# Patient Record
Sex: Female | Born: 1965 | Race: White | Hispanic: No | Smoking: Current every day smoker
Health system: Southern US, Community
[De-identification: ages and names within clinical notes are randomized; demographics above are authoritative.]

## PROBLEM LIST (undated history)

## (undated) DIAGNOSIS — O99810 Abnormal glucose complicating pregnancy: Secondary | ICD-10-CM

## (undated) DIAGNOSIS — I219 Acute myocardial infarction, unspecified: Secondary | ICD-10-CM

## (undated) DIAGNOSIS — M549 Dorsalgia, unspecified: Secondary | ICD-10-CM

## (undated) DIAGNOSIS — F411 Generalized anxiety disorder: Secondary | ICD-10-CM

## (undated) DIAGNOSIS — K219 Gastro-esophageal reflux disease without esophagitis: Secondary | ICD-10-CM

## (undated) DIAGNOSIS — F410 Panic disorder [episodic paroxysmal anxiety] without agoraphobia: Secondary | ICD-10-CM

## (undated) DIAGNOSIS — F17200 Nicotine dependence, unspecified, uncomplicated: Secondary | ICD-10-CM

## (undated) DIAGNOSIS — G8929 Other chronic pain: Secondary | ICD-10-CM

## (undated) DIAGNOSIS — G43909 Migraine, unspecified, not intractable, without status migrainosus: Secondary | ICD-10-CM

## (undated) DIAGNOSIS — E785 Hyperlipidemia, unspecified: Secondary | ICD-10-CM

## (undated) HISTORY — DX: Hyperlipidemia, unspecified: E78.5

## (undated) HISTORY — PX: CHOLECYSTECTOMY: SHX55

## (undated) HISTORY — PX: TONSILLECTOMY: SUR1361

## (undated) HISTORY — DX: Gastro-esophageal reflux disease without esophagitis: K21.9

## (undated) HISTORY — DX: Nicotine dependence, unspecified, uncomplicated: F17.200

## (undated) HISTORY — DX: Generalized anxiety disorder: F41.1

## (undated) HISTORY — DX: Abnormal glucose complicating pregnancy: O99.810

## (undated) HISTORY — PX: RHINOPLASTY: SUR1284

## (undated) HISTORY — DX: Panic disorder (episodic paroxysmal anxiety): F41.0

## (undated) HISTORY — PX: ADENOIDECTOMY: SUR15

---

## 1999-11-29 ENCOUNTER — Emergency Department (HOSPITAL_COMMUNITY): Admission: EM | Admit: 1999-11-29 | Discharge: 1999-11-29 | Payer: Self-pay | Admitting: Emergency Medicine

## 1999-11-29 ENCOUNTER — Encounter: Payer: Self-pay | Admitting: Emergency Medicine

## 2000-09-15 ENCOUNTER — Observation Stay (HOSPITAL_COMMUNITY): Admission: RE | Admit: 2000-09-15 | Discharge: 2000-09-16 | Payer: Self-pay | Admitting: General Surgery

## 2000-09-15 ENCOUNTER — Encounter (INDEPENDENT_AMBULATORY_CARE_PROVIDER_SITE_OTHER): Payer: Self-pay | Admitting: Specialist

## 2002-06-08 ENCOUNTER — Encounter (INDEPENDENT_AMBULATORY_CARE_PROVIDER_SITE_OTHER): Payer: Self-pay | Admitting: *Deleted

## 2002-06-08 ENCOUNTER — Ambulatory Visit (HOSPITAL_COMMUNITY): Admission: AD | Admit: 2002-06-08 | Discharge: 2002-06-08 | Payer: Self-pay | Admitting: *Deleted

## 2002-06-08 ENCOUNTER — Encounter: Payer: Self-pay | Admitting: Obstetrics and Gynecology

## 2002-12-27 DIAGNOSIS — I219 Acute myocardial infarction, unspecified: Secondary | ICD-10-CM

## 2002-12-27 HISTORY — DX: Acute myocardial infarction, unspecified: I21.9

## 2003-09-03 ENCOUNTER — Encounter: Payer: Self-pay | Admitting: Obstetrics and Gynecology

## 2003-09-03 ENCOUNTER — Inpatient Hospital Stay (HOSPITAL_COMMUNITY): Admission: AD | Admit: 2003-09-03 | Discharge: 2003-09-07 | Payer: Self-pay | Admitting: Obstetrics and Gynecology

## 2003-09-04 ENCOUNTER — Encounter (INDEPENDENT_AMBULATORY_CARE_PROVIDER_SITE_OTHER): Payer: Self-pay

## 2003-09-08 ENCOUNTER — Encounter: Admission: RE | Admit: 2003-09-08 | Discharge: 2003-10-08 | Payer: Self-pay | Admitting: Obstetrics and Gynecology

## 2003-09-09 ENCOUNTER — Encounter: Payer: Self-pay | Admitting: Cardiology

## 2003-09-09 ENCOUNTER — Encounter: Payer: Self-pay | Admitting: Obstetrics and Gynecology

## 2003-09-09 ENCOUNTER — Inpatient Hospital Stay (HOSPITAL_COMMUNITY): Admission: AD | Admit: 2003-09-09 | Discharge: 2003-09-10 | Payer: Self-pay | Admitting: Pediatrics

## 2003-11-08 ENCOUNTER — Encounter: Admission: RE | Admit: 2003-11-08 | Discharge: 2003-12-08 | Payer: Self-pay | Admitting: Obstetrics and Gynecology

## 2003-11-12 ENCOUNTER — Other Ambulatory Visit: Admission: RE | Admit: 2003-11-12 | Discharge: 2003-11-12 | Payer: Self-pay | Admitting: Obstetrics and Gynecology

## 2004-01-08 ENCOUNTER — Encounter: Admission: RE | Admit: 2004-01-08 | Discharge: 2004-02-07 | Payer: Self-pay | Admitting: Obstetrics and Gynecology

## 2004-02-08 ENCOUNTER — Encounter: Admission: RE | Admit: 2004-02-08 | Discharge: 2004-03-09 | Payer: Self-pay | Admitting: Obstetrics and Gynecology

## 2004-04-07 ENCOUNTER — Encounter: Admission: RE | Admit: 2004-04-07 | Discharge: 2004-05-07 | Payer: Self-pay | Admitting: Obstetrics and Gynecology

## 2004-06-07 ENCOUNTER — Encounter: Admission: RE | Admit: 2004-06-07 | Discharge: 2004-07-07 | Payer: Self-pay | Admitting: Obstetrics and Gynecology

## 2004-08-07 ENCOUNTER — Encounter: Admission: RE | Admit: 2004-08-07 | Discharge: 2004-09-06 | Payer: Self-pay | Admitting: Obstetrics and Gynecology

## 2004-09-07 ENCOUNTER — Encounter: Admission: RE | Admit: 2004-09-07 | Discharge: 2004-10-07 | Payer: Self-pay | Admitting: Obstetrics and Gynecology

## 2004-11-30 ENCOUNTER — Ambulatory Visit: Payer: Self-pay | Admitting: Family Medicine

## 2004-12-31 ENCOUNTER — Other Ambulatory Visit: Admission: RE | Admit: 2004-12-31 | Discharge: 2004-12-31 | Payer: Self-pay | Admitting: Obstetrics & Gynecology

## 2005-02-01 ENCOUNTER — Ambulatory Visit: Payer: Self-pay | Admitting: *Deleted

## 2005-02-24 ENCOUNTER — Ambulatory Visit: Payer: Self-pay

## 2005-03-11 ENCOUNTER — Ambulatory Visit: Payer: Self-pay | Admitting: Family Medicine

## 2005-04-21 ENCOUNTER — Ambulatory Visit: Payer: Self-pay | Admitting: *Deleted

## 2005-04-30 ENCOUNTER — Encounter (INDEPENDENT_AMBULATORY_CARE_PROVIDER_SITE_OTHER): Payer: Self-pay | Admitting: *Deleted

## 2005-04-30 ENCOUNTER — Inpatient Hospital Stay (HOSPITAL_COMMUNITY): Admission: RE | Admit: 2005-04-30 | Discharge: 2005-05-03 | Payer: Self-pay | Admitting: Obstetrics & Gynecology

## 2005-05-18 ENCOUNTER — Ambulatory Visit: Payer: Self-pay | Admitting: Family Medicine

## 2005-06-02 ENCOUNTER — Other Ambulatory Visit: Admission: RE | Admit: 2005-06-02 | Discharge: 2005-06-02 | Payer: Self-pay | Admitting: Obstetrics & Gynecology

## 2005-10-05 ENCOUNTER — Ambulatory Visit: Payer: Self-pay | Admitting: Family Medicine

## 2006-03-04 ENCOUNTER — Ambulatory Visit: Payer: Self-pay | Admitting: Family Medicine

## 2006-06-06 ENCOUNTER — Ambulatory Visit: Payer: Self-pay | Admitting: Family Medicine

## 2006-07-29 ENCOUNTER — Ambulatory Visit: Payer: Self-pay | Admitting: Family Medicine

## 2006-08-01 ENCOUNTER — Ambulatory Visit: Payer: Self-pay | Admitting: Family Medicine

## 2006-08-17 ENCOUNTER — Emergency Department (HOSPITAL_COMMUNITY): Admission: EM | Admit: 2006-08-17 | Discharge: 2006-08-17 | Payer: Self-pay | Admitting: Family Medicine

## 2006-10-03 ENCOUNTER — Encounter (INDEPENDENT_AMBULATORY_CARE_PROVIDER_SITE_OTHER): Payer: Self-pay | Admitting: Specialist

## 2006-10-03 ENCOUNTER — Ambulatory Visit (HOSPITAL_COMMUNITY): Admission: RE | Admit: 2006-10-03 | Discharge: 2006-10-06 | Payer: Self-pay | Admitting: Obstetrics and Gynecology

## 2006-10-19 ENCOUNTER — Ambulatory Visit: Payer: Self-pay | Admitting: Family Medicine

## 2006-11-09 ENCOUNTER — Ambulatory Visit: Payer: Self-pay | Admitting: Family Medicine

## 2006-12-30 ENCOUNTER — Ambulatory Visit: Payer: Self-pay | Admitting: Family Medicine

## 2007-01-31 ENCOUNTER — Ambulatory Visit: Payer: Self-pay | Admitting: Family Medicine

## 2007-02-19 ENCOUNTER — Emergency Department (HOSPITAL_COMMUNITY): Admission: EM | Admit: 2007-02-19 | Discharge: 2007-02-19 | Payer: Self-pay | Admitting: Emergency Medicine

## 2007-02-23 ENCOUNTER — Ambulatory Visit: Payer: Self-pay | Admitting: Family Medicine

## 2007-05-18 ENCOUNTER — Ambulatory Visit: Payer: Self-pay | Admitting: Pulmonary Disease

## 2007-05-18 LAB — CONVERTED CEMR LAB
ALT: 14 units/L (ref 0–40)
Albumin: 4.2 g/dL (ref 3.5–5.2)
Alkaline Phosphatase: 70 units/L (ref 39–117)
BUN: 9 mg/dL (ref 6–23)
Basophils Absolute: 0.1 10*3/uL (ref 0.0–0.1)
Direct LDL: 122.1 mg/dL
GFR calc non Af Amer: 118 mL/min
HCT: 37.7 % (ref 36.0–46.0)
HDL: 57 mg/dL (ref >39.0)
Hemoglobin: 13 g/dL (ref 12.0–15.0)
Ketones, ur: NEGATIVE mg/dL
Lymphocytes Relative: 43.8 % (ref 12.0–46.0)
Neutro Abs: 3 10*3/uL (ref 1.4–7.7)
Nitrite: NEGATIVE
Potassium: 4.3 meq/L (ref 3.5–5.1)
RBC: 4.12 M/uL (ref 3.87–5.11)
Sodium: 139 meq/L (ref 135–145)
Specific Gravity, Urine: 1.01 (ref 1.000–1.03)
Total Protein, Urine: NEGATIVE mg/dL
Triglycerides: 152 mg/dL — ABNORMAL HIGH (ref 0–149)
VLDL: 30 mg/dL (ref 0–40)
WBC: 6.7 10*3/uL (ref 4.5–10.5)
pH: 6.5 (ref 5.0–8.0)

## 2007-09-16 ENCOUNTER — Emergency Department (HOSPITAL_COMMUNITY): Admission: EM | Admit: 2007-09-16 | Discharge: 2007-09-16 | Payer: Self-pay | Admitting: Family Medicine

## 2007-09-25 ENCOUNTER — Ambulatory Visit: Payer: Self-pay | Admitting: Pulmonary Disease

## 2007-10-10 ENCOUNTER — Emergency Department (HOSPITAL_COMMUNITY): Admission: EM | Admit: 2007-10-10 | Discharge: 2007-10-10 | Payer: Self-pay | Admitting: Emergency Medicine

## 2007-10-17 ENCOUNTER — Ambulatory Visit: Payer: Self-pay | Admitting: Pulmonary Disease

## 2007-10-17 DIAGNOSIS — G43109 Migraine with aura, not intractable, without status migrainosus: Secondary | ICD-10-CM

## 2007-10-17 DIAGNOSIS — F411 Generalized anxiety disorder: Secondary | ICD-10-CM

## 2007-10-17 DIAGNOSIS — F410 Panic disorder [episodic paroxysmal anxiety] without agoraphobia: Secondary | ICD-10-CM | POA: Insufficient documentation

## 2007-11-20 ENCOUNTER — Telehealth (INDEPENDENT_AMBULATORY_CARE_PROVIDER_SITE_OTHER): Payer: Self-pay | Admitting: *Deleted

## 2007-12-05 ENCOUNTER — Telehealth: Payer: Self-pay | Admitting: Pulmonary Disease

## 2007-12-11 ENCOUNTER — Ambulatory Visit: Payer: Self-pay | Admitting: Pulmonary Disease

## 2007-12-11 DIAGNOSIS — R030 Elevated blood-pressure reading, without diagnosis of hypertension: Secondary | ICD-10-CM

## 2007-12-11 DIAGNOSIS — Z8632 Personal history of gestational diabetes: Secondary | ICD-10-CM

## 2007-12-28 HISTORY — PX: ABDOMINAL HYSTERECTOMY: SHX81

## 2008-01-18 ENCOUNTER — Emergency Department (HOSPITAL_COMMUNITY): Admission: EM | Admit: 2008-01-18 | Discharge: 2008-01-18 | Payer: Self-pay | Admitting: Emergency Medicine

## 2008-03-06 ENCOUNTER — Encounter: Payer: Self-pay | Admitting: Pulmonary Disease

## 2008-03-27 ENCOUNTER — Other Ambulatory Visit: Admission: RE | Admit: 2008-03-27 | Discharge: 2008-03-27 | Payer: Self-pay | Admitting: Obstetrics and Gynecology

## 2008-04-01 ENCOUNTER — Telehealth: Payer: Self-pay | Admitting: Pulmonary Disease

## 2008-05-03 ENCOUNTER — Telehealth (INDEPENDENT_AMBULATORY_CARE_PROVIDER_SITE_OTHER): Payer: Self-pay | Admitting: *Deleted

## 2008-05-29 ENCOUNTER — Telehealth (INDEPENDENT_AMBULATORY_CARE_PROVIDER_SITE_OTHER): Payer: Self-pay | Admitting: *Deleted

## 2008-06-17 ENCOUNTER — Ambulatory Visit: Payer: Self-pay | Admitting: Pulmonary Disease

## 2008-06-23 LAB — CONVERTED CEMR LAB
Albumin: 3.9 g/dL (ref 3.5–5.2)
Alkaline Phosphatase: 62 units/L (ref 39–117)
BUN: 8 mg/dL (ref 6–23)
Basophils Absolute: 0.1 10*3/uL (ref 0.0–0.1)
Bilirubin, Direct: 0.1 mg/dL (ref 0.0–0.3)
CO2: 28 meq/L (ref 19–32)
Calcium: 8.8 mg/dL (ref 8.4–10.5)
Eosinophils Absolute: 0.2 10*3/uL (ref 0.0–0.7)
Eosinophils Relative: 3.4 % (ref 0.0–5.0)
Glucose, Bld: 93 mg/dL (ref 70–99)
HCT: 35.6 % — ABNORMAL LOW (ref 36.0–46.0)
Hemoglobin: 12 g/dL (ref 12.0–15.0)
Lymphocytes Relative: 37.3 % (ref 12.0–46.0)
MCV: 93.7 fL (ref 78.0–100.0)
Monocytes Relative: 10.7 % (ref 3.0–12.0)
Neutrophils Relative %: 47.7 % (ref 43.0–77.0)
Sodium: 139 meq/L (ref 135–145)
Total Bilirubin: 0.6 mg/dL (ref 0.3–1.2)
Valproic Acid Lvl: 34.1 ug/mL — ABNORMAL LOW (ref 50.0–100.0)

## 2008-06-24 ENCOUNTER — Telehealth (INDEPENDENT_AMBULATORY_CARE_PROVIDER_SITE_OTHER): Payer: Self-pay | Admitting: *Deleted

## 2008-08-26 ENCOUNTER — Telehealth (INDEPENDENT_AMBULATORY_CARE_PROVIDER_SITE_OTHER): Payer: Self-pay | Admitting: *Deleted

## 2008-09-11 ENCOUNTER — Telehealth: Payer: Self-pay | Admitting: Pulmonary Disease

## 2008-09-12 ENCOUNTER — Telehealth: Payer: Self-pay | Admitting: Pulmonary Disease

## 2008-10-04 ENCOUNTER — Telehealth (INDEPENDENT_AMBULATORY_CARE_PROVIDER_SITE_OTHER): Payer: Self-pay | Admitting: *Deleted

## 2008-11-27 ENCOUNTER — Telehealth (INDEPENDENT_AMBULATORY_CARE_PROVIDER_SITE_OTHER): Payer: Self-pay | Admitting: *Deleted

## 2008-12-02 ENCOUNTER — Telehealth (INDEPENDENT_AMBULATORY_CARE_PROVIDER_SITE_OTHER): Payer: Self-pay | Admitting: *Deleted

## 2009-01-07 ENCOUNTER — Telehealth (INDEPENDENT_AMBULATORY_CARE_PROVIDER_SITE_OTHER): Payer: Self-pay | Admitting: *Deleted

## 2009-02-05 ENCOUNTER — Telehealth (INDEPENDENT_AMBULATORY_CARE_PROVIDER_SITE_OTHER): Payer: Self-pay | Admitting: *Deleted

## 2009-02-21 ENCOUNTER — Ambulatory Visit: Payer: Self-pay | Admitting: Pulmonary Disease

## 2009-02-21 DIAGNOSIS — F17200 Nicotine dependence, unspecified, uncomplicated: Secondary | ICD-10-CM

## 2009-03-03 ENCOUNTER — Telehealth (INDEPENDENT_AMBULATORY_CARE_PROVIDER_SITE_OTHER): Payer: Self-pay | Admitting: *Deleted

## 2009-04-04 ENCOUNTER — Telehealth: Payer: Self-pay | Admitting: Pulmonary Disease

## 2009-04-04 ENCOUNTER — Encounter: Payer: Self-pay | Admitting: Pulmonary Disease

## 2009-04-30 ENCOUNTER — Telehealth (INDEPENDENT_AMBULATORY_CARE_PROVIDER_SITE_OTHER): Payer: Self-pay | Admitting: *Deleted

## 2009-05-04 ENCOUNTER — Emergency Department (HOSPITAL_COMMUNITY): Admission: EM | Admit: 2009-05-04 | Discharge: 2009-05-04 | Payer: Self-pay | Admitting: Emergency Medicine

## 2009-06-13 ENCOUNTER — Telehealth (INDEPENDENT_AMBULATORY_CARE_PROVIDER_SITE_OTHER): Payer: Self-pay | Admitting: *Deleted

## 2009-06-18 ENCOUNTER — Telehealth (INDEPENDENT_AMBULATORY_CARE_PROVIDER_SITE_OTHER): Payer: Self-pay | Admitting: *Deleted

## 2009-07-15 ENCOUNTER — Telehealth (INDEPENDENT_AMBULATORY_CARE_PROVIDER_SITE_OTHER): Payer: Self-pay | Admitting: *Deleted

## 2009-09-02 ENCOUNTER — Encounter: Payer: Self-pay | Admitting: Pulmonary Disease

## 2009-09-03 ENCOUNTER — Telehealth: Payer: Self-pay | Admitting: Pulmonary Disease

## 2009-11-11 ENCOUNTER — Ambulatory Visit: Payer: Self-pay | Admitting: Pulmonary Disease

## 2009-11-16 LAB — CONVERTED CEMR LAB
ALT: 16 units/L (ref 0–35)
AST: 18 units/L (ref 0–37)
Albumin: 3.7 g/dL (ref 3.5–5.2)
BUN: 7 mg/dL (ref 6–23)
Basophils Relative: 1 % (ref 0.0–3.0)
Chloride: 98 meq/L (ref 96–112)
Eosinophils Absolute: 0.2 10*3/uL (ref 0.0–0.7)
Eosinophils Relative: 2.4 % (ref 0.0–5.0)
HCT: 32.3 % — ABNORMAL LOW (ref 36.0–46.0)
Hemoglobin: 11.4 g/dL — ABNORMAL LOW (ref 12.0–15.0)
Lymphocytes Relative: 39 % (ref 12.0–46.0)
Monocytes Absolute: 0.8 10*3/uL (ref 0.1–1.0)
Monocytes Relative: 8.7 % (ref 3.0–12.0)
Platelets: 398 10*3/uL (ref 150.0–400.0)
RBC: 3.24 M/uL — ABNORMAL LOW (ref 3.87–5.11)
RDW: 13.5 % (ref 11.5–14.6)
Sed Rate: 8 mm/hr (ref 0–22)
TSH: 1.77 microintl units/mL (ref 0.35–5.50)
Total Bilirubin: 0.5 mg/dL (ref 0.3–1.2)
Total Protein: 6.5 g/dL (ref 6.0–8.3)
WBC: 9.3 10*3/uL (ref 4.5–10.5)

## 2010-02-28 ENCOUNTER — Emergency Department (HOSPITAL_COMMUNITY): Admission: EM | Admit: 2010-02-28 | Discharge: 2010-03-01 | Payer: Self-pay | Admitting: Emergency Medicine

## 2010-04-03 ENCOUNTER — Telehealth (INDEPENDENT_AMBULATORY_CARE_PROVIDER_SITE_OTHER): Payer: Self-pay | Admitting: *Deleted

## 2010-04-27 ENCOUNTER — Ambulatory Visit: Payer: Self-pay | Admitting: Pulmonary Disease

## 2010-10-16 ENCOUNTER — Telehealth (INDEPENDENT_AMBULATORY_CARE_PROVIDER_SITE_OTHER): Payer: Self-pay | Admitting: *Deleted

## 2010-10-26 ENCOUNTER — Ambulatory Visit: Payer: Self-pay | Admitting: Pulmonary Disease

## 2010-11-26 HISTORY — PX: BILATERAL SALPINGOOPHORECTOMY: SHX1223

## 2010-12-25 ENCOUNTER — Encounter: Payer: Self-pay | Admitting: Obstetrics and Gynecology

## 2010-12-25 ENCOUNTER — Ambulatory Visit (HOSPITAL_COMMUNITY)
Admission: RE | Admit: 2010-12-25 | Discharge: 2010-12-26 | Payer: Self-pay | Source: Home / Self Care | Attending: Obstetrics and Gynecology | Admitting: Obstetrics and Gynecology

## 2011-01-26 NOTE — Assessment & Plan Note (Signed)
Summary: 6 month follow up/la   CC:  6 month follow up--not fasting today--needs refill of soma.  History of Present Illness: 45 y/o WF here for a follow up visit...    ~  seen 12/08 for f/u of her borderline HBP, headaches, & anxiety disorder w/ panic attacks... we refilled Fioricet, Phenergan, and Alprazolam at that time... since then she has f/u w/ DrAdelman et al Mar09 who tried her on Keppra, Baclofen, and Chlorpromazine... she tells me this didn't work and he couldn't help her and suggested that she go to a psychiatrist... she saw DrCunningham of [facility] in April and he started Depakote for migraine prevention and since then she is much improved...   ~  seen Jun09 for a medical follow up and she requests refills for Alprazolam, Soma, and Darvocet... she states that DrCunningham wanted me to write for her nerve pill and he agreed w/ the Rebeca Allegra (although he didn't want to write for it or change it)... she states that she needs the [facility] for muscle spasms in her neck and lower back, and she needs the [facility] for tension headaches and pain... we discussed all of this and I agreed to write for #60 of each- not to exceed 2 per day, and she has agreed not to fill similiar meds from other physicians or at different pharms...   ~  February 21, 2009:  since her last OV her headaches have returned but still not as bad as before thanks to the Depakote from DrCunningham... the DCN100 didn't work and she was changed to [employer] at her request... now she is requesting a change from this to something else & we have settled on VICODIN twice daily Prn (#60/mo not to exceed 2/d)...   ~  November 11, 2009:  late for 4mo ROV required for Rx refills... states she quit smoking 2-3 mo ago, HA's much improved w/ 3,500mg  Depakote Qhs per DrCunningham, but she wants all meds refilled as usual (limited to #60 per month)...  New Problem= hidradenitis right axilla (Rx w/ Doxy + topical Rx)...   ~  Apr 27, 2010:  6  mo ROV to refill meds- states she is doing well w/ the Soma/ Vicodin/ Phenergam for her migraines & still on Depakote from [employer], Psychiatry...  using the Alprazolam 1mg  Bid to prevent panic attacks...  she has lost 12# on diet & exercise program;  BP is improved w/ wt loss;  still not smoking, she says;  & under mod stress w/ divorce...   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  ~  CXR 5/08 showed sl incr markings, NAD.Marland Kitchen.  ~  CXR 11/10 showed similar, NAD...  ELEVATED BP READING WITHOUT DX HYPERTENSION (ICD-796.2) - Hx pulm edema after CSection in 2004 w/ neg spiral CT;  2DEcho 3/06 showed prob redundant MV chordae, trace MR, nl LVF... BP's have been fine here in the office and measures 116/64 today (after her 12# wt loss to 171#)... denies visual changes, CP, palipit, dizziness, syncope, dyspnea, edema, etc...  MIGRAINES, HX OF (ICD-V13.8) - long hx of migraines, mixed headaches, & chr daily HA's... prev managed by [facility] for yrs- she stopped seeing him & is now managed by [facility]- prev DrCunningham, now DrKaur> on Depakote... + SOMA Bid, VICODIN up to Tid, PHENERGAN Prn.  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 PANIC ATTACK (ICD-300.01)  Hx of GESTATIONAL DIABETES (ICD-648.80) GYN =  DrMcPhail now... hx endometriosis w/ shots per DrGottsegen previously... she blames 20# wt gain on the shots that weren't helping her pelvic pain problem anyway so she switched to new GYN in [place]...   Allergies (verified): No Known Drug Allergies  Comments:  Nurse/Medical Assistant: The patient's medications and allergies were reviewed with the patient and were updated in the Medication and Allergy Lists.  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 Father alive, age 12 w/ CABG, hx migraines... Mother alive, age 24 w/ hx asthma, CHF... 1 Sibling- hx migraines as well...  Social History: Reviewed history from 11/11/2009 and no changes required. Married Children Smoker- states she quit 9/10... Social alcohol Unemployed  Review of Systems      See HPI  The patient denies anorexia, fever, weight loss, weight gain, vision loss, decreased hearing, hoarseness, chest pain, syncope, dyspnea on exertion, peripheral edema, prolonged cough, headaches, hemoptysis, abdominal pain, melena, hematochezia, severe indigestion/heartburn, hematuria, incontinence, muscle weakness, suspicious skin lesions, transient blindness, difficulty walking, depression, unusual weight change, abnormal bleeding, enlarged lymph nodes, and angioedema.    Vital Signs:  Patient profile:   45 year old female Height:      65 inches Weight:      170.50 pounds BMI:     28.48 O2 Sat:      100 % on Room air Temp:     98.2 degrees F oral Pulse rate:   92 / minute BP sitting:   116 / 64  (left arm) Cuff size:   regular  Vitals Entered By: Randell Loop CMA (Apr 27, 2010 10:37 AM)  O2 Sat at Rest %:  100 O2 Flow:  Room air CC: 6 month follow up--not fasting today--needs refill of soma Is Patient Diabetic? No Pain Assessment Patient in pain? no      Comments meds updated today   Physical Exam  Additional Exam:  WD WN 43 WF in NAD... GENERAL:  Alert & oriented; pleasant & cooperative... HEENT:  [place]/AT, EOM-wnl, PERRLA, EACs-clear, TMs-wnl, NOSE-clear, THROAT-clear & wnl. NECK:  Supple w/ fairROM; no JVD; normal carotid impulses w/o bruits; no thyromegaly or nodules palpated; no lymphadenopathy. CHEST:   Clear to P & A; without wheezes/ rales/ or rhonchi. HEART:  Regular Rhythm; without murmurs/ rubs/ or gallops. ABDOMEN:  Soft & nontender; normal bowel sounds; no organomegaly or masses detected. EXT: without deformities or arthritic changes; no varicose veins/ venous insuffic/ or edema. NEURO:  CN's intact;  no focal neuro deficits... DERM:  No lesions noted; no rash etc...    Impression & Recommendations:  Problem # 1:  CIGARETTE SMOKER (ICD-305.1) She states that she has remained off cigs sine 9/10...  Problem # 2:  ELEVATED BP READING WITHOUT DX HYPERTENSION (ICD-796.2) BP improved-  no meds, w/ weight reduction...  Problem # 3:  MIGRAINES, HX OF (ICD-V13.8) Controlled on regimen... she wishes to continue the same for now... advised to keep HA diary as before... Orders: Prescription Created Electronically [phone])  Problem # 4:  PANIC ATTACK (ICD-300.01) Controlled on the Alprazolam... Her updated medication list for this problem includes:    Alprazolam 1 Mg Tabs (Alprazolam) .Marland Kitchen... 1 tab twice daily as needed for nerves (not to exceed 2 per day).  Complete Medication List: 1)  Depakote 500 Mg Tbec (Divalproex sodium) .... Take as directed by drcunningham 2)  Soma 350 Mg Tabs (Carisoprodol) .... Take 1 tab by mouth two times a day as needed for muscle spasm- not to exceed 2 per day... 3)  Alprazolam 1 Mg Tabs (Alprazolam) .Marland Kitchen.. 1 tab twice daily as needed for nerves (not to exceed 2 per day). 4)  Hydrocodone-acetaminophen 5-500 Mg Tabs (Hydrocodone-acetaminophen) .... Take 1 tablet by mouth three times per day as needed for pain -  not to exceed 3  per day. 5)  Promethazine Hcl 25 Mg Tabs (Promethazine hcl) .... Take 1 tab by mouth every 8-12 hours as needed for nausea... not to exceed 2 per day.  Patient Instructions: 1)  Today we updated your med list- see below.... 2)  We refilled your meds for the next 6 months... 3)  Great job on [employer] reduction!!! Keep up the good  work... 4)  Congrats on the smoking cessation as well!!! 5)  Call for any problems.Marland KitchenMarland Kitchen 6)  Please schedule a follow-up appointment in 6 months & we will plan follow up CXR & blood work (FASTING) at that time... Prescriptions: PROMETHAZINE HCL 25 MG TABS (PROMETHAZINE HCL) take 1 tab by mouth every 8-12 hours as needed for nausea... not to exceed 2 per day.  #60 x 6   Entered and Authorized by:   Michele Mcalpine MD   Signed by:   Michele Mcalpine MD on 04/27/2010   Method used:   Print then Give to Patient   RxID:   [id number] HYDROCODONE-ACETAMINOPHEN 5-500 MG TABS (HYDROCODONE-ACETAMINOPHEN) take 1 tablet by mouth three times per day as needed for pain -  NOT TO EXCEED 3  PER DAY.  #90 x 6   Entered and Authorized by:   Michele Mcalpine MD   Signed by:   Michele Mcalpine MD on 04/27/2010   Method used:   Print then Give to Patient   RxID:   [id number] ALPRAZOLAM 1 MG  TABS (ALPRAZOLAM) 1 tab twice daily as needed for nerves (not to exceed 2 per day).  #60 x 6   Entered and Authorized by:   Michele Mcalpine MD   Signed by:   Michele Mcalpine MD on 04/27/2010   Method used:   Print then Give to Patient   RxID:   [id number] SOMA 350 MG  TABS (CARISOPRODOL) take 1 tab by mouth two times a day as needed for muscle spasm- not to exceed 2 per day...  #60 x 6   Entered and Authorized by:   Michele Mcalpine MD   Signed by:   Michele Mcalpine MD on 04/27/2010   Method used:   Print then Give to Patient   RxID:   [id number]    Immunization History:  Influenza Immunization History:    Influenza:  historical (12/09/2009)

## 2011-01-26 NOTE — Progress Notes (Signed)
Summary: refill  Phone Note Call from Patient Call back at [phone]   Caller: Patient Call For: nadel Reason for Call: Refill Medication Summary of Call: Need refill on vicodin 5/500.//[facility] [address] Initial call taken by: Darletta Moll,  October 16, 2010 1:37 PM  Follow-up for Phone Call        per sn ok x 1 refill--last refill was 09/19/10--lmom for pt that this med was called in Follow-up by: Annlee Glandon CMA,  October 16, 2010 2:52 PM

## 2011-01-26 NOTE — Progress Notes (Signed)
Summary: MEDICATION CHANGE  Phone Note Call from Patient Call back at [phone]   Caller: Patient Call For: NADEL Summary of Call: PT WOULD  LIKE HYDROCODONE CHANGE BACK TO 90MG   BECAUSE SHE IS GOING THROUGH DIVORCE. Initial call taken by: Rickard Patience,  April 03, 2010 11:13 AM  Follow-up for Phone Call        Marliss Czar, please advise if this is okay from [facility] as pt is monitored for her narcotics. Reynaldo Minium CMA  April 03, 2010 12:03 PM    called and spoke with pt and she is aware per SN---that the vicodin refills from before have been cancelled and the new rx has been sent in for #90 1 three times a day as needed with no refills---and pt made appt for may 2 at 10:30 Randell Loop CMA  April 03, 2010 4:39 PM        New/Updated Medications: HYDROCODONE-ACETAMINOPHEN 5-500 MG TABS (HYDROCODONE-ACETAMINOPHEN) take 1 tablet by mouth three times per day as needed for pain -  NOT TO EXCEED 3  PER DAY. Prescriptions: HYDROCODONE-ACETAMINOPHEN 5-500 MG TABS (HYDROCODONE-ACETAMINOPHEN) take 1 tablet by mouth three times per day as needed for pain -  NOT TO EXCEED 3  PER DAY.  #90 x 0   Entered by:   Randell Loop CMA   Authorized by:   Michele Mcalpine MD   Signed by:   Randell Loop CMA on 04/03/2010   Method used:   Historical   RxID:   [id number]

## 2011-01-26 NOTE — Assessment & Plan Note (Signed)
Summary: 6 months f/u w/ cxr/ fasting labs//kp   CC:  6 month ROV & review> not fasting today and needs meds refilled....  History of Present Illness: 45 y/o WF here for a follow up visit...    ~  seen 12/08 for f/u of her borderline HBP, headaches, & anxiety disorder w/ panic attacks... we refilled Fioricet, Phenergan, and Alprazolam at that time... since then she has f/u w/ DrAdelman et al Mar09 who tried her on Keppra, Baclofen, and Chlorpromazine... she tells me this didn't work and he couldn't help her and suggested that she go to a psychiatrist... she saw DrCunningham of [facility] in April and he started Depakote for migraine prevention and since then she is much improved...   ~  seen Jun09 for a medical follow up and she requests refills for Alprazolam, Soma, and Darvocet... she states that DrCunningham wanted me to write for her nerve pill and he agreed w/ the Rebeca Allegra (although he didn't want to write for it or change it)... she states that she needs the [facility] for muscle spasms in her neck and lower back, and she needs the [facility] for tension headaches and pain... we discussed all of this and I agreed to write for #60 of each- not to exceed 2 per day, and she has agreed not to fill similiar meds from other physicians or at different pharms...   ~  February 21, 2009:  since her last OV her headaches have returned but still not as bad as before thanks to the Depakote from DrCunningham... the DCN100 didn't work and she was changed to [employer] at her request... now she is requesting a change from this to something else & we have settled on VICODIN twice daily Prn (#60/mo not to exceed 2/d)...   ~  November 11, 2009:  late for 59mo ROV required for Rx refills... states she quit smoking 2-3 mo ago, HA's much improved w/ 3,500mg  Depakote Qhs per DrCunningham, but she wants all meds refilled as usual (limited to #60 per month)...  New Problem= hidradenitis right axilla (Rx w/ Doxy + topical  Rx)...   ~  Apr 27, 2010:  6 mo ROV to refill meds- states she is doing well w/ the Soma/ Vicodin/ Phenergan for her migraines & still on Depakote from [employer], Psychiatry...  using the Alprazolam 1mg  Bid to prevent panic attacks...  she has lost 12# on diet & exercise program;  BP is improved w/ wt loss;  still not smoking, she says;  & under mod stress w/ divorce...   ~  October 26, 2010:  59mo ROV & med refills- migraines decr from 2-3/mo to just one in the last 71mo on the Depakote from [facility], & Vicodin, Soma, Phenergan from me (still takes these regularly for her more minor HA pain she says)... she quit smoking 7/11, lost 4# more on diet + walking... BP controlled;  wants to wait til spring for Fasting labs;  refuses flu shot...   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  ~  CXR 5/08 showed sl incr markings, NAD.Marland Kitchen.  ~  CXR 11/10 showed similar, NAD...  ELEVATED BP READING WITHOUT DX HYPERTENSION (ICD-796.2) - Hx pulm edema after CSection in 2004 w/ neg spiral CT;  2DEcho 3/06 showed prob redundant MV chordae, trace MR, nl LVF... BP's have been fine here in the office and measures 122/84 today (after her wt loss to 167#)... denies visual changes, CP, palipit,  dizziness, syncope, dyspnea, edema, etc...  MIGRAINES, HX OF (ICD-V13.8) - long hx of migraines, mixed headaches, & chr daily HA's... prev managed by [facility] for yrs- she stopped seeing him & is now managed by [facility]- prev DrCunningham, now DrKaur> on Depakote... + SOMA Bid, VICODIN up to Tid, PHENERGAN Prn.  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 PANIC ATTACK (ICD-300.01)  Hx of GESTATIONAL DIABETES (ICD-648.80) GYN = DrMcPhail now... hx endometriosis w/ shots per DrGottsegen previously... she blames 20# wt gain on the shots that weren't helping her pelvic pain problem anyway so she switched to  new GYN in [place]...   Preventive Screening-Counseling & Management  Alcohol-Tobacco     Smoking Status: quit     Year Quit: 06/2010  Allergies (verified): No Known Drug Allergies  Comments:  Nurse/Medical Assistant: The patient's medications and allergies were reviewed with the patient and were updated in the Medication and Allergy Lists.  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 Father alive, age 52 w/ CABG, hx migraines... Mother alive, age 59 w/ hx asthma, CHF... 1 Sibling- hx migraines as well...  Social History: Reviewed history from 11/11/2009 and no changes required. Married Children Smoker- states she quit 9/10... Social alcohol Unemployed Smoking Status:  quit  Review of Systems      See HPI  The patient denies anorexia, fever, weight loss, weight gain, vision loss, decreased hearing, hoarseness, chest pain, syncope, dyspnea on exertion, peripheral edema, prolonged cough, headaches, hemoptysis, abdominal pain, melena, hematochezia, severe indigestion/heartburn, hematuria, incontinence, muscle weakness, suspicious skin lesions, transient blindness, difficulty walking, depression, unusual weight change, abnormal bleeding, enlarged lymph nodes, and angioedema.    Vital Signs:  Patient profile:   45 year old female Height:      65 inches Weight:      167.13 pounds BMI:     27.91 O2 Sat:      95 % on Room air Temp:     97.8 degrees F oral Pulse rate:   96 / minute BP sitting:   122 / 84  (right arm) Cuff size:   regular  Vitals Entered By: Randell Loop CMA (October 26, 2010 9:15 AM)  O2 Sat at  Rest %:  95 O2 Flow:  Room air CC: 6 month ROV & review> not fasting today, needs meds refilled... Is Patient Diabetic? No Pain Assessment Patient in pain? no      Comments no changes in meds today   Physical Exam  Additional Exam:  WD WN 44 WF in NAD... GENERAL:  Alert & oriented; pleasant & cooperative... HEENT:  [place]/AT, EOM-wnl, PERRLA, EACs-clear, TMs-wnl, NOSE-clear, THROAT-clear & wnl. NECK:  Supple w/ fairROM; no JVD; normal carotid impulses w/o bruits; no thyromegaly or nodules palpated; no lymphadenopathy. CHEST:  Clear to P & A; without wheezes/ rales/ or rhonchi. HEART:  Regular Rhythm; without murmurs/ rubs/ or gallops. ABDOMEN:  Soft & nontender; normal bowel sounds; no organomegaly or masses detected. EXT: without deformities or arthritic changes; no varicose veins/ venous insuffic/ or edema. NEURO:  CN's intact;  no focal neuro deficits... DERM:  No lesions noted; no rash etc...    Impression & Recommendations:  Problem # 1:  MIGRAINES,  HX OF (ICD-V13.8) She has severe HA problem>  migraines, chre daily HA, etc... migraines decr w/ the depakote per [facility]... chr daily HAs managed w/ Alpraz Bid, Soma Bid, Vicodin Bid, & Prn Phenergan... she wishes to continue as is...  Problem # 2:  CIGARETTE SMOKER (ICD-305.1) She states her last cig was 7/11...  Problem # 3:  ELEVATED BP READING WITHOUT DX HYPERTENSION (ICD-796.2) BP controlled on current Rx for HAs & nerves... she knows to elim sodium etc...  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 Her updated medication list for this problem includes:    Alprazolam 1 Mg Tabs (Alprazolam) .Marland Kitchen... 1 tab twice daily as needed for nerves (not to exceed 2 per day).  Problem # 5:  OTHER MEDICAL PROBLEMS AS NOTED>>>  Complete Medication List: 1)  Depakote 500 Mg Tbec (Divalproex sodium) .... Take as directed by drcunningham 2)  Soma 350 Mg Tabs (Carisoprodol) .... Take 1 tab  by mouth two times a day as needed for muscle spasm- not to exceed 2 per day... 3)  Alprazolam 1 Mg Tabs (Alprazolam) .Marland Kitchen.. 1 tab twice daily as needed for nerves (not to exceed 2 per day). 4)  Hydrocodone-acetaminophen 5-500 Mg Tabs (Hydrocodone-acetaminophen) .... Take 1 tablet by mouth three times per day as needed for pain -  not to exceed 3  per day. 5)  Promethazine Hcl 25 Mg Tabs (Promethazine hcl) .... Take 1 tab by mouth every 8-12 hours as needed for nausea... not to exceed 2 per day.  Patient Instructions: 1)  Today we updated your med list- see below.... 2)  We refilled your perscriptions for the next 88mo... 3)  Let's plan a follow up visit in 88mo w/ CXR & FASTING blood work at that time... Prescriptions: PROMETHAZINE HCL 25 MG TABS (PROMETHAZINE HCL) take 1 tab by mouth every 8-12 hours as needed for nausea... not to exceed 2 per day.  #60 x 6   Entered and Authorized by:   Michele Mcalpine MD   Signed by:   Michele Mcalpine MD on 10/26/2010   Method used:   Print then Give to Patient   RxID:   [id number] HYDROCODONE-ACETAMINOPHEN 5-500 MG TABS (HYDROCODONE-ACETAMINOPHEN) take 1 tablet by mouth three times per day as needed for pain -  NOT TO EXCEED 3  PER DAY.  #90 x 6   Entered and Authorized by:   Michele Mcalpine MD   Signed by:   Michele Mcalpine MD on 10/26/2010   Method used:   Print then Give to Patient   RxID:   [id number] ALPRAZOLAM 1 MG  TABS (ALPRAZOLAM) 1 tab twice daily as needed for nerves (not to exceed 2 per day).  #60 x 6   Entered and Authorized by:   Michele Mcalpine MD   Signed by:   Michele Mcalpine MD on 10/26/2010   Method used:   Print then Give to Patient   RxID:   [id number] SOMA 350 MG  TABS (CARISOPRODOL) take 1 tab by mouth two times a day as needed for muscle spasm- not to exceed 2 per day...  #60 x 6   Entered and Authorized by:   Michele Mcalpine MD   Signed by:   Michele Mcalpine MD on 10/26/2010   Method used:   Print then Give to Patient   RxID:    [id number]

## 2011-03-08 LAB — COMPREHENSIVE METABOLIC PANEL
AST: 29 U/L (ref 0–37)
CO2: 27 mEq/L (ref 19–32)
Calcium: 9.2 mg/dL (ref 8.4–10.5)
Chloride: 106 mEq/L (ref 96–112)
Creatinine, Ser: 0.73 mg/dL (ref 0.4–1.2)
GFR calc non Af Amer: >60 mL/min (ref >60)
Glucose, Bld: 86 mg/dL (ref 70–99)

## 2011-03-08 LAB — CBC
HCT: 32 % — ABNORMAL LOW (ref 36.0–46.0)
HCT: 38.4 % (ref 36.0–46.0)
Hemoglobin: 10.7 g/dL — ABNORMAL LOW (ref 12.0–15.0)
MCH: 34.1 pg — ABNORMAL HIGH (ref 26.0–34.0)
MCH: 34.2 pg — ABNORMAL HIGH (ref 26.0–34.0)
MCV: 102.2 fL — ABNORMAL HIGH (ref 78.0–100.0)
RBC: 3.13 MIL/uL — ABNORMAL LOW (ref 3.87–5.11)
RBC: 3.78 MIL/uL — ABNORMAL LOW (ref 3.87–5.11)
RDW: 13.9 % (ref 11.5–15.5)

## 2011-03-08 LAB — SURGICAL PCR SCREEN: Staphylococcus aureus: NEGATIVE

## 2011-03-19 LAB — COMPREHENSIVE METABOLIC PANEL
AST: 21 U/L (ref 0–37)
Alkaline Phosphatase: 49 U/L (ref 39–117)
CO2: 24 mEq/L (ref 19–32)
Calcium: 8.5 mg/dL (ref 8.4–10.5)
Chloride: 107 mEq/L (ref 96–112)
Creatinine, Ser: 0.73 mg/dL (ref 0.4–1.2)
Glucose, Bld: 69 mg/dL — ABNORMAL LOW (ref 70–99)
Potassium: 3.5 mEq/L (ref 3.5–5.1)
Sodium: 138 mEq/L (ref 135–145)
Total Protein: 6.1 g/dL (ref 6.0–8.3)

## 2011-03-19 LAB — CBC
Hemoglobin: 11.7 g/dL — ABNORMAL LOW (ref 12.0–15.0)
RBC: 3.33 MIL/uL — ABNORMAL LOW (ref 3.87–5.11)

## 2011-03-19 LAB — ETHANOL: Alcohol, Ethyl (B): <5 mg/dL (ref 0–10)

## 2011-03-19 LAB — RAPID URINE DRUG SCREEN, HOSP PERFORMED
Benzodiazepines: POSITIVE — AB
Cocaine: NOT DETECTED

## 2011-03-19 LAB — ACETAMINOPHEN LEVEL: Acetaminophen (Tylenol), Serum: <10 ug/mL — ABNORMAL LOW (ref 10–30)

## 2011-03-19 LAB — DIFFERENTIAL
Basophils Relative: 1 % (ref 0–1)
Eosinophils Absolute: 0.1 10*3/uL (ref 0.0–0.7)
Eosinophils Relative: 1 % (ref 0–5)

## 2011-03-19 LAB — VALPROIC ACID LEVEL: Valproic Acid Lvl: 80.7 ug/mL (ref 50.0–100.0)

## 2011-04-06 LAB — POCT I-STAT, CHEM 8
BUN: 6 mg/dL (ref 6–23)
Creatinine, Ser: 1 mg/dL (ref 0.4–1.2)
Potassium: 4 mEq/L (ref 3.5–5.1)
Sodium: 140 mEq/L (ref 135–145)
TCO2: 25 mmol/L (ref 0–100)

## 2011-04-06 LAB — URINALYSIS, ROUTINE W REFLEX MICROSCOPIC
Glucose, UA: NEGATIVE mg/dL
Hgb urine dipstick: NEGATIVE
Urobilinogen, UA: 1 mg/dL (ref 0.0–1.0)

## 2011-04-06 LAB — VALPROIC ACID LEVEL: Valproic Acid Lvl: 69 ug/mL (ref 50.0–100.0)

## 2011-04-06 LAB — URINE CULTURE: Culture: NO GROWTH

## 2011-04-16 ENCOUNTER — Encounter: Payer: Self-pay | Admitting: Pulmonary Disease

## 2011-04-20 ENCOUNTER — Other Ambulatory Visit (INDEPENDENT_AMBULATORY_CARE_PROVIDER_SITE_OTHER): Payer: 59

## 2011-04-20 ENCOUNTER — Ambulatory Visit (INDEPENDENT_AMBULATORY_CARE_PROVIDER_SITE_OTHER)
Admission: RE | Admit: 2011-04-20 | Discharge: 2011-04-20 | Disposition: A | Discharge status: Home / Self Care | Payer: 59 | Source: Ambulatory Visit | Attending: Pulmonary Disease | Admitting: Pulmonary Disease

## 2011-04-20 ENCOUNTER — Encounter: Payer: Self-pay | Admitting: Pulmonary Disease

## 2011-04-20 ENCOUNTER — Ambulatory Visit (INDEPENDENT_AMBULATORY_CARE_PROVIDER_SITE_OTHER): Payer: 59 | Admitting: Pulmonary Disease

## 2011-04-20 ENCOUNTER — Other Ambulatory Visit (INDEPENDENT_AMBULATORY_CARE_PROVIDER_SITE_OTHER): Payer: 59 | Admitting: Pulmonary Disease

## 2011-04-20 VITALS — BP 110/82 | HR 74 | Temp 97.8°F | Ht 65.0 in | Wt 166.8 lb

## 2011-04-20 DIAGNOSIS — Z87898 Personal history of other specified conditions: Secondary | ICD-10-CM

## 2011-04-20 DIAGNOSIS — Z0000 Encounter for general adult medical examination without abnormal findings: Secondary | ICD-10-CM

## 2011-04-20 DIAGNOSIS — J209 Acute bronchitis, unspecified: Secondary | ICD-10-CM

## 2011-04-20 DIAGNOSIS — E785 Hyperlipidemia, unspecified: Secondary | ICD-10-CM | POA: Insufficient documentation

## 2011-04-20 DIAGNOSIS — F411 Generalized anxiety disorder: Secondary | ICD-10-CM

## 2011-04-20 LAB — LIPID PANEL
Cholesterol: 241 mg/dL — ABNORMAL HIGH (ref 0–200)
HDL: 58.3 mg/dL (ref >39.00)
Total CHOL/HDL Ratio: 4
Triglycerides: 248 mg/dL — ABNORMAL HIGH (ref 0.0–149.0)

## 2011-04-20 LAB — HEPATIC FUNCTION PANEL
Albumin: 3.8 g/dL (ref 3.5–5.2)
Bilirubin, Direct: 0 mg/dL (ref 0.0–0.3)
Total Protein: 6.8 g/dL (ref 6.0–8.3)

## 2011-04-20 LAB — BASIC METABOLIC PANEL
CO2: 30 mEq/L (ref 19–32)
Calcium: 9.3 mg/dL (ref 8.4–10.5)
Creatinine, Ser: 0.6 mg/dL (ref 0.4–1.2)
GFR: 119.76 mL/min (ref >60.00)
Glucose, Bld: 78 mg/dL (ref 70–99)
Sodium: 137 mEq/L (ref 135–145)

## 2011-04-20 LAB — CBC WITH DIFFERENTIAL/PLATELET
Basophils Relative: 0.5 % (ref 0.0–3.0)
HCT: 36.5 % (ref 36.0–46.0)
Hemoglobin: 12.7 g/dL (ref 12.0–15.0)
Lymphocytes Relative: 49.2 % — ABNORMAL HIGH (ref 12.0–46.0)
Lymphs Abs: 4.3 10*3/uL — ABNORMAL HIGH (ref 0.7–4.0)
Monocytes Relative: 9.5 % (ref 3.0–12.0)
Neutro Abs: 3.4 10*3/uL (ref 1.4–7.7)
RBC: 3.62 Mil/uL — ABNORMAL LOW (ref 3.87–5.11)

## 2011-04-20 MED ORDER — AMOXICILLIN-POT CLAVULANATE 875-125 MG PO TABS: 1.0000 | ORAL_TABLET | Freq: Two times a day (BID) | ORAL | Status: AC

## 2011-04-20 MED ORDER — CARISOPRODOL 350 MG PO TABS: 350.0000 mg | ORAL_TABLET | Freq: Two times a day (BID) | ORAL | Status: DC | PRN

## 2011-04-20 MED ORDER — ALPRAZOLAM 1 MG PO TABS: 1.0000 mg | ORAL_TABLET | Freq: Two times a day (BID) | ORAL | Status: DC | PRN

## 2011-04-20 MED ORDER — METHYLPREDNISOLONE ACETATE 80 MG/ML IJ SUSP
80.0000 mg | Freq: Once | INTRAMUSCULAR | Status: AC
  Administered 2011-04-20: 80 mg via INTRA_ARTICULAR

## 2011-04-20 MED ORDER — HYDROCODONE-ACETAMINOPHEN 5-500 MG PO TABS: 1.0000 | ORAL_TABLET | Freq: Three times a day (TID) | ORAL | Status: DC | PRN

## 2011-04-20 NOTE — Progress Notes (Signed)
Subjective:    Patient ID: [name], female    DOB: 1966/01/04, 45 y.o.   MRN: [id number]  HPI 45 y/o WF here for a follow up visit... She has mult medical problems as noted below>  ~  October 26, 2010:  45mo ROV & med refills- migraines decr from 2-3/mo to just one in the last 2766mo on the Depakote from [facility], & Vicodin, Soma, Phenergan from me (still takes these regularly for her more minor HA pain she says)... she quit smoking 7/11, lost 4# more on diet + walking... BP controlled;  wants to wait til spring for Fasting labs;  refuses flu shot...  ~  April 20, 2011:  45mo ROV for med refills & her yearly CPX> c/o sinus infection w/ headache, yellow drainage, congestion etc; states it's gone to her chest w/ bronchitis- cough, thick beige sputum, low grade temp, etc; we discussed Rx w/ Depo/ Augmentin/ Mucinex/ etc...   States she quit smoking about 1 yr ago w/ a promise to her 54 y/o child, weight stable...  She tells me she had a BSO & removal of scar tissue from GYN DrBernardo 12/11...  Due for CXR & fasting blood work today> see below...         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 ~  CXR 5/08 showed sl incr markings, NAD.Marland Kitchen. ~  CXR 11/10 showed similar, NAD.Marland Kitchen. ~  CXR 4/12 showed sl incr markings & NAD...  ELEVATED BP READING WITHOUT DX HYPERTENSION (ICD-796.2) - BP's have been fine here in the office... 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 ~  2DEcho 3/06 showed prob redundant MV chordae, trace MR, nl LVF...  ~   4/12:  BP= 110/82 & denies visual changes, CP, palipit, dizziness, syncope, dyspnea, edema, etc...  HYPERLIPIDEMIA>  We discussed low chol/ low fat diet... ~  FLP 5/08 showed TChol 220, TG 152, HDL 57, LDL 122 ~  FLP 4/12 showed TChol 241, TG 248, HDL 58, LDL 154... prob needs meds but try diet effort first w/ f/u labs in 45mo...  MIGRAINES, HX OF (ICD-V13.8) - long hx of migraines, mixed headaches,  & chr daily HA's> ~  3/09:  f/u w/ DrAdelman et al> they tried her on Keppra, Baclofen, and Chlorpromazine; she tells me these didn't work and he couldn't help her and suggested that she go to a psychiatrist... ~  now managed by [facility] by DrCunningham (later DrKaur)> on Depakote in large doses (HAs much improved on this med)... ~  6/09:  she requests refills for Alprazolam, Soma, and Darvocet... she states that DrCunningham wanted me to write for her nerve pill and he agreed w/ the Alpraz... she states that she needs the [facility] for muscle spasms in her neck and lower back, and she needs the [facility] for tension headaches and pain... we discussed all of this and I agreed to write for #60 of each- not to exceed 2 per day, and she has agreed not to fill similiar meds from other physicians or at different pharms... ~  2/10:  her headaches have returned but still not as bad as before thanks to the Depakote from Psychiatry... the DCN100 didn't work and she was changed to FIORICET at her request... now she is requesting a change from this to something else & we have settled on VICODIN twice daily Prn (#60/mo not to exceed 2/d)... 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 PANIC ATTACK (ICD-300.01)  Hx of GESTATIONAL DIABETES (ICD-648.80) GYN = DrMcPhail now... hx endometriosis w/ shots per DrGottsegen previously... she blames 20# wt gain on the shots that weren't helping her pelvic pain problem anyway so she switched to new GYN in [place]...  HEALTH MAINTENANCE> ~  GI:  She denies GI symptoms... We briefly discussed GI screening w/ colonoscopy around age 74... ~  GYN:  Now sees GYN in [place], advised yearly exam/ PAP/ Mammogram per gyn... ~  Immunizations:  ? Last Tetanus shot... Pt advised to get the yearly season Flu vaccine each  fall...   Past Surgical History  Procedure Date  . Rhinoplasty   . Bilateral salpingoophorectomy 12/11    by DrBernardo    Outpatient Encounter Prescriptions as of 04/20/2011  Medication Sig Dispense Refill  . ALPRAZolam (XANAX) 1 MG tablet Take 1 tablet (1 mg total) by mouth 2 (two) times daily as needed.  60 tablet  5  . carisoprodol (SOMA) 350 MG tablet Take 1 tablet (350 mg total) by mouth 2 (two) times daily as needed. Not to exceed 2 per day  60 tablet  5  . divalproex (DEPAKOTE) 500 MG 24 hr tablet Take 6 tabs by mouth Qhs per DrKaur      . HYDROcodone-acetaminophen (VICODIN) 5-500 MG per tablet Take 1 tablet by mouth every 8 (eight) hours as needed. Not to exceed 3  Per day  60 tablet  5  . amoxicillin-clavulanate (AUGMENTIN) 875-125 MG per tablet Take 1 tablet by mouth 2 (two) times daily.  14 tablet  0   Facility-Administered Encounter Medications as of 04/20/2011  Medication Dose Route Frequency Provider Last Rate Last Dose  . methylPREDNISolone acetate (DEPO-MEDROL) injection 80 mg  80 mg IM Once Michele Mcalpine, MD   80 mg at 04/20/11 1019    No Known Allergies   Review of Systems        See HPI - all other systems neg except as noted... The patient denies anorexia, fever, weight loss, weight gain, vision loss, decreased hearing, hoarseness, chest pain, syncope, dyspnea on exertion, peripheral edema, prolonged cough, headaches, hemoptysis, abdominal pain, melena, hematochezia, severe indigestion/heartburn, hematuria, incontinence, muscle weakness, suspicious skin lesions, transient blindness, difficulty walking, depression, unusual weight change, abnormal bleeding, enlarged lymph nodes, and angioedema.   Objective:   Physical Exam      WD WN 44 WF in NAD... GENERAL:  Alert & oriented; pleasant & cooperative... HEENT:  [place]/AT, EOM-wnl, PERRLA, EACs-clear, TMs-wnl, NOSE-clear, THROAT-clear & wnl. NECK:  Supple w/ fairROM; no JVD; normal carotid impulses w/o bruits; no  thyromegaly or nodules palpated; no lymphadenopathy. CHEST:  Clear to P & A; without wheezes/ rales; scat rhonchi heard... HEART:  Regular Rhythm; without murmurs/ rubs/ or gallops. ABDOMEN:  Soft & nontender; normal bowel sounds; no organomegaly or masses detected. EXT: without deformities or arthritic changes; no varicose veins/ venous insuffic/ or edema. NEURO:  CN's intact;  no focal neuro deficits... DERM:  No lesions noted; no rash etc...   Assessment & Plan:   Ex-smoker/ Acute Bronchitis>  She states she quit smoking 1 yr ago, congrats & stay quit!  CXR shows sl incr markings, no infiltrate etc;  REC> treat bronchitis & sinusitis w/ Depo/ Augmentin/ Mucinex + Fluids/ etc...  Hyperlipidemia>  FLP today looks bad- incr Chol, TG, LDL- but  not seriopus about diet & exercise;  I propose to give her 3mo on diet/ ex/ etc to bring these parameters in line or start meds at that time...  Migraines/ Chr daily HAs/ ?Chr pain syndrome>  She declines to adjust chr pain meds down despite the fact that she only get 1 migraine every 3mo now!!! She requests the Soma350, Vicodin5/500, & Alprazolam1mg  for Bid use every day;  She is warned about prescription drug abuse==> she is not to get similar meds from other physicians, not to use mult pharms, & warned about drug seeking behaviors...  Anxiety/ Panic>  She wants to continue w/ the Alpraz 1mg  as above.Marland KitchenMarland Kitchen

## 2011-04-20 NOTE — Patient Instructions (Signed)
Today we updated your med list in our EPIC system...    We refilled your meds for the next 6 months...  For the Bronchitis & Sinusitis:      We gave you a Depo shot...    We wrote for Augmentin to take twice daily til gone...    Get the OTC MUCINEX 600mg  tabs- 2 tabs twice daily w/ lots of water..    Plus the SALINE nasal mist to spray every 1-2 hours while awake...  Today we did your follow up CXR & fasting blood work...    Please call the [facility] in a few days for your results...    Dial [id number] & when prompted enter your patient number followed by the # symbol...    Your patient number is:  [id number]#  Let's plan another check up in  6 months.Marland KitchenMarland Kitchen

## 2011-04-30 ENCOUNTER — Telehealth: Payer: Self-pay | Admitting: Pulmonary Disease

## 2011-04-30 MED ORDER — MOXIFLOXACIN HCL 400 MG PO TABS: ORAL_TABLET | ORAL | Status: DC

## 2011-04-30 NOTE — Telephone Encounter (Signed)
Spoke w/ pt and advised her of SN recs. Pt verbalized understanding and states she will try the delsym and the mucinex w/ plenty of fluids. Nothing further was needed and abx was sent to pharmacy

## 2011-04-30 NOTE — Telephone Encounter (Signed)
Called and spoke with pt.  She was last seen on 4/24- txed with round of augmentin and depo 80.  Still c/o aches, sweats, and her cough is not improving at all- prod with yellow/green sputum.  Pls advise thanks! No Known Allergies [facility] [address]

## 2011-04-30 NOTE — Telephone Encounter (Signed)
Per SN---needs stronger abx--call in avelox 400mg   #7  1 daily until gone and cont the mucinex 2 po bid, increase fluids, delsym otc 2 tsp bid .  thanks

## 2011-05-07 ENCOUNTER — Telehealth: Payer: Self-pay | Admitting: Pulmonary Disease

## 2011-05-07 MED ORDER — HYDROCODONE-ACETAMINOPHEN 5-500 MG PO TABS: ORAL_TABLET | ORAL | Status: DC

## 2011-05-07 NOTE — Telephone Encounter (Signed)
Spoke with pt.  She states that ever since SN decreased the quantity of her vicodin, she has been taking this 1 bid maintenance to try and keep tension HA away but it is not helping. She states that she has had HA pretty much every day this week.  Wants recs from SN.  Pls advise thanks! No Known Allergies

## 2011-05-07 NOTE — Telephone Encounter (Signed)
Spoke w/ pt and is aware she can increase vicodin to 1 po tid as needed for pain. [facility]oke [facility]/ [facility] [address] (per pt) and advised them of new rx for pt and quantity. Pt is aware if pain is not getting better then need to refer to pain management. Pt verbalized understanding

## 2011-05-07 NOTE — Telephone Encounter (Signed)
Per SN--ok to increase the vicodin to 1 po tid prrn pain #90 monthly- if pain not managed with this we will refer to pain management. thanks

## 2011-05-11 NOTE — Assessment & Plan Note (Signed)
[facility]                             PULMONARY OFFICE NOTE   NAME:[name], [name]                      MRN:          [id number]  DATE:10/17/2007                            DOB:          03-Nov-1966    HISTORY OF PRESENT ILLNESS:  The patient is a 45 year old white female  patient of Dr. Kriste Basque who has a known history of migraines and  generalized anxiety disorder, and presents today for an acute office  visit.  The patient complains that one month ago she fell landing on her  right side.  She complained that she had subsequent right rib and hip  pain.  The patient was seen at the emergency room, which she reports x-  rays and CAT scan were unremarkable.  Those records are unavailable at  today's visit.  The patient reports that she has been using Soma which  has helped some, however, the patient has run out of her refills.  The  patient denies any chest pain, shortness of breath, abdominal pain,  nausea and vomiting, neck pain, cough, fever, or purulent sputum.   PAST MEDICAL HISTORY:  Reviewed.   CURRENT MEDICATIONS:  Reviewed.   PHYSICAL EXAMINATION:  GENERAL:  The patient is a pleasant female in no  acute distress.  VITAL SIGNS:  She is afebrile with stable vital signs.  O2 saturations  is 100% on room air.  HEENT:  Unremarkable.  NECK:  Supple without cervical adenopathy.  No JVD.  LUNGS:  Sounds are clear to auscultation bilaterally without any  wheezing or crackles.  HEART:  S1 and S2 without murmurs, rubs, or gallops.  ABDOMEN:  Soft and nontender.  No palpable hepatosplenomegaly.  No  guarding or rebound noted.  Along the chest wall without any noted  ecchymosis, deformity, or redness.  The patient does have tenderness  along the right mid axillary ribs.  EXTREMITIES:  Warm without any edema.   IMPRESSION:  Right lateral rib pain secondary to fall approximately one  month ago.  The patient is recommended to use anti-inflammatory  with  Motrin 600 mg b.i.d. x7-10 days.  The patient is instructed to take with  food.  The patient may use Skelaxin 800 mg up to three times a day as  needed for muscle spasm.  The patient is to apply warm heat.  Was  offered Tramadol,  however, the patient refused.  The patient is recommended to follow back  up with Dr. Kriste Basque as scheduled.  The patient is to call for sooner  follow-up if symptoms do not improve or worsen.      Rubye Oaks, NP  Electronically Signed      Lonzo Cloud. Kriste Basque, MD  Electronically Signed   TP/MedQ  DD: 10/17/2007  DT: 10/17/2007  Job #: [id number]

## 2011-05-14 NOTE — Op Note (Signed)
[facility]  Patient:    [name], [name] Visit Number: [id number] MRN: [id number]          Service Type: DSU Location: [facility] Attending Physician:  Lendon Colonel Dictated by:   Kathie Rhodes. Kyra Manges, M.D. Proc. Date: 06/08/02 Admit Date:  06/08/2002                             Operative Report  PREOPERATIVE DIAGNOSES:       Incomplete abortion.  POSTOPERATIVE DIAGNOSES:      Incomplete abortion.  OPERATION:                    Suction and curettage.  PROCEDURE:                    The patient was placed in the lithotomy position, prepped and draped in the usual fashion.  Cervix was patent to a #8 suction curette.  I checked using the [facility] dilators and confirmed this.  I placed a suction curette in the uterus and evacuated blood clots and products of conception.  No unusual blood loss occurred.  [name] tolerated this procedure well and was sent to the recovery room in good condition. Dictated by:   S. Kyra Manges, M.D. Attending Physician:  Lendon Colonel DD:  06/08/02 TD:  06/10/02 Job: [id number]

## 2011-05-14 NOTE — Assessment & Plan Note (Signed)
[facility]                                   ON-CALL NOTE   NAME:[name], Regina TESAR                       MRN:          [id number]  DATE:08/17/2006                            DOB:          1966-11-05    ON CALL NOTE:  Patient of Dr. Clent Ridges.   The patient's mother called from a different phone in a different house  stating the patient has not been able to hold anything down since Friday.  They have not called Dr. Clent Ridges.  The patient felt it was her nerves but the  cousin went over to her house last night and stayed with her and felt that  she was so weak this morning she cannot even lift-up her one year old child.  I recommended they take her to the emergency room to be evaluated.                                   Lelon Perla, DO   YRL/MedQ  DD:  08/17/2006  DT:  08/17/2006  Job #:  [id number]   cc:   Tera Mater. Clent Ridges, MD

## 2011-05-14 NOTE — Discharge Summary (Signed)
[name], [name]                ACCOUNT NO.:  [id number]   MEDICAL RECORD NO.:  [id number]           PATIENT TYPE:   LOCATION:                                 FACILITY:   PHYSICIAN:  Dineen Kid. Rana Snare, M.D.         DATE OF BIRTH:   DATE OF ADMISSION:  09/03/2003  DATE OF DISCHARGE:  09/07/2003                                 DISCHARGE SUMMARY   DATE OF ADMISSION:  September 03, 2003   DATE OF DISCHARGE:  September 07, 2003   ADMITTING DIAGNOSES:  1.  Intrauterine pregnancy at 61 and five-sevenths weeks estimated      gestational age.  2.  Induction of labor secondary to nonreassuring fetal heart tones.   DISCHARGE DIAGNOSES:  1.  Status post low transverse cesarean section secondary to nonreassuring      fetal heart tones.  2.  Viable female infant.   PROCEDURE:  Primary low transverse cesarean section.   REASON FOR ADMISSION:  Please see written H&P.   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 The patient was initially sent to [facility] for  observation due to complaints of right upper quadrant pain to rule out  pregnancy-induced hypertension. Laboratory findings were within normal  limits. Ultrasound had been performed which revealed normal amniotic fluid  volume and estimated fetal weight in the 75-90 percentile. During the  monitoring a prolonged episode of bradycardia down to in the 70s was noted  for approximately 4 minutes. This did subsequently resolve. Decision was  made to admit the patient for possible induction of labor and continued  monitoring for observation of fetal heart tones. Pregnancy had been  complicated by gestational diabetes which was diet controlled. The patient  was therefore admitted and electively induced using Cervidil. During the  night there  was an additional episode of fetal deceleration. However, the  following morning fetal heart tones were within normal limits with good beat-  to-beat variability. After initiation of IV Pitocin fetal heart tones again  were found to have a profound deceleration which lasted for approximately 8-  10 minutes. Decision was quickly made to proceed with a cesarean delivery.  The patient was then transferred to the operating room where spinal  anesthesia was administered without difficulty. Low transverse incision was  made with the delivery of a viable female infant weighing 6 pounds 2 ounces  with Apgars of 8 at one minute and 9 at five minutes. Arterial cord pH was  7.13. The infant was noted to have terminal meconium. The patient tolerated  the procedure well and was taken to the recovery room in stable condition.  On postoperative day #1 the patient was without complaint. Vital signs were  stable. Abdomen was soft with good return of bowel function. Abdominal  dressing was noted to be clean, dry and intact. Fundus was firm and  nontender. Laboratory findings revealed hemoglobin of 8.8;  platelet count of  265,000; wbc count of 11.3. On postoperative day #2 the patient was without  complaint. Vital signs were stable. Fundus was firm and nontender. Abdominal  dressing had been removed revealing an incision that is clean, dry and  intact. On postoperative day #3 the patient was without complaint. Vital  signs remained stable. She was afebrile. Fundus was firm and nontender.  Incision was clean, dry and intact. Staples were removed and the patient was  discharged home.   CONDITION ON DISCHARGE:  Good.   DIET:  Regular as tolerated.   ACTIVITY:  No heavy lifting, no driving x2 weeks, no vaginal entry.   FOLLOW-UP:  The patient is to follow up in the office in 1-2 weeks for  incision check. She is to call for temperature greater than 100 degrees,  persistent nausea and vomiting, heavy vaginal  bleeding, and/or redness or  drainage from the incisional site.   DISCHARGE MEDICATIONS:  1.  Tylox #30 one p.o. q.4-6h. p.r.n.  2.  Motrin 600 mg q.6h.  3.  Prenatal vitamins one p.o. daily.  4.  Colace one p.o. daily p.r.n.       CC/MEDQ  D:  06/04/2005  T:  06/04/2005  Job:  [id number]

## 2011-05-14 NOTE — H&P (Signed)
[name], [name]                ACCOUNT NO.:  [id number]   MEDICAL RECORD NO.:  [id number]          PATIENT TYPE:  INP   LOCATION:  NA                            FACILITY:  [facility]   PHYSICIAN:  Freddy Finner, M.D.   DATE OF BIRTH:  [birth year]   DATE OF ADMISSION:  04/30/2005  DATE OF DISCHARGE:                                HISTORY & PHYSICAL   ADMISSION DIAGNOSES:  1.  Intrauterine pregnancy at term.  2.  Surgically scarred uterus.  3.  Previous history of pulmonary edema postpartum with her first delivery.   HISTORY OF PRESENT ILLNESS:  The patient is a 45 year old white married  female, gravida 3, para 1, who delivered by cesarean at term for fetal  distress with her first term pregnancy. Postpartum, she had an episode of  pulmonary edema and extensive evaluation at that time did not reveal  pulmonary embolus or cardiogenic etiology of her edema. It is presumably  secondary to fluid overload related to her delivery. During this pregnancy,  she has been evaluated by Dr. Emilie Rutter. Pulsipher with the [facility]  group. Her most recent evaluation with him was on April 21, 2005; at which  time, she had a normal EKG with a sinus rhythm and a rate of 82. It was his  opinion at that time that she had normal left ventricular function by  echocardiogram and normal EKG. He felt from a cardiac standpoint that she  was well compensated and there was no significant risk regarding her  upcoming cesarean delivery.   REVIEW OF SYMPTOMS:  Negative.   PAST MEDICAL HISTORY:  Further past medical history is recorded in the  prenatal summary and will not be repeated at this time.   PHYSICAL EXAMINATION:  VITAL SIGNS:  Blood pressure in the office was  102/68.  HEENT:  Grossly within normal limits.  HEART:  Normal sinus rhythm without murmurs, gallops, or rubs.  LUNGS:  Clear to auscultation.  ABDOMEN:  Gravid. Fundal height to 38 cm. Fetal heart tone heard in the left  lower quadrant.  PELVIC:  Cervix is 80% effaced, closed, vertex is at a -2 station.  EXTREMITIES:  Without clubbing, cyanosis, or edema.   ASSESSMENT:  1.  Intrauterine pregnancy at term.  2.  Surgical scarred uterus.  3.  Postoperative pulmonary edema following her first cesarean delivery,      possibly secondary to fluid overload.      Possibly secondary to pregnancy induced hypertension, unclear but with      no cardiogenic issues at the present time by cardiology evaluation by      Dr. Emilie Rutter. Pulsipher.   PLAN:  Repeat cesarean delivery. Bilateral tubal ligation at the patient's  request.      WRN/MEDQ  D:  04/29/2005  T:  04/29/2005  Job:  [id number]

## 2011-05-14 NOTE — Op Note (Signed)
[name], [name]                          ACCOUNT NO.:  [id number]   MEDICAL RECORD NO.:  [id number]                   PATIENT TYPE:  INP   LOCATION:  9117                                 FACILITY:  [facility]   PHYSICIAN:  Freddy Finner, M.D.                DATE OF BIRTH:  12/23/1966   DATE OF PROCEDURE:  09/04/2003  DATE OF DISCHARGE:                                 OPERATIVE REPORT   PREOPERATIVE DIAGNOSIS:  Intrauterine pregnancy at term.  Intrapartum fetal  distress.   POSTOPERATIVE DIAGNOSIS:  Intrauterine pregnancy at term. Intrapartum fetal  distress.  No cord abnormality noted, but with a large knuckle of cord  present around the head and shoulder of the baby.   PROCEDURE:  Primary low transverse cesarean section.   SURGEON:  Freddy Finner, M.D.   ASSISTANT:  Guy Sandifer. Henderson Cloud, M.D.   ANESTHESIA:  General endotracheal.   ESTIMATED BLOOD LOSS:  Less than or equal to 800 mL.   COMPLICATIONS:  None.   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 For that reason, she was admitted to the hospital.  That abnormality consisted of approximately six-minute deceleration with  recovery and normal baseline for a period of time thereafter.  She was  admitted and electively induced using Cervidil.  There was a two-minute  deceleration during the night, but on my examination on the morning of  surgery, the fetal heart rate tracing was considered to be normal.  Shortly  thereafter, she had another profound deceleration shortly after initiating  IV Pitocin for induction of labor.  This deceleration lasted for  approximately 8 to 10 minutes.  It had resolved on arrival in the operating  room, but given the recurrent nature of the severe decelerations, it was  elected to proceed with cesarean delivery.  The abdomen was prepped and  draped in the usual sterile  fashion.  Foley catheter was indwelling, having  been placed by the labor and delivery nurse.  General anesthesia was  induced.  A transverse lower abdominal incision was then made and carried  sharply down to fascia. The fascia was entered sharply and extended to the  extent of the skin incision.  The rectus sheath was developed superiorly and  inferiorly with blunt and sharp dissection.  The rectus muscles were divided  in the midline. The peritoneum was entered sharply and extended bluntly to  the extent of the skin incision. A transverse incision was made in the lower  segment and amnion entered. Fluid was clear.  A viable female infant was then  delivered.  Initial attempt was made to use a vacuum extractor and also  muscle tone eventually the right rectus muscle required incision which was  done partially through the muscle for a distance of approximately  4 cm.  The  infant was then delivered without significant difficulty. Apgars were 8 and  9 assigned by the NICU team in attendance. The arterial cord pH was 7.13.  The infant did have terminal meconium.  Placenta and other products of  conception were removed from the uterus. The patient was given 1 gram of  Ancef on clamping of the cord.  The uterus was delivered through the  abdominal incision.  Tubes and ovaries were normal as was the uterus.  The  uterine cavity was confirmed completely evacuated with manual exploration.  The uterine incision was then closed in a single layer with running locking  0 Monocryl.  Two figure-of-eights were required on the lower edge of the  incision for complete hemostasis.  The uterus was delivered back into the  abdominal cavity. Hemostasis was adequate. Irrigation was carried out.  Abdominal incision was then closed in layers. Running 0 Monocryl was used to  close the peritoneum and to reapproximate the rectus muscles at their lower  portion.  The upper portion of the incised right rectus muscle was  anchored  to the fascia with interrupted 0 Monocryl sutures. This allowed  reapproximation of the muscle edge when the fascia was closed.  The fascial  closure was done with running 0 PDS running from angle to angle on either  side.  Subcutaneous tissue was approximated with 2-0 plain running suture.  The skin was closed with wide skin staples and 1/4 inch Steri-Strips.  The  patient tolerated the operative procedure well and was taken to the recovery  room in good condition.                                               Freddy Finner, M.D.    WRN/MEDQ  D:  09/04/2003  T:  09/04/2003  Job:  [id number]

## 2011-05-14 NOTE — Op Note (Signed)
[name], [name]                ACCOUNT NO.:  [id number]   MEDICAL RECORD NO.:  [id number]          PATIENT TYPE:  INP   LOCATION:  9102                          FACILITY:  [facility]   PHYSICIAN:  Freddy Finner, M.D.   DATE OF BIRTH:  August 14, 1966   DATE OF PROCEDURE:  04/30/2005  DATE OF DISCHARGE:                                 OPERATIVE REPORT   PREOPERATIVE DIAGNOSES:  1.  Intrauterine pregnancy at term.  2.  Surgically scarred uterus.  3.  Multiparity, request for sterilization.   POSTOPERATIVE DIAGNOSES:  1.  Intrauterine pregnancy at term.  2.  Surgically scarred uterus.  3.  Multiparity, request for sterilization.   OPERATIVE PROCEDURE:  Repeat low transverse cervical cesarean section with  delivery of viable female infant and bilateral tubal ligation.   SURGEON:  Freddy Finner, M.D.   ANESTHESIA:  Spinal.   ESTIMATED INTRAOPERATIVE BLOOD LOSS:  600 mL.   INTRAOPERATIVE COMPLICATIONS:  None.   Details of the present illness are recorded in the admission note. The  patient was admitted on the morning of surgery. She was brought to the  operating room, there placed under adequate spinal anesthesia, placed in the  dorsal recumbent position with elevation of the right hip by 15 degrees.  Abdomen was prepped in the usual fashion. Foley catheter placed using  sterile technique. Sterile drapes were applied. A low abdominal transverse  incision was made through an old scar and carried sharply down to fascia,  the fascia was entered sharply and extended to the extent of skin incision.  Rectus sheath was developed superiorly and inferiorly with blunt and sharp  dissection. Subcutaneous and subfascial vessels were controlled with the  Bovie. Rectus muscles divided in the midline, peritoneum entered sharply and  extended bluntly and sharply to the extent of skin incision. Transverse  incision was made in the thin lower uterine segment above the bladder  reflection. This was  extended bluntly in a transverse direction. Amniotic  fluid was clear. The initial attempts to deliver the infant were compromised  by scarring and for that reason a small incision was made in the left rectus  muscle and the Kiwi vacuum extractor device used to deliver a viable female  infant without difficulty thereafter. Apgars were 9 and 9. Arterial cord pH  quantity insufficient for testing. Birth weight 6 pounds 7 ounces. The  placenta and other products of conception were removed from the uterus, the  uterus was delivered through the abdominal incision. The uterus, tubes, and  ovaries were normal. The cavity was evacuated and confirmed completely  evacuated by manual expression of the uterine cavity. The uterine incision  was closed in a double layer with running locking 0 Monocryl for the first  layer and an imbricating suture of 0 Monocryl for the second layer. The mid  portion of each tube was elevated, doubly ligated with 0 plain ties, and a  segment of tube excised and the mucosa of the tube distal to the tie  fulgurated with the Bovie. There was some bleeding on the right side  requiring  an additional free tie of 0 Monocryl. A small knuckle of tube was  opened with the Bovie to present hydrosalpinx in this location. This was  distal to the initial ligation. Hemostasis at this point was complete and  uterus was placed back into the abdominal cavity. Irrigation was carried  out. A couple of omental adhesions required lysing to prevent blind loops.  The omentum was adherent to the peritoneum of the lower abdomen. The  abdominal incision was then closed in layers. Running 0 Monocryl was used to  close the peritoneum and reapproximate the rectus muscles, the fascia was  closed with a running 0 PDS, the skin was closed with wide skin staples and  quarter-inch Steri-Strips. The patient tolerated the procedure well and was  taken to the recovery room in good condition.      WRN/MEDQ   D:  04/30/2005  T:  04/30/2005  Job:  [id number]

## 2011-05-14 NOTE — Discharge Summary (Signed)
[name], [name]                ACCOUNT NO.:  [id number]   MEDICAL RECORD NO.:  [id number]          PATIENT TYPE:  INP   LOCATION:  9102                          FACILITY:  [facility]   PHYSICIAN:  Dineen Kid. Rana Snare, M.D.    DATE OF BIRTH:  [birth year]   DATE OF ADMISSION:  04/30/2005  DATE OF DISCHARGE:  05/03/2005                                 DISCHARGE SUMMARY   ADMITTING DIAGNOSES:  1.  Intra-uterine pregnancy at term.  2.  Previous cesarean section, desired repeat.  3.  Multiparity, requests permanent sterilization.   DISCHARGE DIAGNOSES:  1.  Status post low transverse cesarean section.  2.  Viable female infant.  3.  Permanent sterilization.   PROCEDURE:  1.  Repeat low transverse cesarean section.  2.  Bilateral tubal ligation.   REASON FOR ADMISSION:  Please see dictated H&P.   HOSPITAL COURSE:  The patient was a 45 year old white married female,  gravida 3, para 1 who had been previously delivered by cesarean at term for  fetal distress with her first pregnancy. Due to previous cesarean delivery,  the patient desired repeat. Due to multiparity, the patient also had  requested permanent sterilization. On the morning of admission, the patient  was taken to the operating room where spinal anesthesia was administered  without difficulty. A low transverse incision was made with the delivery of  a viable female infant weighing 6 pounds 7 ounces with Apgars of 9 at one  minute and 9 at five minutes.  Bilateral tubal ligation was performed  without difficulty. The patient tolerated the procedure well and was taken  to the recovery room in stable condition. On postoperative day #1, the  patient was without complaint. Vital signs were stable. She was afebrile.  The abdomen soft with good return of bowel function. The fundus was firm and  nontender. Abdominal dressing was noted to be clean, dry and intact.  Laboratory findings revealed a hemoglobin of 10.4.  On postoperative day #2,  the patient was without complaint. Vital signs remained stable. She was  afebrile. Fundus was firm and nontender. Abdominal dressing had been removed  revealing an incision that is clean, dry and intact. She was ambulating  well, tolerating a regular diet without complaints of nausea or vomiting. On  postoperative day #3, the patient was without complaint. Vital signs  remained stable. She was afebrile. Abdomen was soft. Fundus was firm and  nontender. The incision was clean, dry and intact. Staples were removed. The  patient was discharged home.   CONDITION ON DISCHARGE:  Good.   DIET:  Regular as tolerated.   ACTIVITY:  No heavy lifting, no driving x2 weeks, no vaginal entry.   FOLLOW-UP:  The patient to follow up in the office in one week for an  incision check. She is to call for temperature greater than 100 degrees,  persistent nausea and vomiting, heavy vaginal bleeding and/or redness or  drainage from the incisional site.   DISCHARGE MEDICATIONS:  1.  Percocet 5/325, #30, one p.o. every four to six hours p.r.n.  2.  Prenatal vitamins  one p.o. daily.  3.  Colace p.o. daily p.r.n.       CC/MEDQ  D:  06/01/2005  T:  06/01/2005  Job:  [id number]

## 2011-05-14 NOTE — Op Note (Signed)
[name], [name]                ACCOUNT NO.:  [id number]   MEDICAL RECORD NO.:  [id number]          PATIENT TYPE:  INP   LOCATION:  9302                          FACILITY:  [facility]   PHYSICIAN:  Daniel L. Gottsegen, M.D.DATE OF BIRTH:  03-22-1966   DATE OF PROCEDURE:  10/03/2006  DATE OF DISCHARGE:                                 OPERATIVE REPORT   PREOPERATIVE DIAGNOSIS:  Dysmenorrhea, menorrhagia, adenomyosis.   POSTOPERATIVE DIAGNOSES:  1. Dysmenorrhea, menorrhagia, adenomyosis.  2. Endometriosis.   OPERATIONS:  Laparoscopic-assisted vaginal hysterectomy, and cauterization  of endometriosis of right ovary.   SURGEON:  Daniel L. Eda Paschal, M.D.   FIRST ASSISTANT:  Timothy P. Fontaine, M.D.   FINDINGS AT THE TIME OF THE SURGERY:  The patient had omental adhesions to  the anterior peritoneal wall at the site of her previous surgery.  Once  these had been taken down, she had one area of pigmented endometriosis of  her right ovary of about 2-3 mm.  It was superficial and not deep.  There  was no other adhesive disease in the pelvis.  There was no other  endometriosis in the pelvis.  The vesicouterine fold of peritoneum looked  smooth, and it did come down smoothly when it was dissected down. This was  after two previous cesarean sections, and I note after her second cesarean  section that she had dense adhesions at that area which did not appear to be  the case today.   PROCEDURE:  After adequate general anesthesia, the patient was placed in the  dorsal lithotomy position, prepped and draped in the usual sterile manner.  An attempt was made to enter the peritoneal cavity subumbilically by direct  vision with the OptiVu.  A small vertical incision was made.  The fascia,  however, was very firm and scarred as a result of her previous laparoscopic  procedures, including a laparoscopic cholecystectomy, so his was abandoned,  and there was concern that to exert enough pressure to get  through we would  injure the retroperitoneal vessels. Instead, the skin was elevated.  A  Veress needle was introduced.  A saline drop was put on the end of the  needle, and it showed that the peritoneal cavity had been entered without  difficulty, and then a pneumoperitoneum was created without any problems.  The needle was then removed.  The incision was extended a little bit, and  then a trocar could be inserted. A blunt tip trocar was utilized.  The  diagnostic scope was placed.  Two 5 mm ports were placed in the pelvis. The  omental adhesions were taken down with the Gyrus without difficulty and  without any bleeding, and then the procedure was started. The round  ligaments were bipolared and cut, and the vesicouterine fold of peritoneum  could be sharply dissected without any difficulty and without encountering  any true problem. The utero-ovarian ligaments and fallopian tubes were then  bipolared and cut. The posterior peritoneum was taken down, and the uterus  was now free. The procedure was then finished vaginally.  The surgeon went  below,  and 1:200,000 solution of epinephrine and 0.5% Xylocaine was injected  around the cervix.  A 360-degree incision was made around the cervix.  The  bladder was mobilized superiorly, as was the posterior peritoneum.  The  posterior peritoneum and vesicouterine fold of peritoneum were entered with  sharp dissection.  The uterosacral ligaments were clamped. In clamping them,  they were shortened.  There were then sutured to the vault laterally for  good vault support.  The cardinal ligaments and the uterine arteries were  all clamped, cut, and suture ligated. All major vascular bundles were doubly  ligated.  The uterus was delivered and sent to pathology for tissue  diagnosis.  Angle sutures were placed with #1 chromic catgut, and then the  cuff and peritoneum was closed with single layer with interrupteds of #1  chromic catgut.  Prior to doing  this, the peritoneum had been sutured with 0  Vicryl to the cuff in a 360-degree circumferential way to control bleeding,  and a 3-0 Vicryl modified McCall's suture was also placed.  Attention was  next turned back laparoscopically. The surgeons regloved.  Copious  irrigation was done. There was one area of ooze that was controlled with  bipolar. The area of endometriosis on the right ovary was bipolared and  completely excised. The pneumoperitoneum was evacuated.  Two sponge, needle,  and instrument counts were correct.  The fascial incision subumbilically was  closed with 0 Vicryl, and all skin incisions were closed with 3-0 Vicryl.  The patient tolerated the procedure well and left the operating room in  satisfactory condition.      Daniel L. Eda Paschal, M.D.  Electronically Signed     DLG/MEDQ  D:  10/03/2006  T:  10/04/2006  Job:  [id number]

## 2011-05-14 NOTE — H&P (Signed)
NAMEJUDEEN, [name]                ACCOUNT NO.:  [id number]   MEDICAL RECORD NO.:  [id number]          PATIENT TYPE:  AMB   LOCATION:  SDC                           FACILITY:  [facility]   PHYSICIAN:  Daniel L. Gottsegen, M.D.DATE OF BIRTH:  07-05-1966   DATE OF ADMISSION:  10/03/2006  DATE OF DISCHARGE:                                HISTORY & PHYSICAL   CHIEF COMPLAINT:  Menometrorrhagia with dysmenorrhea.   HISTORY OF PRESENT ILLNESS:  The patient is a 45 year old gravida 3, para 2,  AB1 who came to see me with a history of periods that are coming every 2  weeks associated with dysmenorrhea, associated with very heavy bleeding.  She has tried medical solutions for the above without success.  She has had  a sterilization procedure done. She took birth control pills for a while but  when she was on birth control pills her periods got worse in spite of them  and it also aggravated her migraines.  A saline infusion histogram was done  in the office without history and on saline infusion histogram she had no  intrauterine pathology and you could see no evidence of fibroids or other  disease.  Her operative note from when she had her cesarean section talks  about severe scarring at the vascular uterine fold perineum.  Adenomyosis is  strongly suspected. She now enters the hospital for a laparoscopic assisted  vaginal hysterectomy for assessment of the pelvis and because of the concern  about the bladder flap and also to look for endometriosis.  Our plan is to  save both her ovaries unless unusual disease is found. Again on ultrasound  there was no evidence of any.  She has given me permission to remove one or  both ovaries for disease.   PAST MEDICAL HISTORY:  The patient had two cesarean sections.  She is  treated for migraines.   MEDICATIONS:  1. Soma.  2. Xanax.  3. Fioricet.   ALLERGIES:  She is allergic to no drugs.   FAMILY HISTORY:  Reveals that her mother has both coronary  artery disease  and hypertension.  She has a paternal aunt who has had uterine cancer.   SOCIAL HISTORY:  She occasionally smokes but does not use alcohol.   PHYSICAL EXAMINATION:  GENERAL:  She is a well-developed, well-nourished  female in no acute distress.  VITALS:  Her blood pressure is 112/70, pulse is 80 and irregular.  Respirations 16 and unlabored.  She is afebrile.  HEENT:  All within normal limits.  NECK:  Supple. Trachea is midline.  Thyroid is not enlarged.  LUNGS:  Clear to P and A.  HEART:  No thrills, heaves or murmur.  BREASTS:  No masses.  ABDOMEN:  Soft without guarding, rebound or masses.  PELVIC:  External is normal.  BUS is normal.  Vagina is normal.  Cervix is  clean. Uterus is anteverted, normal size and shape.  Adnexa failed to reveal  masses.  Rectovaginal is negative.   ADMISSION IMPRESSION:  Menorrhea, dysmenorrhea, adenomyosis strongly  suspected.   PLAN:  LAVH.  We also discussed that conceivably we would leave the cervix  pending how difficult it is to free the bladder from the uterus.      Daniel L. Eda Paschal, M.D.  Electronically Signed     DLG/MEDQ  D:  10/03/2006  T:  10/03/2006  Job:  [id number]

## 2011-05-14 NOTE — Assessment & Plan Note (Signed)
[facility]                                 ON-CALL NOTE   NAME:[name], Peed                        MRN:          [id number]  DATE:10/05/2008                            DOB:          Mar 26, 1966    [name] called earlier in the evening in no apparent distress.  She  had been prescribed Darvocet and had gone to the pharmacy to try to have  Fioricet filled.  They would not do it because of an outstanding  quantity of Darvocet that was still available on her present  prescription.  I told her that I would pass this note on to Dr. Kriste Basque,  also instructed to come to the emergency room if she had no difficulty.  I had to call her back later to confirm her spelling of her name and she  seemed to be in no distress at that time.  Again, I confirmed come to  the emergency room if she had difficulty.     [name], MD  Electronically Signed    Clemetine Marker  DD: 10/05/2008  DT: 10/06/2008  Job #: [id number]

## 2011-05-14 NOTE — Consult Note (Signed)
[name], [name]                          ACCOUNT NO.:  [id number]   MEDICAL RECORD NO.:  [id number]                   PATIENT TYPE:  INP   LOCATION:  9371                                 FACILITY:  [facility]   PHYSICIAN:  Carole Binning, M.D. [facility]         DATE OF BIRTH:  Jun 28, 1966   DATE OF CONSULTATION:  09/09/2003  DATE OF DISCHARGE:                                   CONSULTATION   REASON FOR CONSULTATION:  Acute pulmonary edema in a 45 year old postpartum  patient.   HISTORY OF PRESENT ILLNESS:  [name] is a very pleasant 45 year old woman  who is G2, P1, A1.  She recently underwent a cesarean section on September 04, 2003, for fetal distress.  Her pregnancy was complicated by mild  gestational diabetes which were controlled with diet and gestational  hypertension near the end of the pregnancy.  Two months ago she noted mild  lower extremity edema but was otherwise doing well.  She did not have any  symptoms of dyspnea or chest pain prior to delivery.  On September 04, 2003,  she underwent an uncomplicated cesarean section.  She was discharged home  two days ago.  However, the patient admits that at the time of discharge she  was mildly short of breath.  Saturday evening, which were the night after  discharge, she noticed some wheezing.  Yesterday she became progressively  short of breath throughout the day.  She called and spoke to a nurse and was  actually advised by her physician to present to the hospital for evaluation;  however, did not present until this morning.  During this interval she  became progressively short of breath.  She was severely orthopneic,  requiring multiple pillows to elevate the head of her bed, but at the point  of admission she was dyspneic with just talking.  On presentation she was  found to be in acute pulmonary edema.  She was given intravenous Lasix with  an excellent diuresis of 2800 mL.  Since then her shortness of breath has  resolved,  and she feels back to baseline.  She did have a chest x-ray which  showed interstitial edema with findings which were felt to be consistent  with noncardiogenic pulmonary edema.  She also had a chest CT scan which  showed bilateral pleural effusions and evidence of interstitial edema, no  evidence of pulmonary embolism.  Because of the acute onset of pulmonary  edema postdelivery, cardiology was consulted.   The patient also described some left shoulder pain and left scapular pain on  presentation which has hence resolved.  She denies any chest pain.  She  denies any prior known cardiac history or prior history of exertional  dyspnea, orthopnea, or PND.  She did have edema as described above.  Her  cardiac risk factors are positive for mild hyperlipidemia, previous tobacco  use, and family history of premature coronary artery  disease.  She also had  gestational hypertension and gestational diabetes as above.   PAST MEDICAL HISTORY MEDICATIONS SOCIAL HISTORY FAMILY HISTORY, AND REVIEW  OF SYSTEMS:  Are as documented in the physician assistant's consultation  note.   PHYSICAL EXAMINATION:  GENERAL:  This is a well-appearing woman in no acute  distress.  VITAL SIGNS:  Temperature 97.8, pulse 105 and regular, blood pressure  146/86, oxygen saturation on room air is 97%, respirations 20.  SKIN:  Warm and dry.  Without generalized rash.  HEENT:  Normocephalic, atraumatic.  Sclerae anicteric.  Oral mucosa  unremarkable.  NECK:  No adenopathy or thyromegaly.  No JVD.  Carotid upstroke is normal,  without bruit.  CHEST:  Clear to percussion and auscultation at this point.  CARDIAC:  PMI is nonpalpable.  There is a regular rate and rhythm.  Positive  S4.  Normal S1 and S2, with a very soft systolic ejection murmur along the  left sternal border.  ABDOMEN:  Protuberant, soft, nontender.  Normal bowel sounds.  EXTREMITIES:  With 1+ ankle edema.  No clubbing or cyanosis.  Peripheral  pulses are  2+ throughout.   EKG from today shows sinus tachycardia, rate of 101, otherwise normal.   Other laboratory data is noted in the chart.  There are no cardiac enzymes  or B-type natriuretic peptide available at this time.   Chest x-ray finding and CT findings are as described.   The patient had an echocardiogram done this afternoon.  This was interpreted  by Dr. Myrtis Ser, and per his report this shows normal left ventricular systolic  function and no significant valvular disease.  In essence, echocardiogram  was a normal study.   ASSESSMENT:  The patient is a 45 year old woman who presents with acute  pulmonary edema following a cesarean section.  Chest x-ray findings were  felt to be compatible with noncardiogenic pulmonary edema.  Echocardiogram  shows normal left ventricular function and no significant valvular disease,  also suggesting that her pulmonary edema is noncardiogenic in etiology.  Nevertheless, she does have cardiovascular risk factors, and we should rule  out potential ischemic etiology.   PLAN:  1. We will check serial cardiac markers to rule out myocardial ischemia.  We     will also obtain serial EKGs.  2. We will check a B-type natriuretic peptide to rule out a cardiac etiology     to her pulmonary edema.  3. Regarding further treatment at this point, it appears that clinically her     pulmonary edema is resolved as she is comfortable and has an oxygen     saturation of 97% on room air.  Will thus withhold further diuretic     therapy at this time.  4. As the patient has had significant diuresis, would recommend following     her potassium level and replacement of that as indicated.   Thank you for consulting on this pleasant patient.  Further recommendations  will be based on the results of the above studies.                                               Carole Binning, M.D. [facility]    MWP/MEDQ  D:  09/09/2003  T:  09/09/2003  Job:  [id number]

## 2011-05-14 NOTE — Discharge Summary (Signed)
NAMEGENEAL, HUEBERT                          ACCOUNT NO.:  [id number]   MEDICAL RECORD NO.:  [id number]                   PATIENT TYPE:  INP   LOCATION:  9371                                 FACILITY:   PHYSICIAN:  Dineen Kid. Rana Snare, M.D.                 DATE OF BIRTH:  1966-02-28   DATE OF ADMISSION:  09/09/2003  DATE OF DISCHARGE:  09/10/2003                                 DISCHARGE SUMMARY   DISCHARGE DIAGNOSES:  1. Progressive dyspnea/volume overload/left shoulder pain in postpartum     female.  Ruled out for myocardial infarction.  Ruled out for pulmonary     embolism.  2. Successful diuresis with IV Lasix.  3. Status post cesarean section September 04, 2003.  She is a G2,P1     discharged originally September 11.  4. History of gestational  diabetes, now resolved.  5. Mild hypertension with systolic blood pressure in the 140s at discharge.     No prior history of elevated blood pressure.   SECONDARY DIAGNOSES:  1. History of migraines.  2. History of panic attacks.  3. Family history of coronary artery disease.  4. Status post rhinoplasty.   PROCEDURE:  1. Computed tomogram of the chest with contrast showing no evidence of     pulmonary embolus done September 13.  Also, 2-D echocardiogram September     13 showing normal left ventricular function.  No valvular disease with     reading of normal echo.  2. Cardiac enzymes x2 which were negative.   DISCHARGE DISPOSITION:  Ms.  Shereka Lafortune is ready for discharge September  14.  She was admitted September 13 with progressive dyspnea, left shoulder  pain.  Chest x-ray suggestive for pulmonary edema.  She was given IV Lasix  and diuresed 2600 ml.  Negative in the last 24 hours.  She has lost five  pounds in the last 24 hours.  Her breathing is much better.  She is on room  air achieving 97%  oxygen saturation.  Her lungs are clear.  She did have 3-  4+ pitting edema in the lower extremities.  This is now 1-2+ edema on  September 14.  Relatively ready to go home with the following plan.   PLAN:  She will take blood pressures using her mother's blood pressure  machine to ascertain if her blood pressure which is now 140 systolic returns  to its prepregnancy levels in the 110s to 120s systolic.  She was also  counseled to take her daily weight and to call the office if she gains more  than four pounds in the next or if she becomes short of breath.  Telephone  number [phone] will be given to her.  The patient is on no medications  prior to this admission except those for migraine headaches.   BRIEF HISTORY:  Ms. Cassity Christian is a 45 year old female G2,P1 delivering at  term by cesarean section September 04, 2003 for fetal distress, uncomplicated  and discharged September 11.  She called back September 12 in the evening  saying that she was short of breath and arrived at [facility] at 6  o'clock on the morning of September 13 complaining of increased dyspnea with  left shoulder pain.  Oxygen saturation was 96% on room air.  Chest x-ray  showed bilateral pleural effusions.  Spiral CT was ordered showing no  evidence of pulmonary embolism, but with interstitial fluid and bilateral  pleural effusions.  The patient says she was having mild shortness of breath  on discharge, but this became progressively and she has had generalized  edema especially within the lower extremities.  She also had onset of left  shoulder pain with her shortness of breath.  The patient was admitted and  given IV Lasix with immediate expression of 2800 ml of urine.  The patient  denies any prior history of congestive heart failure and has not had any  significant dyspnea during her pregnancy.  She denies any history of prior  chest pain.  She, once again, reports worsening shortness of breath  since  her delivery on September 8 and has developed orthopnea, paroxysmal  nocturnal dyspnea, increasing lower extremity edema and dyspnea  on exertion.  After IV Lasix, the patient reports significant decrease in shortness of  breath  and some relief in her edema.   HOSPITAL COURSE:  After admission with CT of the chest to rule out pulmonary  embolism and serial enzymes to rule out myocardial infarction both negative,  the patient has done well with IV Lasix only 20 mg IV.  Her weight  change  has been five pounds in the last 24 hours with marked improvement in her  respiratory status.  BNP was 164.  She is suitable candidate for discharge  September 14.  She is once again counseled to watch her blood pressure for  return to normalcy.  This subject will be taken up at her office visit with  Dr. Gerri Spore.  Also, to watch her weight and to call the office if she  gains more than three to four pounds or becomes more short of breath.  She  has an office visit with Dr. Gerri Spore Monday October 07, 2003 at 11:30 in  the morning.   DISCHARGE LABORATORIES:  BMET on September 14, the day of discharge.  Sodium  139, potassium 3.9, chloride 108, carbonate 25, BUN 9, creatinine 0.7,  glucose 78, alk phos 92, SGOT 21, SGPT less than 19.  BNP was 164.  Serial  enzymes were taken September 13 at 8 o'clock in the evening.  CK was 71, CK-  MB 3, troponin I 0.01.  On September 14 at 0440 CK was 53, CK-MB 2.2,  troponin I less than 0.01.  Urinalysis was negative except for large amount  of urine hemoglobin.  Blood count on admission.  White cells 10, hemoglobin  9, hematocrit 26.1, platelets 387.  TSH was pending on the morning of  September 14.      Maple Mirza, P.A.                    Dineen Kid Rana Snare, M.D.    GM/MEDQ  D:  09/10/2003  T:  09/10/2003  Job:  [id number]   cc:   Carole Binning, M.D. [facility]

## 2011-05-14 NOTE — Discharge Summary (Signed)
[name], [name]                ACCOUNT NO.:  [id number]   MEDICAL RECORD NO.:  [id number]          PATIENT TYPE:  INP   LOCATION:  9302                          FACILITY:  [facility]   PHYSICIAN:  Daniel L. Gottsegen, M.D.DATE OF BIRTH:  08/16/1966   DATE OF ADMISSION:  10/03/2006  DATE OF DISCHARGE:  10/06/2006                                 DISCHARGE SUMMARY   The patient is a 45 year old female who was admitted to the hospital with  progressive incapacitating menorrhagia and dysmenorrhea for definitive  surgery.  On the day of admission, she was taken to the operating room.  A  laparoscopic-assisted vaginal hysterectomy and excision of endometriosis of  the right ovary was performed.  Postoperatively, she developed an ileus.  It  was associated with nausea, vomiting, no flatus and diminished bowel sounds.  She was treated with IV fluids, Dulcolax suppository, and it did resolve,  though it resolved somewhat slowly.  By third postoperative day, she was now  passing gas.  She was no longer nauseated or vomiting.  She was discharged  on Tylox for pain medication.  She will be seen in our office in 4 weeks.  Condition on discharge is improved.   DISCHARGE DIAGNOSES:  1. Dysmenorrhea.  2. Menorrhagia.  3. Endometriosis.   OPERATION:  Laparoscopic-assisted vaginal hysterectomy, excision of  endometriosis of right ovary.      Daniel L. Eda Paschal, M.D.  Electronically Signed     DLG/MEDQ  D:  10/06/2006  T:  10/07/2006  Job:  [id number]

## 2011-05-14 NOTE — Op Note (Signed)
[facility]  Patient:    [name], [name]                     MRN: [id number] Proc. Date: 09/15/00 Adm. Date:  [id number] Attending:  Brandy Hale CC:         Louanna Raw, M.D., [facility]   Operative Report  PREOPERATIVE DIAGNOSES:  Chronic cholecystitis with cholelithiasis.  POSTOPERATIVE DIAGNOSES:  Chronic cholecystitis with cholelithiasis.  OPERATION PERFORMED:  Laparoscopic cholecystectomy.  SURGEON:  Dr. Claud Kelp.  FIRST ASSISTANT:  Dr. Lorelee New.  INDICATIONS FOR PROCEDURE:  This is a 45 year old white female with a 2 year history of intermittent episodes of epigastric pain, nausea and vomiting. More recently, these episodes of pain are occurring after meals and especially recently after fatty meals. She will be asymptomatic between episodes. She was recently evaluated after a severe episode. Gallbladder ultrasound shows numerous gallstones and/or polyps. There was no wall thickening. She was referred to [facility] gastroenterology and they referred to me stating that she needed a cholecystectomy. Liver function tests are normal. CBC is normal. It is felt that she most likely has gallstones and fairly typical biliary colic. Cholecystectomy was advised and she is brought to the operating room electively.  OPERATIVE FINDINGS:  The gallbladder was somewhat discolored but thin walled. The anatomy of the cystic duct, cystic artery and common bile duct were conventional. The liver appeared healthy. The stomach and duodenum appeared healthy. The large intestine and small intestine and peritoneal surfaces were grossly normal to inspection.  TECHNIQUE:  Following the induction of general endotracheal anesthesia, the patients abdomen was prepped and draped in the usual sterile fashion. A 0.5% Marcaine with epinephrine was used as a local infiltration anesthetic. A vertical incision was made inside the lower rim of  the umbilicus. The fascia was incised in the midline and the abdominal cavity entered under direct vision. A 10 mm Hasson trocar was inserted and secured with a pursestring suture of #0 Vicryl. Pneumoperitoneum was created. A video camera was inserted with visualization and findings as described above. The 10 mm trocar was placed in the subxiphoid region and two 5 mm trocars placed in the right mid abdomen. The gallbladder was placed on traction. A few adhesions were taken down near the infundibulum. We dissected out the cystic duct, cystic artery. Both of these structures were small in caliber and quite long. We controlled the cystic artery as it went on to the gallbladder wall by controlling it with metal clips and dividing it. We dissected out a good length of the cystic duct and we clearly visualized the junction of the cystic duct with the infundibulum of the gallbladder and clearly identified the common bile duct. The cystic ducts were then secured with metal clips and divided. The gallbladder was then dissected from its bed with its electrocautery and removed through the umbilical port. The gallbladder dissection was uneventful. Hemostasis was excellent and achieved with electrocautery. At the completion of the case, there was no bleeding and no bile leak whatsoever. The operative field was irrigated. The irrigation of fluid was completely clear. The trocars were removed under direct vision. There was no bleeding from the trocar sites. Once the pneumoperitoneum was evacuated, the fascia and the umbilicus was closed with #0 Vicryl suture. The skin incisions were closed with subcuticular sutures of 4-0 Vicryl and Steri-Strips. Clean bandages were placed and the patient taken to the recovery room in stable condition. Estimated blood loss was  about 10 cc, complications none. Sponge, needle and instrument counts were correct were correct. DD:  09/15/00 TD:  09/16/00 Job:  [id number]

## 2011-09-07 ENCOUNTER — Telehealth: Payer: Self-pay | Admitting: Pulmonary Disease

## 2011-09-07 DIAGNOSIS — G44009 Cluster headache syndrome, unspecified, not intractable: Secondary | ICD-10-CM

## 2011-09-07 NOTE — Telephone Encounter (Signed)
I spoke with pt and she states her headaches are getting worse x 2 weeks. Pt states she did experience a migraine 2 weeks ago as well and now feels like her headaches are clustering. Pt is wanting to be referred to [facility]. Please advise if okay Dr. Kriste Basque. Thanks  Carver Fila, CMA

## 2011-09-07 NOTE — Telephone Encounter (Signed)
Order has been sent and pt aware

## 2011-09-16 ENCOUNTER — Telehealth: Payer: Self-pay | Admitting: Pulmonary Disease

## 2011-09-16 NOTE — Telephone Encounter (Signed)
The pt says she was seen by Dr. Vear Clock at [facility] yesterday, 9/19 and will have a f/u visit on 10/10. She says that Dr. Vear Clock office told her to call SN to see if he would refill her vicodin until she is seen back on 10/10. Pls advise.

## 2011-09-17 MED ORDER — HYDROCODONE-ACETAMINOPHEN 5-500 MG PO TABS: ORAL_TABLET | ORAL | Status: DC

## 2011-09-17 NOTE — Telephone Encounter (Signed)
Per SN okay to call in vicodin 5-500 #50 tablet 1 tid no more than 3 per day with no refills. Pt aware. Rx called to [facility] on [address].

## 2011-10-07 LAB — POCT I-STAT CREATININE
Creatinine, Ser: 0.9
Operator id: 196461

## 2011-10-07 LAB — I-STAT 8, (EC8 V) (CONVERTED LAB)
Acid-base deficit: 2
HCT: 39
Hemoglobin: 13.3
Operator id: 196461
Potassium: 3.9
Sodium: 139
TCO2: 24

## 2011-10-19 ENCOUNTER — Ambulatory Visit: Payer: 59 | Admitting: Pulmonary Disease

## 2011-11-05 ENCOUNTER — Other Ambulatory Visit: Payer: Self-pay | Admitting: Pulmonary Disease

## 2011-12-08 ENCOUNTER — Ambulatory Visit (INDEPENDENT_AMBULATORY_CARE_PROVIDER_SITE_OTHER): Payer: 59

## 2011-12-08 DIAGNOSIS — G43009 Migraine without aura, not intractable, without status migrainosus: Secondary | ICD-10-CM

## 2012-04-23 ENCOUNTER — Encounter (HOSPITAL_COMMUNITY): Payer: Self-pay

## 2012-04-23 ENCOUNTER — Emergency Department (HOSPITAL_COMMUNITY)
Admission: EM | Admit: 2012-04-23 | Discharge: 2012-04-23 | Disposition: A | Discharge status: Home / Self Care | Payer: 59 | Attending: Emergency Medicine | Admitting: Emergency Medicine

## 2012-04-23 DIAGNOSIS — M542 Cervicalgia: Secondary | ICD-10-CM | POA: Insufficient documentation

## 2012-04-23 DIAGNOSIS — M549 Dorsalgia, unspecified: Secondary | ICD-10-CM

## 2012-04-23 DIAGNOSIS — F411 Generalized anxiety disorder: Secondary | ICD-10-CM | POA: Insufficient documentation

## 2012-04-23 DIAGNOSIS — R209 Unspecified disturbances of skin sensation: Secondary | ICD-10-CM | POA: Insufficient documentation

## 2012-04-23 DIAGNOSIS — M545 Low back pain, unspecified: Secondary | ICD-10-CM | POA: Insufficient documentation

## 2012-04-23 MED ORDER — HYDROCODONE-ACETAMINOPHEN 5-325 MG PO TABS: 1.0000 | ORAL_TABLET | Freq: Four times a day (QID) | ORAL | Status: AC | PRN

## 2012-04-23 MED ORDER — CARISOPRODOL 350 MG PO TABS: 350.0000 mg | ORAL_TABLET | Freq: Three times a day (TID) | ORAL | Status: AC

## 2012-04-23 NOTE — Discharge Instructions (Signed)
Chronic Back Pain When back pain lasts longer than 3 months, it is called chronic back pain.This pain can be frustrating, but the cause of the pain is rarely dangerous.People with chronic back pain often go through certain periods that are more intense (flare-ups). CAUSES Chronic back pain can be caused by wear and tear (degeneration) on different structures in your back. These structures may include bones, ligaments, or discs. This degeneration may result in more pressure being placed on the nerves that travel to your legs and feet. This can lead to pain traveling from the low back down the back of the legs. When pain lasts longer than 3 months, it is not unusual for people to experience anxiety or depression. Anxiety and depression can also contribute to low back pain. TREATMENT  Establish a regular exercise plan. This is critical to improving your functional level.   Have a self-management plan for when you flare-up. Flare-ups rarely require a medical visit. Regular exercise will help reduce the intensity and frequency of your flare-ups.   Manage how you feel about your back pain and the rest of your life. Anxiety, depression, and feeling that you cannot alter your back pain have been shown to make back pain more intense and debilitating.   Medicines should never be your only treatment. They should be used along with other treatments to help you return to a more active lifestyle.   Procedures such as injections or surgery may be helpful but are rarely necessary. You may be able to get the same results with physical therapy or chiropractic care.  HOME CARE INSTRUCTIONS  Avoid bending, heavy lifting, prolonged sitting, and activities which make the problem worse.   Continue normal activity as much as possible.   Take brief periods of rest throughout the day to reduce your pain during flare-ups.   Follow your back exercise rehabilitation program. This can help reduce symptoms and prevent  more pain.   Only take over-the-counter or prescription medicines as directed by your caregiver. Muscle relaxants are sometimes prescribed. Narcotic pain medicine is discouraged for long-term pain, since addiction is a possible outcome.   If you smoke, quit.   Eat healthy foods and maintain a recommended body weight.  SEEK IMMEDIATE MEDICAL CARE IF:   You have weakness or numbness in one of your legs or feet.   You have trouble controlling your bladder or bowels.   You develop nausea, vomiting, abdominal pain, shortness of breath, or fainting.  Document Released: 01/20/2005 Document Revised: 12/02/2011 Document Reviewed: 11/27/2011 [employer] Patient Information 2012 [employer]. 

## 2012-04-23 NOTE — ED Notes (Signed)
Pt in from home with c/o lower back pain pt states chronic lifting pt states pain is in bilateral arms pain radiates from upper to lower back

## 2012-04-23 NOTE — ED Provider Notes (Signed)
History     CSN: [id number]  Arrival date & time 04/23/12  1155   First MD Initiated Contact with Patient 04/23/12 1201     12:34 PM HPI Patient reports chronic right-sided neck pain and bilateral lower back pain. Reports symptoms have worsened in the last 2 weeks since she has been lifting up her father at home. She wakes up in the morning with numbness in the right arm. Reports numbness will resolve after a few minutes. Denies change in the back pain. Denies saddle anesthesias, perineal numbness, incontinence, abdominal pain, nausea, vomiting, urinary symptoms, trauma, injury. Reports usually takes Vicodin and soma for pain. Reports she is out of her medication to she is able to followup with the new pain management clinic next month.  Patient is a 46 y.o. female presenting with back pain. The history is provided by the patient.  Back Pain  This is a chronic problem. The problem occurs constantly. The problem has been gradually worsening. The pain is associated with lifting heavy objects. The pain is present in the lumbar spine. The pain is severe. The symptoms are aggravated by bending, twisting and certain positions. Stiffness is present in the morning. Associated symptoms include numbness. Pertinent negatives include no chest pain, no fever, no headaches, no abdominal pain, no bowel incontinence, no perianal numbness, no bladder incontinence, no dysuria, no pelvic pain, no paresis, no tingling and no weakness. Treatments tried: Vicodin and soma. The treatment provided moderate relief.    Past Medical History  Diagnosis Date  . Tobacco use disorder   . Generalized anxiety disorder   . Panic disorder without agoraphobia   . Abnormal maternal glucose tolerance, complicating pregnancy, childbirth, or the puerperium, unspecified as to episode of care     Past Surgical History  Procedure Date  . Rhinoplasty   . Bilateral salpingoophorectomy 12/11    by DrBernardo    Family History    Problem Relation Age of Onset  . Coronary artery disease Father   . Asthma Mother     History  Substance Use Topics  . Smoking status: Former Smoker    Quit date: 08/27/2009  . Smokeless tobacco: Never Used  . Alcohol Use: No    OB History    Grav Para Term Preterm Abortions TAB SAB Ect Mult Living                  Review of Systems  Constitutional: Negative for fever and fatigue.  HENT: Negative for ear pain, facial swelling and neck pain.   Respiratory: Positive for chest tightness. Negative for shortness of breath.   Cardiovascular: Negative for chest pain.  Gastrointestinal: Negative for nausea, vomiting, abdominal pain and bowel incontinence.  Genitourinary: Negative for bladder incontinence, dysuria, hematuria and pelvic pain.  Musculoskeletal: Positive for back pain.  Skin: Negative for wound.  Neurological: Positive for numbness. Negative for dizziness, tingling, weakness, light-headedness and headaches.  All other systems reviewed and are negative.    Allergies  Review of patient's allergies indicates no known allergies.  Home Medications   Current Outpatient Rx  Name Route Sig Dispense Refill  . ALPRAZOLAM 1 MG PO TABS Oral Take 1 mg by mouth 2 (two) times daily as needed. For anxiety    . CARISOPRODOL 350 MG PO TABS Oral Take 1 tablet (350 mg total) by mouth 2 (two) times daily as needed. Not to exceed 2 per day 60 tablet 5  . DIVALPROEX SODIUM ER 500 MG PO TB24 Oral Take 500  mg by mouth every evening.     Marland Kitchen HYDROCODONE-ACETAMINOPHEN 5-500 MG PO TABS Oral Take 1 tablet by mouth every 4 (four) hours as needed. 1 tablet 3 times a day as needed for pain. Not to exceed 3  Per day    . HYPROMELLOSE 2.5 % OP SOLN Both Eyes Place 1 drop into both eyes 3 (three) times daily as needed. For dry eyes      BP 135/86  Pulse 105  Temp(Src) 99 F (37.2 C) (Oral)  Resp 16  SpO2 100%  Physical Exam  Vitals reviewed. Constitutional: She is oriented to person, place,  and time. Vital signs are normal. She appears well-developed and well-nourished.  HENT:  Head: Normocephalic and atraumatic.  Eyes: Conjunctivae are normal. Pupils are equal, round, and reactive to light.  Neck: Normal range of motion. Neck supple.  Cardiovascular: Normal rate, regular rhythm and normal heart sounds.  Exam reveals no friction rub.   No murmur heard. Pulmonary/Chest: Effort normal and breath sounds normal. She has no wheezes. She has no rhonchi. She has no rales. She exhibits no tenderness.  Musculoskeletal: Normal range of motion.       Cervical back: She exhibits tenderness, pain and spasm. She exhibits normal range of motion, no bony tenderness, no swelling, no edema, no laceration and normal pulse.       Back:       Negative straight-leg raise, normal bilateral distal pulses  Neurological: She is alert and oriented to person, place, and time. Coordination normal.  Skin: Skin is warm and dry. No rash noted. No erythema. No pallor.    ED Course  Procedures   MDM          Thomasene Lot, PA-C 04/23/12 1239

## 2012-04-25 NOTE — ED Provider Notes (Signed)
Medical screening examination/treatment/procedure(s) were performed by non-physician practitioner and as supervising physician I was immediately available for consultation/collaboration.  Ailyn Gladd, MD 04/25/12 1255 

## 2012-05-17 ENCOUNTER — Emergency Department (HOSPITAL_COMMUNITY): Payer: Self-pay

## 2012-05-17 ENCOUNTER — Emergency Department (HOSPITAL_COMMUNITY)
Admission: EM | Admit: 2012-05-17 | Discharge: 2012-05-17 | Disposition: A | Discharge status: Home / Self Care | Payer: Self-pay | Attending: Emergency Medicine | Admitting: Emergency Medicine

## 2012-05-17 DIAGNOSIS — R112 Nausea with vomiting, unspecified: Secondary | ICD-10-CM | POA: Insufficient documentation

## 2012-05-17 DIAGNOSIS — R5381 Other malaise: Secondary | ICD-10-CM | POA: Insufficient documentation

## 2012-05-17 DIAGNOSIS — R059 Cough, unspecified: Secondary | ICD-10-CM | POA: Insufficient documentation

## 2012-05-17 DIAGNOSIS — H6090 Unspecified otitis externa, unspecified ear: Secondary | ICD-10-CM

## 2012-05-17 DIAGNOSIS — H60399 Other infective otitis externa, unspecified ear: Secondary | ICD-10-CM | POA: Insufficient documentation

## 2012-05-17 DIAGNOSIS — J40 Bronchitis, not specified as acute or chronic: Secondary | ICD-10-CM | POA: Insufficient documentation

## 2012-05-17 DIAGNOSIS — R05 Cough: Secondary | ICD-10-CM | POA: Insufficient documentation

## 2012-05-17 MED ORDER — NEOMYCIN-POLYMYXIN-HC 1 % OT SOLN: 3.0000 [drp] | Freq: Four times a day (QID) | OTIC | Status: DC

## 2012-05-17 MED ORDER — ALBUTEROL SULFATE HFA 108 (90 BASE) MCG/ACT IN AERS: 2.0000 | INHALATION_SPRAY | RESPIRATORY_TRACT | Status: DC | PRN

## 2012-05-17 MED ORDER — PREDNISONE (PAK) 10 MG PO TABS: 10.0000 mg | ORAL_TABLET | Freq: Every day | ORAL | Status: AC

## 2012-05-17 MED ORDER — ANTIPYRINE-BENZOCAINE 5.4-1.4 % OT SOLN: 3.0000 [drp] | OTIC | Status: AC | PRN

## 2012-05-17 MED ORDER — ALBUTEROL SULFATE (5 MG/ML) 0.5% IN NEBU
5.0000 mg | INHALATION_SOLUTION | Freq: Once | RESPIRATORY_TRACT | Status: AC
  Administered 2012-05-17: 5 mg via RESPIRATORY_TRACT
  Filled 2012-05-17: qty 1
  Filled 2012-05-17: qty 0.5

## 2012-05-17 MED ORDER — ALPRAZOLAM 0.5 MG PO TABS
0.5000 mg | ORAL_TABLET | Freq: Once | ORAL | Status: AC
  Administered 2012-05-17: 0.5 mg via ORAL
  Filled 2012-05-17: qty 1

## 2012-05-17 MED ORDER — PREDNISONE (PAK) 10 MG PO TABS: 10.0000 mg | ORAL_TABLET | Freq: Every day | ORAL | Status: DC

## 2012-05-17 MED ORDER — ANTIPYRINE-BENZOCAINE 5.4-1.4 % OT SOLN: 3.0000 [drp] | OTIC | Status: DC | PRN

## 2012-05-17 MED ORDER — KETOROLAC TROMETHAMINE 30 MG/ML IJ SOLN
60.0000 mg | Freq: Once | INTRAMUSCULAR | Status: AC
  Administered 2012-05-17: 60 mg via INTRAMUSCULAR
  Filled 2012-05-17: qty 1

## 2012-05-17 NOTE — ED Notes (Signed)
States that she has had left ear pain for the past 7 days. States that she has had some balance problems that she feels is related to the ear pain. States that she took a family members Tussinex which made the cough more productive which she states that she has had for the past month. States that she was taking Augmentin for a sinus infection which she completed the regimen.

## 2012-05-17 NOTE — Discharge Instructions (Signed)
Read the information below.  Follow up with your primary care provider.  You may return to the ER at any time for worsening condition or any new symptoms that concern you.   Bronchitis Bronchitis is the body's way of reacting to injury and/or infection (inflammation) of the bronchi. Bronchi are the air tubes that extend from the windpipe into the lungs. If the inflammation becomes severe, it may cause shortness of breath. CAUSES  Inflammation may be caused by:  A virus.   Germs (bacteria).   Dust.   Allergens.   Pollutants and many other irritants.  The cells lining the bronchial tree are covered with tiny hairs (cilia). These constantly beat upward, away from the lungs, toward the mouth. This keeps the lungs free of pollutants. When these cells become too irritated and are unable to do their job, mucus begins to develop. This causes the characteristic cough of bronchitis. The cough clears the lungs when the cilia are unable to do their job. Without either of these protective mechanisms, the mucus would settle in the lungs. Then you would develop pneumonia. Smoking is a common cause of bronchitis and can contribute to pneumonia. Stopping this habit is the single most important thing you can do to help yourself. TREATMENT   Your caregiver may prescribe an antibiotic if the cough is caused by bacteria. Also, medicines that open up your airways make it easier to breathe. Your caregiver may also recommend or prescribe an expectorant. It will loosen the mucus to be coughed up. Only take over-the-counter or prescription medicines for pain, discomfort, or fever as directed by your caregiver.   Removing whatever causes the problem (smoking, for example) is critical to preventing the problem from getting worse.   Cough suppressants may be prescribed for relief of cough symptoms.   Inhaled medicines may be prescribed to help with symptoms now and to help prevent problems from returning.   For  those with recurrent (chronic) bronchitis, there may be a need for steroid medicines.  SEEK IMMEDIATE MEDICAL CARE IF:   During treatment, you develop more pus-like mucus (purulent sputum).   You have a fever.   Your baby is older than 3 months with a rectal temperature of 102 F (38.9 C) or higher.   Your baby is 8 months old or younger with a rectal temperature of 100.4 F (38 C) or higher.   You become progressively more ill.   You have increased difficulty breathing, wheezing, or shortness of breath.  It is necessary to seek immediate medical care if you are elderly or sick from any other disease. MAKE SURE YOU:   Understand these instructions.   Will watch your condition.   Will get help right away if you are not doing well or get worse.  Document Released: 12/13/2005 Document Revised: 12/02/2011 Document Reviewed: 10/22/2008 [facility] Patient Information 2012 [place], [place].  Otitis Externa Swimmer's ear (otitis externa) is an infection in the outer ear canal. It can be caused by a germ or a fungus. It may be caused by:  Swimming in dirty water.   Water that stays in the ear after swimming.  HOME CARE  Put drops in the ear canal as told by your doctor.   Only take medicine as told by your doctor.  GET HELP RIGHT AWAY IF:   You have a temperature by mouth above 102 F (38.9 C), not controlled by medicine.   There is ear pain after 3 days.  MAKE SURE YOU:  Understand these instructions.   Will watch this condition.   Will get help right away if you are not doing well or get worse.  Document Released: 05/31/2008 Document Revised: 12/02/2011 Document Reviewed: 05/31/2008 [facility] Patient Information 2012 [place], [place].

## 2012-05-17 NOTE — ED Notes (Signed)
Patient transported to X-ray 

## 2012-05-17 NOTE — ED Notes (Signed)
Voiced understanding of instructions given 

## 2012-05-17 NOTE — ED Provider Notes (Signed)
History     CSN: [id number]  Arrival date & time 05/17/12  8295   First MD Initiated Contact with Patient 05/17/12 0945      Chief Complaint  Patient presents with  . URI  . Migraine    (Consider location/radiation/quality/duration/timing/severity/associated sxs/prior treatment) HPI Comments: Patient reports she has been sick for four weeks.  States that she has had left ear pain and cough for one month.  States she was put on a course of Augmentin, which he took for ten days with no improvement.  States she has had four migraines in four weeks.  That are her typical migraines, and she has one right now-pain is on the right side of her head with associated nausea and vomiting.  States she is tired of being sick.  Cough has become productive of sputum over the past few days as she started taking Tussin at home.  Also, complains of left ear pain.  States it feels that it is stopped up, feels as if there is water in there, and is ringing.  States she has had two episodes of problems with her balance in the past month, which consists only of her falling off to the side while walking.  This only lasts momentarily  Patient is a 46 y.o. female presenting with URI and migraine. The history is provided by the patient.  URI The primary symptoms include fatigue, ear pain, cough, nausea and vomiting. Primary symptoms do not include fever, headaches, sore throat, swollen glands, wheezing or abdominal pain.  The illness is not associated with sinus pressure or congestion.  Migraine Associated symptoms include coughing, fatigue, nausea and vomiting. Pertinent negatives include no abdominal pain, congestion, fever, headaches, numbness, sore throat, swollen glands or weakness.    Past Medical History  Diagnosis Date  . Current smoker   . Generalized anxiety disorder   . Panic disorder without agoraphobia   . Abnormal maternal glucose tolerance, complicating pregnancy, childbirth, or the puerperium,  unspecified as to episode of care     Past Surgical History  Procedure Date  . Rhinoplasty   . Bilateral salpingoophorectomy 12/11    by DrBernardo    Family History  Problem Relation Age of Onset  . Coronary artery disease Father   . Asthma Mother     History  Substance Use Topics  . Smoking status: Former Smoker    Quit date: 08/27/2009  . Smokeless tobacco: Never Used  . Alcohol Use: No    OB History    Grav Para Term Preterm Abortions TAB SAB Ect Mult Living                  Review of Systems  Constitutional: Positive for fatigue. Negative for fever.  HENT: Positive for ear pain. Negative for congestion, sore throat and sinus pressure.   Respiratory: Positive for cough and shortness of breath. Negative for wheezing.   Gastrointestinal: Positive for nausea and vomiting. Negative for abdominal pain.  Neurological: Negative for syncope, weakness, numbness and headaches.    Allergies  Imitrex and Relpax  Home Medications   Current Outpatient Rx  Name Route Sig Dispense Refill  . ALPRAZOLAM 1 MG PO TABS Oral Take 1 mg by mouth 2 (two) times daily as needed. For anxiety    . CARISOPRODOL 350 MG PO TABS Oral Take 1 tablet (350 mg total) by mouth 2 (two) times daily as needed. Not to exceed 2 per day 60 tablet 5  . DIVALPROEX SODIUM ER 500 MG  PO TB24 Oral Take 1,500 mg by mouth every evening. Pt takes 3 tabs of 500mg  to equal total dose of 1,500mg     . HYDROCODONE-ACETAMINOPHEN 5-500 MG PO TABS Oral Take 1 tablet by mouth every 4 (four) hours as needed. 1 tablet 3 times a day as needed for pain. Not to exceed 3  Per day    . HYPROMELLOSE 2.5 % OP SOLN Both Eyes Place 1 drop into both eyes 3 (three) times daily as needed. For dry eyes      BP 131/82  Pulse 85  Temp(Src) 98.7 F (37.1 C) (Oral)  Resp 15  SpO2 100%  Physical Exam  Nursing note and vitals reviewed. Constitutional: She is oriented to person, place, and time. She appears well-developed and  well-nourished.  HENT:  Head: Normocephalic and atraumatic.  Right Ear: Tympanic membrane normal.  Left Ear: Tympanic membrane normal.  Mouth/Throat: Uvula is midline and oropharynx is clear and moist. No uvula swelling. No oropharyngeal exudate, posterior oropharyngeal edema or tonsillar abscesses.       Left ear painful with pressure on tragus and with traction, painful during exam.  Canal is unremarkable.    Neck: Trachea normal, normal range of motion and phonation normal. Neck supple. No tracheal tenderness present. No rigidity. No tracheal deviation and normal range of motion present.  Cardiovascular: Normal rate and regular rhythm.   Pulmonary/Chest: Effort normal and breath sounds normal. No stridor. No respiratory distress. She has no wheezes. She has no rales.  Abdominal: She exhibits no distension. There is no tenderness. There is no rebound and no guarding.  Neurological: She is alert and oriented to person, place, and time. She has normal strength. No cranial nerve deficit or sensory deficit. She exhibits normal muscle tone. She displays a negative Romberg sign. Gait normal. GCS eye subscore is 4. GCS verbal subscore is 5. GCS motor subscore is 6.       CN II-XII intact, EOMs intact, no nystagmus, no pronator drift, grip strengths equal bilaterally;strength 5/5 in all extremities, sensation is intact, gait is normal.     Psychiatric: She has a normal mood and affect. Her behavior is normal.    ED Course  Procedures (including critical care time)  Labs Reviewed - No data to display Dg Chest 2 View  05/17/2012  *RADIOLOGY REPORT*  Clinical Data: Cough and congestion.  Headache.  CHEST - 2 VIEW  Comparison: 04/20/2011  Findings: The lungs are clear without focal infiltrate, edema, pneumothorax or pleural effusion. The cardiopericardial silhouette is within normal limits for size. Imaged bony structures of the thorax are intact.  IMPRESSION: No acute cardiopulmonary process.  Original  Report Authenticated By: ERIC A. MANSELL, M.D.    11:29 AM Patient reports her headache is much improved.  States the albuterol is giving her a panic attack.  I have ordered xanax as she is out of her home medication and have reassured her about the normal jittery feeling associated with albuterol.  I also explained that she could stop the treatment at any time if she was uncomfortable.  Pt stated that it was helping so she continued.    1. Bronchitis   2. Otitis externa       MDM  Patient with bronchitis, previously tried on antibiotics without improvement.  CXR negative.  Pt felt improvement with neb treatment in ED.  Pt d/c home with auralgan, cortisporin otic, prednisone taper, albuterol.  Return precautions discussed, pt d/c home with PCP follow up.  Patient verbalizes  understanding and agrees with plan.          Dillard Cannon [place], [place] 05/17/12 [phone]

## 2012-05-19 NOTE — ED Provider Notes (Signed)
Medical screening examination/treatment/procedure(s) were performed by non-physician practitioner and as supervising physician I was immediately available for consultation/collaboration.   Livianna Petraglia A Robt Okuda, MD 05/19/12 0003 

## 2012-07-01 ENCOUNTER — Emergency Department (HOSPITAL_COMMUNITY)
Admission: EM | Admit: 2012-07-01 | Discharge: 2012-07-01 | Discharge status: Against Medical Advice | Payer: Medicaid Other | Attending: Emergency Medicine | Admitting: Emergency Medicine

## 2012-07-01 ENCOUNTER — Encounter (HOSPITAL_COMMUNITY): Payer: Self-pay | Admitting: Emergency Medicine

## 2012-07-01 DIAGNOSIS — R42 Dizziness and giddiness: Secondary | ICD-10-CM | POA: Insufficient documentation

## 2012-07-01 DIAGNOSIS — R51 Headache: Secondary | ICD-10-CM | POA: Insufficient documentation

## 2012-07-01 DIAGNOSIS — H538 Other visual disturbances: Secondary | ICD-10-CM | POA: Insufficient documentation

## 2012-07-01 DIAGNOSIS — R112 Nausea with vomiting, unspecified: Secondary | ICD-10-CM | POA: Insufficient documentation

## 2012-07-01 HISTORY — DX: Migraine, unspecified, not intractable, without status migrainosus: G43.909

## 2012-07-01 NOTE — ED Notes (Signed)
Ptreports unable to stay any longer, she has to get back upstairs to visit with her Dad.

## 2012-07-01 NOTE — ED Notes (Addendum)
Pt reports here visiting with her Dad and helping care for him in hospital. Ptreports headache onset at 0400 with N/V, dizziness and blurred vision. Pt has history of migraines and feels like her migraines that she gets frequently.

## 2012-09-23 ENCOUNTER — Encounter (HOSPITAL_COMMUNITY): Payer: Self-pay | Admitting: Emergency Medicine

## 2012-09-23 ENCOUNTER — Emergency Department (HOSPITAL_COMMUNITY)
Admission: EM | Admit: 2012-09-23 | Discharge: 2012-09-23 | Disposition: A | Discharge status: Home / Self Care | Payer: Medicaid Other | Attending: Emergency Medicine | Admitting: Emergency Medicine

## 2012-09-23 DIAGNOSIS — Z888 Allergy status to other drugs, medicaments and biological substances status: Secondary | ICD-10-CM | POA: Insufficient documentation

## 2012-09-23 DIAGNOSIS — Z8249 Family history of ischemic heart disease and other diseases of the circulatory system: Secondary | ICD-10-CM | POA: Insufficient documentation

## 2012-09-23 DIAGNOSIS — F411 Generalized anxiety disorder: Secondary | ICD-10-CM | POA: Insufficient documentation

## 2012-09-23 DIAGNOSIS — Z87891 Personal history of nicotine dependence: Secondary | ICD-10-CM | POA: Insufficient documentation

## 2012-09-23 DIAGNOSIS — M549 Dorsalgia, unspecified: Secondary | ICD-10-CM | POA: Insufficient documentation

## 2012-09-23 DIAGNOSIS — Z7982 Long term (current) use of aspirin: Secondary | ICD-10-CM | POA: Insufficient documentation

## 2012-09-23 DIAGNOSIS — Z825 Family history of asthma and other chronic lower respiratory diseases: Secondary | ICD-10-CM | POA: Insufficient documentation

## 2012-09-23 MED ORDER — CARISOPRODOL 350 MG PO TABS: 350.0000 mg | ORAL_TABLET | Freq: Three times a day (TID) | ORAL | Status: DC

## 2012-09-23 MED ORDER — OXYCODONE-ACETAMINOPHEN 5-325 MG PO TABS: 1.0000 | ORAL_TABLET | ORAL | Status: DC | PRN

## 2012-09-23 MED ORDER — KETOROLAC TROMETHAMINE 60 MG/2ML IM SOLN
60.0000 mg | Freq: Once | INTRAMUSCULAR | Status: AC
  Administered 2012-09-23: 60 mg via INTRAMUSCULAR
  Filled 2012-09-23: qty 2

## 2012-09-23 NOTE — ED Provider Notes (Signed)
History     CSN: [id number]  Arrival date & time 09/23/12  1104   First MD Initiated Contact with Patient 09/23/12 1257      Chief Complaint  Patient presents with  . Leg Pain    (Consider location/radiation/quality/duration/timing/severity/associated sxs/prior treatment) Patient is a 46 y.o. female presenting with leg pain. The history is provided by the patient. No language interpreter was used.  Leg Pain  The incident occurred more than 2 days ago. The incident occurred at home. Injury mechanism: lifting. The pain is present in the right hip and right leg. The quality of the pain is described as throbbing and burning. The pain is at a severity of 9/10. The pain is moderate. The pain has been constant since onset. Pertinent negatives include no numbness, no inability to bear weight, no loss of motion, no muscle weakness, no loss of sensation and no tingling. The symptoms are aggravated by activity. She has tried NSAIDs for the symptoms. The treatment provided mild relief.  46yo female with c/o R hip/leg pain that radiates down the back of her leg x 4 days.  Patient has been taking care of her >250lb father who needs help getting up and down.  Similar pain in April when she was here.    Past Medical History  Diagnosis Date  . Tobacco use disorder   . Generalized anxiety disorder   . Panic disorder without agoraphobia   . Abnormal maternal glucose tolerance, complicating pregnancy, childbirth, or the puerperium, unspecified as to episode of care   . Migraine     Past Surgical History  Procedure Date  . Rhinoplasty   . Bilateral salpingoophorectomy 12/11    by DrBernardo  . Abdominal hysterectomy   . Cholecystectomy   . Tonsillectomy   . Adenoidectomy   . Cesarean section     Family History  Problem Relation Age of Onset  . Coronary artery disease Father   . Asthma Mother     History  Substance Use Topics  . Smoking status: Former Smoker    Quit date: 08/27/2009  .  Smokeless tobacco: Never Used  . Alcohol Use: No    OB History    Grav Para Term Preterm Abortions TAB SAB Ect Mult Living                  Review of Systems  Constitutional: Negative.   HENT: Negative.   Eyes: Negative.   Respiratory: Negative.   Cardiovascular: Negative.   Gastrointestinal: Negative.   Musculoskeletal: Positive for back pain, arthralgias and gait problem. Negative for joint swelling.  Neurological: Negative.  Negative for tingling and numbness.  Psychiatric/Behavioral: Negative.   All other systems reviewed and are negative.    Allergies  Imitrex and Relpax  Home Medications   Current Outpatient Rx  Name Route Sig Dispense Refill  . ALPRAZOLAM 1 MG PO TABS Oral Take 1 mg by mouth 2 (two) times daily as needed. For anxiety    . ASPIRIN EC 81 MG PO TBEC Oral Take 81 mg by mouth daily.    Marland Kitchen DIVALPROEX SODIUM ER 500 MG PO TB24 Oral Take 1,500 mg by mouth every evening. Pt takes 3 tabs of 500mg  to equal total dose of 1,500mg     . HYDROCODONE-ACETAMINOPHEN 5-500 MG PO TABS Oral Take 1 tablet by mouth every 4 (four) hours as needed. 1 tablet 3 times a day as needed for pain. Not to exceed 3  Per day    . ALBUTEROL SULFATE  HFA 108 (90 BASE) MCG/ACT IN AERS Inhalation Inhale 2 puffs into the lungs every 4 (four) hours as needed for wheezing or shortness of breath. 1 Inhaler 0    BP 132/99  Pulse 92  Temp 98.1 F (36.7 C) (Oral)  Resp 20  SpO2 100%  Physical Exam  Nursing note and vitals reviewed. Constitutional: She is oriented to person, place, and time. She appears well-developed and well-nourished.  HENT:  Head: Normocephalic and atraumatic.  Eyes: Conjunctivae normal and EOM are normal. Pupils are equal, round, and reactive to light.  Neck: Normal range of motion. Neck supple.  Cardiovascular: Normal rate.   Pulmonary/Chest: Effort normal.  Abdominal: Soft.  Musculoskeletal: Normal range of motion. She exhibits tenderness. She exhibits no edema.        R hip and RLE pain with sciatica  Neurological: She is alert and oriented to person, place, and time. She has normal reflexes. No cranial nerve deficit. Coordination normal.  Skin: Skin is warm and dry.  Psychiatric: She has a normal mood and affect.    ED Course  Procedures (including critical care time) 2:05 PM patient was given Toradol IM back pain. Labs Reviewed - No data to display No results found.   No diagnosis found.    MDM  46 year old female with right lower back pain and radiation into the right posterior thigh. Patient was given Toradol IM in the ER. Patient was prescribed soma and Percocet for pain. Patient was also instructed to start ibuprofen 600 mg every 6 hours x24. She should be using ice as well to the area. She will followup with PCP from the list provided. No red flags. No cauda equina symptoms. Ambulating with some difficulty.        Remi Haggard, NP 09/23/12 1417

## 2012-09-23 NOTE — ED Notes (Signed)
Pt presents with c/o R leg, R lower back and R hip pain x3 days.

## 2012-09-23 NOTE — ED Provider Notes (Signed)
Medical screening examination/treatment/procedure(s) were performed by non-physician practitioner and as supervising physician I was immediately available for consultation/collaboration.   Mylan Schwarz M Mikalia Fessel, MD 09/23/12 2126 

## 2013-02-25 ENCOUNTER — Emergency Department (HOSPITAL_COMMUNITY): Payer: Medicaid Other

## 2013-02-25 ENCOUNTER — Encounter (HOSPITAL_COMMUNITY): Payer: Self-pay | Admitting: Emergency Medicine

## 2013-02-25 ENCOUNTER — Emergency Department (HOSPITAL_COMMUNITY)
Admission: EM | Admit: 2013-02-25 | Discharge: 2013-02-25 | Disposition: A | Discharge status: Home / Self Care | Payer: Medicaid Other | Attending: Emergency Medicine | Admitting: Emergency Medicine

## 2013-02-25 DIAGNOSIS — Y9389 Activity, other specified: Secondary | ICD-10-CM | POA: Insufficient documentation

## 2013-02-25 DIAGNOSIS — F411 Generalized anxiety disorder: Secondary | ICD-10-CM | POA: Insufficient documentation

## 2013-02-25 DIAGNOSIS — Z87891 Personal history of nicotine dependence: Secondary | ICD-10-CM | POA: Insufficient documentation

## 2013-02-25 DIAGNOSIS — Y929 Unspecified place or not applicable: Secondary | ICD-10-CM | POA: Insufficient documentation

## 2013-02-25 DIAGNOSIS — S39012A Strain of muscle, fascia and tendon of lower back, initial encounter: Secondary | ICD-10-CM

## 2013-02-25 DIAGNOSIS — Z862 Personal history of diseases of the blood and blood-forming organs and certain disorders involving the immune mechanism: Secondary | ICD-10-CM | POA: Insufficient documentation

## 2013-02-25 DIAGNOSIS — X500XXA Overexertion from strenuous movement or load, initial encounter: Secondary | ICD-10-CM | POA: Insufficient documentation

## 2013-02-25 DIAGNOSIS — IMO0002 Reserved for concepts with insufficient information to code with codable children: Secondary | ICD-10-CM | POA: Insufficient documentation

## 2013-02-25 DIAGNOSIS — Z8639 Personal history of other endocrine, nutritional and metabolic disease: Secondary | ICD-10-CM | POA: Insufficient documentation

## 2013-02-25 DIAGNOSIS — Z79899 Other long term (current) drug therapy: Secondary | ICD-10-CM | POA: Insufficient documentation

## 2013-02-25 MED ORDER — HYDROCODONE-ACETAMINOPHEN 5-325 MG PO TABS: 2.0000 | ORAL_TABLET | ORAL | Status: DC | PRN

## 2013-02-25 MED ORDER — IBUPROFEN 800 MG PO TABS
800.0000 mg | ORAL_TABLET | Freq: Once | ORAL | Status: AC
  Administered 2013-02-25: 800 mg via ORAL
  Filled 2013-02-25: qty 1

## 2013-02-25 MED ORDER — CYCLOBENZAPRINE HCL 10 MG PO TABS: 10.0000 mg | ORAL_TABLET | Freq: Two times a day (BID) | ORAL | Status: DC | PRN

## 2013-02-25 MED ORDER — IBUPROFEN 600 MG PO TABS: 600.0000 mg | ORAL_TABLET | Freq: Four times a day (QID) | ORAL | Status: DC | PRN

## 2013-02-25 NOTE — ED Provider Notes (Signed)
History     CSN: [id number]  Arrival date & time 02/25/13  4098   First MD Initiated Contact with Patient 02/25/13 1035      Chief Complaint  Patient presents with  . Back Pain    (Consider location/radiation/quality/duration/timing/severity/associated sxs/prior treatment) The history is provided by the patient.  [name] is a 47 y.o. female history of anxiety, hysterectomy here presenting with back pain. 4 days ago she was trying to transfer her father from bed to chair and she felt her back pop. She didn't fall or land on her back. Afterward she noticed that there was a "knot" on the L side of her back that improved now. She also noticed some pain radiating down to the left leg and also some tingling in the left leg. Denies any incontinence or fevers.   Past Medical History  Diagnosis Date  . Tobacco use disorder   . Generalized anxiety disorder   . Panic disorder without agoraphobia   . Abnormal maternal glucose tolerance, complicating pregnancy, childbirth, or the puerperium, unspecified as to episode of care   . Migraine     Past Surgical History  Procedure Laterality Date  . Rhinoplasty    . Bilateral salpingoophorectomy  12/11    by DrBernardo  . Abdominal hysterectomy    . Cholecystectomy    . Tonsillectomy    . Adenoidectomy    . Cesarean section      Family History  Problem Relation Age of Onset  . Coronary artery disease Father   . Asthma Mother     History  Substance Use Topics  . Smoking status: Former Smoker    Quit date: 08/27/2009  . Smokeless tobacco: Never Used  . Alcohol Use: No    OB History   Grav Para Term Preterm Abortions TAB SAB Ect Mult Living                  Review of Systems  Musculoskeletal: Positive for back pain.  All other systems reviewed and are negative.    Allergies  Imitrex and Relpax  Home Medications   Current Outpatient Rx  Name  Route  Sig  Dispense  Refill  . ALPRAZolam (XANAX) 1 MG tablet    Oral   Take 1 mg by mouth 2 (two) times daily as needed. For anxiety         . divalproex (DEPAKOTE) 500 MG 24 hr tablet   Oral   Take 1,500 mg by mouth at bedtime.          Marland Kitchen HYDROcodone-acetaminophen (VICODIN) 5-500 MG per tablet   Oral   Take 1 tablet by mouth every 4 (four) hours as needed for pain.          Marland Kitchen OVER THE COUNTER MEDICATION   Oral   Take 2 tablets by mouth every 6 (six) hours as needed (back pain).           BP 116/77  Pulse 110  Temp(Src) 98.5 F (36.9 C) (Oral)  Resp 16  SpO2 99%  Physical Exam  Nursing note and vitals reviewed. Constitutional: She is oriented to person, place, and time. She appears well-developed and well-nourished.  Uncomfortable   HENT:  Head: Normocephalic.  Mouth/Throat: Oropharynx is clear and moist.  Eyes: Conjunctivae are normal. Pupils are equal, round, and reactive to light.  Neck: Normal range of motion. Neck supple.  Cardiovascular: Regular rhythm and normal heart sounds.   Tachy   Pulmonary/Chest: Effort normal and  breath sounds normal. No respiratory distress. She has no wheezes. She has no rales.  Abdominal: Soft. Bowel sounds are normal. She exhibits no distension. There is no tenderness. There is no rebound.  Musculoskeletal:  L paralumbar tenderness, no midline tenderness   Neurological: She is alert and oriented to person, place, and time.  Neg straight leg raise bilaterally. No saddle anesthesia. Nl reflexes and pulses bilaterally. NL gait. NL strength and sensation bilateral lower extremities.   Skin: Skin is warm and dry.  Psychiatric: She has a normal mood and affect. Her behavior is normal. Judgment and thought content normal.    ED Course  Procedures (including critical care time)  Labs Reviewed - No data to display Dg Lumbar Spine Complete  02/25/2013  *RADIOLOGY REPORT*  Clinical Data: Low back pain, status post fall  LUMBAR SPINE - COMPLETE 4+ VIEW  Comparison: 02/19/2007  Findings: Vestigial  right rib at T12.  Five lumbar-type table bodies.  Normal lumbar lordosis.  No evidence of fracture or dislocation.  Vertebral body heights and intervertebral disc spaces are maintained.  Visualized bony pelvis appears intact.  Cholecystectomy clips.  IMPRESSION: No fracture or dislocation is seen.   Original Report Authenticated By: Charline Bills, M.D.      No diagnosis found.    MDM  [name] is a 47 y.o. female here with back pain. Likely protruded disk. Neurovasular intact bilaterally. Xray showed no fracture. She drove here so will d/c home on motrin and vicodin and flexeril prn.         Richardean Canal, MD 02/25/13 [phone]

## 2013-02-25 NOTE — ED Notes (Signed)
Pt presents w/ new onset low back pain, has a knot in her back she can feel and also is having numbness and pain bilateral LE. 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

## 2013-02-25 NOTE — ED Notes (Signed)
Patient transported to X-ray 

## 2013-03-13 ENCOUNTER — Other Ambulatory Visit: Payer: Self-pay | Admitting: Neurology

## 2013-03-13 DIAGNOSIS — G43909 Migraine, unspecified, not intractable, without status migrainosus: Secondary | ICD-10-CM

## 2013-03-13 DIAGNOSIS — M542 Cervicalgia: Secondary | ICD-10-CM

## 2013-03-19 ENCOUNTER — Telehealth: Payer: Self-pay | Admitting: Neurology

## 2013-03-19 MED ORDER — PROPRANOLOL HCL ER 60 MG PO CP24: 60.0000 mg | ORAL_CAPSULE | Freq: Every day | ORAL | Status: DC

## 2013-03-19 NOTE — Telephone Encounter (Signed)
Pt called and is asking for refill on hydrocodone.  She stated that when in to see Dr. Clyda Hurdle, that she thought she was given 5 day supply, but pharmacy said it was a 15 days supply.  Please advise.

## 2013-03-19 NOTE — Telephone Encounter (Signed)
Please call patient and advise her that I do not recommend ongoing use of hydrocodone which can cause addiction and rebound headaches. Instead, I would suggest that we increase her propranolol whicIwe started at 40 mg strength last time I saw her. If she is able to tolerate it we can increase it to 60 mg once daily long-acting. Please inquire and we can make a change in her prescription for: Inderal LA 60 milligrams strength, take one by mouth q. a.m. thanks  sa

## 2013-03-19 NOTE — Telephone Encounter (Signed)
I spoke to pt and relayed the message that no refill on hydrocodone.  She is tolderating the propranolol 30mg  well. She will try the increase to 60mg  and prescription sent.  Her MRI will be done this Saturday.

## 2013-03-21 ENCOUNTER — Other Ambulatory Visit: Payer: 59

## 2013-03-24 ENCOUNTER — Inpatient Hospital Stay: Admission: RE | Admit: 2013-03-24 | Payer: 59 | Source: Ambulatory Visit

## 2013-03-26 ENCOUNTER — Telehealth: Payer: Self-pay

## 2013-03-26 NOTE — Telephone Encounter (Signed)
[name] sent a fax requesting refills on Hydrocodone.  Last filled #15 on 03/08/13.  Claim patient has been out since 03/19/13.  Says she did not know she was only supposed to take one daily.  They would like to know if you would like to write another rx and auth early refill.  Please Advise.  Thank you.   JCB

## 2013-03-26 NOTE — Telephone Encounter (Signed)
Please see phone note from 03/19/2013. At this moment I am not authorizing a refill on hydrocodone, thanks Huston Foley, MD, PhD [facility] ([facility])

## 2013-04-16 ENCOUNTER — Encounter: Payer: Self-pay | Admitting: *Deleted

## 2013-04-17 ENCOUNTER — Encounter: Payer: Self-pay | Admitting: *Deleted

## 2013-04-17 NOTE — Telephone Encounter (Signed)
This encounter was created in error - please disregard.

## 2013-04-19 ENCOUNTER — Telehealth: Payer: Self-pay

## 2013-04-19 NOTE — Telephone Encounter (Signed)
Message copied by Monico Blitz on Thu Apr 19, 2013  3:26 PM ------      Message from: Richrd Prime      Created: Thu Apr 19, 2013 12:43 PM      Contact: Clarice from [facility]       Called telling us this patient is at their facility and they need an order.  Please call asap   Her call back number is [phone] ------

## 2013-04-19 NOTE — Telephone Encounter (Signed)
I called [facility].  Spoke with Fraser Din.  He said the patient has already gotten all 3 fills.  He said she picked them up on 03/09/13, 03/22/13 and 04/05/13.

## 2013-04-19 NOTE — Telephone Encounter (Signed)
MRI ordered

## 2013-04-19 NOTE — Telephone Encounter (Signed)
I called and left Clarice a message, but I do see that there is an MRI order in place in Epic. I saw that you had called in a prescription for soma 350 mg strength on March 13 with 2 refills. She should still have another refill left at least. Shanda Bumps, please look into this if you can, thanks

## 2013-04-19 NOTE — Telephone Encounter (Signed)
Patient called stating she has contacted [facility] to try and schedule appt for MRI.  Also wants to know if Dr Frances Furbish will authorize rx for Soma 350.  Says she uses [facility].

## 2013-04-20 ENCOUNTER — Telehealth: Payer: Self-pay | Admitting: *Deleted

## 2013-04-20 NOTE — Telephone Encounter (Signed)
Please ask pt to request refill from PCP. I covered the refill when she was in for visit with me. thx  Huston Foley, MD, PhD [facility])

## 2013-05-14 NOTE — Telephone Encounter (Signed)
[id number]

## 2013-05-17 ENCOUNTER — Emergency Department (HOSPITAL_COMMUNITY)
Admission: EM | Admit: 2013-05-17 | Discharge: 2013-05-17 | Disposition: A | Discharge status: Home / Self Care | Payer: Medicaid Other | Attending: Emergency Medicine | Admitting: Emergency Medicine

## 2013-05-17 ENCOUNTER — Encounter (HOSPITAL_COMMUNITY): Payer: Self-pay | Admitting: Emergency Medicine

## 2013-05-17 DIAGNOSIS — R0682 Tachypnea, not elsewhere classified: Secondary | ICD-10-CM | POA: Insufficient documentation

## 2013-05-17 DIAGNOSIS — Z8679 Personal history of other diseases of the circulatory system: Secondary | ICD-10-CM | POA: Insufficient documentation

## 2013-05-17 DIAGNOSIS — Z8632 Personal history of gestational diabetes: Secondary | ICD-10-CM | POA: Insufficient documentation

## 2013-05-17 DIAGNOSIS — Z79899 Other long term (current) drug therapy: Secondary | ICD-10-CM | POA: Insufficient documentation

## 2013-05-17 DIAGNOSIS — R209 Unspecified disturbances of skin sensation: Secondary | ICD-10-CM | POA: Insufficient documentation

## 2013-05-17 DIAGNOSIS — Z87891 Personal history of nicotine dependence: Secondary | ICD-10-CM | POA: Insufficient documentation

## 2013-05-17 DIAGNOSIS — F411 Generalized anxiety disorder: Secondary | ICD-10-CM | POA: Insufficient documentation

## 2013-05-17 DIAGNOSIS — F410 Panic disorder [episodic paroxysmal anxiety] without agoraphobia: Secondary | ICD-10-CM | POA: Insufficient documentation

## 2013-05-17 MED ORDER — LORAZEPAM 2 MG/ML IJ SOLN
1.0000 mg | Freq: Once | INTRAMUSCULAR | Status: AC
  Administered 2013-05-17: 1 mg via INTRAVENOUS
  Filled 2013-05-17: qty 1

## 2013-05-17 MED ORDER — KETOROLAC TROMETHAMINE 30 MG/ML IJ SOLN
30.0000 mg | Freq: Once | INTRAMUSCULAR | Status: AC
  Administered 2013-05-17: 30 mg via INTRAVENOUS
  Filled 2013-05-17: qty 1

## 2013-05-17 MED ORDER — SODIUM CHLORIDE 0.9 % IV BOLUS (SEPSIS)
1000.0000 mL | Freq: Once | INTRAVENOUS | Status: AC
  Administered 2013-05-17: 1000 mL via INTRAVENOUS

## 2013-05-17 MED ORDER — ONDANSETRON HCL 4 MG/2ML IJ SOLN
4.0000 mg | Freq: Once | INTRAMUSCULAR | Status: AC
  Administered 2013-05-17: 4 mg via INTRAVENOUS
  Filled 2013-05-17: qty 2

## 2013-05-17 NOTE — ED Notes (Signed)
[id number]<AV> Expected date:<BR> Expected time:<BR> Means of arrival:<BR> Comments:<BR> ems- panic attack

## 2013-05-17 NOTE — ED Provider Notes (Signed)
History     CSN: [id number]  Arrival date & time 05/17/13  1001   First MD Initiated Contact with Patient 05/17/13 1009      Chief Complaint  Patient presents with  . Panic Attack    (Consider location/radiation/quality/duration/timing/severity/associated sxs/prior treatment) HPI Patient presents to the emergency department with anxiety attacks.  Patient, states, that a lot of family issues that have caused increased anxiety for her.  Patient, states, that she took her Xanax, but vomited it up this morning.  Patient also has a significant history of migraine headaches, which she states her anxiety levels have increased in frequency of her migraines.  Patient, states she did not have any chest pain, shortness of breath, nausea, vomiting, diarrhea, abdominal pain, weakness, numbness, dizziness, or syncope.  Patient, states, that she feels tingling in all of her extremities and has increased breathing . Past Medical History  Diagnosis Date  . Tobacco use disorder   . Generalized anxiety disorder   . Panic disorder without agoraphobia   . Abnormal maternal glucose tolerance, complicating pregnancy, childbirth, or the puerperium, unspecified as to episode of care   . Migraine     Past Surgical History  Procedure Laterality Date  . Rhinoplasty    . Bilateral salpingoophorectomy  12/11    by DrBernardo  . Abdominal hysterectomy    . Cholecystectomy    . Tonsillectomy    . Adenoidectomy    . Cesarean section      Family History  Problem Relation Age of Onset  . Coronary artery disease Father   . Asthma Mother     History  Substance Use Topics  . Smoking status: Former Smoker    Quit date: 08/27/2009  . Smokeless tobacco: Never Used  . Alcohol Use: No    OB History   Grav Para Term Preterm Abortions TAB SAB Ect Mult Living                  Review of Systems All other systems negative except as documented in the HPI. All pertinent positives and negatives as reviewed  in the HPI. Allergies  Topamax; Imitrex; and Relpax  Home Medications   Current Outpatient Rx  Name  Route  Sig  Dispense  Refill  . ALPRAZolam (XANAX) 1 MG tablet   Oral   Take 1 mg by mouth 2 (two) times daily as needed. For anxiety         . Butalbital-Acetaminophen (BUPAP) 50-300 MG TABS   Oral   Take 1 tablet by mouth every 6 (six) hours as needed.         . carisoprodol (SOMA) 350 MG tablet   Oral   Take 350 mg by mouth 2 (two) times daily as needed (neck pain).         Marland Kitchen diphenhydrAMINE (BENADRYL) 50 MG capsule   Oral   Take 50 mg by mouth every 6 (six) hours as needed for itching or sleep.         Marland Kitchen ibuprofen (ADVIL,MOTRIN) 600 MG tablet   Oral   Take 1 tablet (600 mg total) by mouth every 6 (six) hours as needed for pain.   30 tablet   0   . propranolol ER (INDERAL LA) 60 MG 24 hr capsule   Oral   Take 1 capsule (60 mg total) by mouth daily.   30 capsule   3     In am.     BP 122/80  Pulse 76  Temp(Src)  98.6 F (37 C) (Oral)  Resp 24  SpO2 100%  Physical Exam  Nursing note and vitals reviewed. Constitutional: She is oriented to person, place, and time. She appears well-developed.  HENT:  Head: Normocephalic and atraumatic.  Mouth/Throat: Oropharynx is clear and moist.  Eyes: Pupils are equal, round, and reactive to light.  Neck: Normal range of motion. Neck supple.  Cardiovascular: Normal rate, regular rhythm and normal heart sounds.  Exam reveals no gallop and no friction rub.   No murmur heard. Pulmonary/Chest: Breath sounds normal. No accessory muscle usage. Tachypnea noted. No apnea. No respiratory distress. She has no decreased breath sounds. She has no wheezes. She has no rhonchi. She has no rales.  Neurological: She is alert and oriented to person, place, and time.  Skin: Skin is warm and dry. No rash noted.  Psychiatric: Her mood appears anxious. Her speech is rapid and/or pressured. She is hyperactive. She is not aggressive, not  actively hallucinating and not combative. Thought content is not paranoid. She expresses no homicidal and no suicidal ideation. She expresses no suicidal plans and no homicidal plans.    ED Course  Procedures (including critical care time)   patient was previously taking Effexor for anxiety and depression patient, states, that she stopped taking those due to the fact that she did not want to be drugged all the time patient be referred back to her primary care Dr. for further evaluation.  Patient does not have any suicidal or homicidal ideations at this time.  Patient has no significant findings on her physical exam, patient is very anxious and breathing fast.  Vital signs remained normal here in the emergency department   MDM          Carlyle Dolly, PA-C 05/17/13 1341  Medical screening examination/treatment/procedure(s) were performed by non-physician practitioner and as supervising physician I was immediately available for consultation/collaboration.  Derwood Kaplan, MD 05/17/13 [phone]

## 2013-05-17 NOTE — ED Notes (Signed)
Per EMS-pt c/o of panic attack that started this am. Medication management Xanax and Valium, did have Xanax this am.

## 2013-08-24 ENCOUNTER — Observation Stay (HOSPITAL_COMMUNITY)
Admission: EM | Admit: 2013-08-24 | Discharge: 2013-08-25 | Disposition: A | Discharge status: Home / Self Care | Payer: Medicaid Other | Attending: Emergency Medicine | Admitting: Emergency Medicine

## 2013-08-24 ENCOUNTER — Encounter (HOSPITAL_COMMUNITY): Payer: Self-pay | Admitting: *Deleted

## 2013-08-24 DIAGNOSIS — R404 Transient alteration of awareness: Secondary | ICD-10-CM | POA: Insufficient documentation

## 2013-08-24 DIAGNOSIS — T426X1A Poisoning by other antiepileptic and sedative-hypnotic drugs, accidental (unintentional), initial encounter: Principal | ICD-10-CM | POA: Insufficient documentation

## 2013-08-24 DIAGNOSIS — R4182 Altered mental status, unspecified: Secondary | ICD-10-CM

## 2013-08-24 DIAGNOSIS — G43909 Migraine, unspecified, not intractable, without status migrainosus: Secondary | ICD-10-CM | POA: Insufficient documentation

## 2013-08-24 DIAGNOSIS — F411 Generalized anxiety disorder: Secondary | ICD-10-CM | POA: Insufficient documentation

## 2013-08-24 LAB — POCT PREGNANCY, URINE: Preg Test, Ur: NEGATIVE

## 2013-08-24 MED ORDER — NALOXONE HCL 1 MG/ML IJ SOLN
1.0000 mg | Freq: Once | INTRAMUSCULAR | Status: AC
  Administered 2013-08-25: 1 mg via INTRAVENOUS
  Filled 2013-08-24: qty 2

## 2013-08-24 MED ORDER — SODIUM CHLORIDE 0.9 % IV BOLUS (SEPSIS)
1000.0000 mL | Freq: Once | INTRAVENOUS | Status: AC
  Administered 2013-08-24: 1000 mL via INTRAVENOUS

## 2013-08-24 NOTE — ED Notes (Signed)
Per ems was called out d/t pt was found in her car in the driveway passed out. Per pt's family pt has had depression. Pt was able to tell ems that she took 4-5 tablets of buspar. Pt's husband stated he wanted her to come d/t he thinks pt has been abusing her rx medications.

## 2013-08-24 NOTE — ED Provider Notes (Signed)
CSN: [id number]     Arrival date & time 08/24/13  2305 History   First MD Initiated Contact with Patient 08/24/13 2313     Chief Complaint  Patient presents with  . Depression   (Consider location/radiation/quality/duration/timing/severity/associated sxs/prior Treatment) HPI  [name] is a 47 y.o. female brought in by EMS for altered mental status. Patient was found in her car in the driveway unresponsive. Patient may have taken 5 BuSpar. Questionable accidental overdose as per the husband. Patient arrives with no family. Patient is responsive to voice and pain. Denies suicide attempt, oriented to person place and time. Level V caveat secondary to altered mental status.  Past Medical History  Diagnosis Date  . Tobacco use disorder   . Generalized anxiety disorder   . Panic disorder without agoraphobia   . Abnormal maternal glucose tolerance, complicating pregnancy, childbirth, or the puerperium, unspecified as to episode of care   . Migraine    Past Surgical History  Procedure Laterality Date  . Rhinoplasty    . Bilateral salpingoophorectomy  12/11    by DrBernardo  . Abdominal hysterectomy    . Cholecystectomy    . Tonsillectomy    . Adenoidectomy    . Cesarean section     Family History  Problem Relation Age of Onset  . Coronary artery disease Father   . Asthma Mother    History  Substance Use Topics  . Smoking status: Former Smoker    Quit date: 08/27/2009  . Smokeless tobacco: Never Used  . Alcohol Use: No   OB History   Grav Para Term Preterm Abortions TAB SAB Ect Mult Living                 Review of Systems  Unable to perform ROS: Mental status change    Allergies  Imitrex; Relpax; and Topamax  Home Medications   Current Outpatient Rx  Name  Route  Sig  Dispense  Refill  . ALPRAZolam (XANAX) 1 MG tablet   Oral   Take 1 mg by mouth 2 (two) times daily as needed. For anxiety         . Butalbital-Acetaminophen (BUPAP) 50-300 MG TABS   Oral   Take 1 tablet by mouth every 6 (six) hours as needed.         . carisoprodol (SOMA) 350 MG tablet   Oral   Take 350 mg by mouth 2 (two) times daily as needed (neck pain).         Marland Kitchen diphenhydrAMINE (BENADRYL) 50 MG capsule   Oral   Take 50 mg by mouth every 6 (six) hours as needed for itching or sleep.         Marland Kitchen ibuprofen (ADVIL,MOTRIN) 600 MG tablet   Oral   Take 1 tablet (600 mg total) by mouth every 6 (six) hours as needed for pain.   30 tablet   0   . propranolol ER (INDERAL LA) 60 MG 24 hr capsule   Oral   Take 1 capsule (60 mg total) by mouth daily.   30 capsule   3     In am.    BP 105/66  Pulse 103  Temp(Src) 97.3 F (36.3 C) (Axillary)  Resp 12  SpO2 94% Physical Exam  Nursing note and vitals reviewed. Constitutional: She is oriented to person, place, and time. She appears well-developed and well-nourished.  obtunded, arousable to painful stimuli  HENT:  Head: Normocephalic.  Mouth/Throat: Oropharynx is clear and moist.  Eyes: Conjunctivae and EOM are normal. Pupils are equal, round, and reactive to light.  Neck: Normal range of motion.  Cardiovascular: Normal rate.   Pulmonary/Chest: Effort normal and breath sounds normal. No stridor. No respiratory distress. She has no wheezes. She has no rales. She exhibits no tenderness.  Musculoskeletal: Normal range of motion.  Neurological: She is alert and oriented to person, place, and time. GCS eye subscore is 2. GCS verbal subscore is 4. GCS motor subscore is 5.  Patient knows her name, year and she is in a hospital  Very difficult to understand  Psychiatric: She has a normal mood and affect.    ED Course  Procedures (including critical care time) Labs Review Labs Reviewed  SALICYLATE LEVEL - Abnormal; Notable for the following:    Salicylate Lvl <2.0 (*)    All other components within normal limits  URINALYSIS, ROUTINE W REFLEX MICROSCOPIC - Abnormal; Notable for the following:    Specific Gravity,  Urine 1.041 (*)    All other components within normal limits  CBC WITH DIFFERENTIAL - Abnormal; Notable for the following:    RBC 2.93 (*)    Hemoglobin 10.5 (*)    HCT 31.6 (*)    MCV 107.8 (*)    MCH 35.8 (*)    RDW 15.9 (*)    Neutrophils Relative % 21 (*)    Neutro Abs 1.3 (*)    Lymphocytes Relative 68 (*)    Lymphs Abs 4.4 (*)    All other components within normal limits  BASIC METABOLIC PANEL - Abnormal; Notable for the following:    Sodium 134 (*)    All other components within normal limits  ACETAMINOPHEN LEVEL  ETHANOL  URINE RAPID DRUG SCREEN (HOSP PERFORMED)  VALPROIC ACID LEVEL  POCT PREGNANCY, URINE   Imaging Review No results found.   Date: 08/25/2013  Rate: 96  Rhythm: normal sinus rhythm  QRS Axis: normal  Intervals: normal  ST/T Wave abnormalities: normal  Conduction Disutrbances:none  Narrative Interpretation:   Old EKG Reviewed: unchanged  0050  patient remains in similar state of altered mental status, so protecting her airway, responsive to voice consistently now. Mother and neighbor now in the room. States that she was seen normal earlier in the day.    MDM   1. Overdose, initial encounter   2. Altered mental status    Filed Vitals:   08/24/13 2309  BP: 105/66  Pulse: 103  Temp: 97.3 F (36.3 C)  TempSrc: Axillary  Resp: 12  SpO2: 94%     [name] is a 47 y.o. female altered mental status, likely secondary to overdose of unknown etiology, patient may taken buspar. Patient is somnolent but protecting her airway and arousable to painful stimuli. She is oriented x3. Nonresponsive to Narcan. EKG is normal sinus rhythm, normal QTC of 460 ms.  Ethanol, salicylate, acetaminophen levels are undetectable. Patient's blood work is grossly within normal limits. Urinalysis normal as well.  Patient's mother has arrived in the ED is unclear what the patient may have taken. Patient was seen earlier in the day and mentating and acting normally. As  per her mother the patient may have taken Depakote (for chronic migraines) which her doctor has been trying to wean her off.  UDS and HEAD CT pending   Pt will be admitted to a step down bed under the care of Dr. Allena Katz.  Medications  ondansetron (ZOFRAN) injection 4 mg (not administered)  sodium chloride 0.9 % bolus  1,000 mL (1,000 mLs Intravenous New Bag/Given 08/24/13 2358)  naloxone [facility]) injection 1 mg (1 mg Intravenous Given 08/25/13 0001)   Note: Portions of this report may have been transcribed using voice recognition software. Every effort was made to ensure accuracy; however, inadvertent computerized transcription errors may be present      Wynetta Emery, PA-C 08/25/13 0125

## 2013-08-24 NOTE — ED Notes (Addendum)
EKG old and new given to PA., Pisciotta.

## 2013-08-25 ENCOUNTER — Ambulatory Visit (HOSPITAL_COMMUNITY): Payer: Medicaid Other

## 2013-08-25 DIAGNOSIS — F411 Generalized anxiety disorder: Secondary | ICD-10-CM

## 2013-08-25 LAB — URINALYSIS, ROUTINE W REFLEX MICROSCOPIC
Glucose, UA: NEGATIVE mg/dL
Hgb urine dipstick: NEGATIVE
Ketones, ur: NEGATIVE mg/dL
Leukocytes, UA: NEGATIVE
Protein, ur: NEGATIVE mg/dL

## 2013-08-25 LAB — RAPID URINE DRUG SCREEN, HOSP PERFORMED
Barbiturates: POSITIVE — AB
Benzodiazepines: POSITIVE — AB

## 2013-08-25 LAB — CBC WITH DIFFERENTIAL/PLATELET
Basophils Absolute: 0 10*3/uL (ref 0.0–0.1)
Basophils Relative: 1 % (ref 0–1)
Eosinophils Absolute: 0.1 10*3/uL (ref 0.0–0.7)
Eosinophils Relative: 2 % (ref 0–5)
HCT: 31.6 % — ABNORMAL LOW (ref 36.0–46.0)
MCHC: 33.2 g/dL (ref 30.0–36.0)
MCV: 107.8 fL — ABNORMAL HIGH (ref 78.0–100.0)
Monocytes Absolute: 0.6 10*3/uL (ref 0.1–1.0)
RDW: 15.9 % — ABNORMAL HIGH (ref 11.5–15.5)

## 2013-08-25 LAB — SALICYLATE LEVEL: Salicylate Lvl: <2 mg/dL — ABNORMAL LOW (ref 2.8–20.0)

## 2013-08-25 LAB — BASIC METABOLIC PANEL
Calcium: 8.7 mg/dL (ref 8.4–10.5)
Creatinine, Ser: 0.54 mg/dL (ref 0.50–1.10)
GFR calc Af Amer: >90 mL/min (ref >90)

## 2013-08-25 MED ORDER — ONDANSETRON HCL 4 MG/2ML IJ SOLN: 4.0000 mg | Freq: Three times a day (TID) | INTRAMUSCULAR | Status: DC | PRN

## 2013-08-25 NOTE — ED Notes (Signed)
Pt. On cardiac monitor per PA, Pisciotta.

## 2013-08-25 NOTE — ED Notes (Signed)
Pt stated she wants to sign out ama. Press photographer, md patel and otter and icu informed of pt signing out ama.

## 2013-08-25 NOTE — ED Notes (Signed)
Called to give report nurse unavailable will call back.  

## 2013-08-25 NOTE — Consult Note (Addendum)
[facility] Initial Consult Note  [name]  WJX:[id number]  DOB: 1966-04-20  DOA: 08/24/2013 DoS: 08/25/2013   Referring physician: Wynetta Emery, PA-C PCP: Glori Bickers, MD  Reason for consult: admission  HPI: [name] is a 47 y.o. female with Past medical history of anxiety disorder, migraine. She presented today as she was found by EMS unresponsive in her car. She was found by her husband and her kids. As per the family and the mother the patient did not have any prior complaints. The patient denies any suicide ideation. There was no incontinence of bowel or bladder noted. The EMS was told by the patient that she has taken 4 tablets of busparion. Even though she denies any suicide ideation.  She has significant stress ongoing at present with her father being hospitalized.  The patient is able to provide and participate in history although she goes to sleep during the examination. She denies any headache chest pain shortness of breath nausea abdominal pain.  Review of Systems: as mentioned in the history of present illness.  A Comprehensive review of the other systems is negative.  Past Medical History  Diagnosis Date  . Tobacco use disorder   . Generalized anxiety disorder   . Panic disorder without agoraphobia   . Abnormal maternal glucose tolerance, complicating pregnancy, childbirth, or the puerperium, unspecified as to episode of care   . Migraine    Past Surgical History  Procedure Laterality Date  . Rhinoplasty    . Bilateral salpingoophorectomy  12/11    by DrBernardo  . Abdominal hysterectomy    . Cholecystectomy    . Tonsillectomy    . Adenoidectomy    . Cesarean section     Social History:  reports that she quit smoking about 3 years ago. She has never used smokeless tobacco. She reports that she does not drink alcohol or use illicit drugs. Patient is coming from home.  Allergies  Allergen Reactions  . Imitrex [Sumatriptan] Swelling     Throat swelling   . Relpax [Eletriptan] Swelling    Throat swelling  . Topamax [Topiramate] Shortness Of Breath and Swelling    Family History  Problem Relation Age of Onset  . Coronary artery disease Father   . Asthma Mother     Prior to Admission medications   Medication Sig Start Date End Date Taking? Authorizing Provider  Butalbital-Acetaminophen (BUPAP) 50-300 MG TABS Take 1 tablet by mouth every 6 (six) hours as needed.    Historical Provider, MD  carisoprodol (SOMA) 350 MG tablet Take 350 mg by mouth 2 (two) times daily as needed (neck pain).    Historical Provider, MD  diphenhydrAMINE (BENADRYL) 50 MG capsule Take 50 mg by mouth every 6 (six) hours as needed for itching or sleep.    Historical Provider, MD  ibuprofen (ADVIL,MOTRIN) 600 MG tablet Take 1 tablet (600 mg total) by mouth every 6 (six) hours as needed for pain. 02/25/13   Richardean Canal, MD  propranolol ER (INDERAL LA) 60 MG 24 hr capsule Take 1 capsule (60 mg total) by mouth daily. 03/19/13   Huston Foley, MD    Physical Exam: Filed Vitals:   08/24/13 2309  BP: 105/66  Pulse: 103  Temp: 97.3 F (36.3 C)  TempSrc: Axillary  Resp: 12  SpO2: 94%    General: Alert, drowsy but arousable and Oriented to Time, Place and Person. Appear in mild distress Eyes: PE dilated RRL,  ENT: Oral Mucosa clear dry. Neck: No  JVD, no Carotid Bruits  Cardiovascular: S1 and S2 Present, no Murmur, Peripheral Pulses Present Respiratory: Bilateral Air entry equal and Decreased, Clear to Auscultation,  No Crackles, no wheezes Abdomen: Bowel Sound Present, Soft and Non tender Skin: No Rash Extremities: No Pedal edema, no calf tenderness Neurologic: Mental status oriented with decreased attention and short-term memory loss, Cranial Nerves grossly intact, Motor strength generalized weakness with power 4/5, Sensation intact to light touch, reflexes intact, babinski equivocal, Proprioception present, Cerebellar test mild  dysmetria.   Labs:  Basic Metabolic Panel:  Recent Labs Lab 08/24/13 2340  NA 134*  K 3.5  CL 101  CO2 26  GLUCOSE 88  BUN 10  CREATININE 0.54  CALCIUM 8.7    Recent Labs Lab 08/24/13 2340  WBC 6.4  NEUTROABS 1.3*  HGB 10.5*  HCT 31.6*  MCV 107.8*  PLT 316   EKG: Independently reviewed. No prolonged QTC or arrhythmia noted.  Assessment/Plan Altered mental status of unknown etiology The patient presents with confusion lethargy generalized weakness. She has lack of attention, minimal dysarthria, dilated pupils, neck or coordination, generalized weakness.  Her U tox is positive for barbiturate benzodiazepine and opiates. Initial alcohol acetaminophen and salicylate levels are negative. A CBC and CMP does not show any significant change from her prior labs. The mother and patient both denies any suicidal ideation. The family wants to take the patient home.  The family was told that patient is significantly drowsy, and her current presentation require further workup including a CT scan of the head, and close monitoring with frequent neuro checks to identify the etiology. Family was also informed about possible progression of the condition with worsening mental status, possibility of aspiration due to inability to protect airway, possibility of trauma and inability to maintain balance. Patient was explained about possibility of prolonged morbidity and in worse case scenario death. Family decided that the patient home AGAINST MEDICAL ADVICE.  Patient was transferred back to care of the emergency physician.  Family Communication: Mother and family friend was at the bedside and were participants in the discussion.   Author: Lynden Oxford, MD [facility] Pager: [phone] 08/25/2013 1:54 AM    If 7PM-7AM, please contact night-coverage [email or web address] Password [email or web address]

## 2013-08-30 NOTE — ED Provider Notes (Signed)
Medical screening examination/treatment/procedure(s) were conducted as a shared visit with non-physician practitioner(s) and myself.  I personally evaluated the patient during the encounter  [name] here with overdose. Arousable to painful stimuli, protecting airway. EKG without any concerning findings. Patient admitted to [facility].  Dagmar Hait, MD 08/30/13 [id number]

## 2013-09-05 ENCOUNTER — Encounter: Payer: Self-pay | Admitting: *Deleted

## 2013-09-06 ENCOUNTER — Ambulatory Visit (INDEPENDENT_AMBULATORY_CARE_PROVIDER_SITE_OTHER): Payer: Medicaid Other | Admitting: Cardiology

## 2013-09-06 ENCOUNTER — Encounter: Payer: Self-pay | Admitting: Cardiology

## 2013-09-06 VITALS — BP 110/62 | HR 83 | Ht 65.0 in | Wt 128.0 lb

## 2013-09-06 DIAGNOSIS — E785 Hyperlipidemia, unspecified: Secondary | ICD-10-CM

## 2013-09-06 DIAGNOSIS — R079 Chest pain, unspecified: Secondary | ICD-10-CM

## 2013-09-06 NOTE — Progress Notes (Signed)
Patient ID: [name], female   DOB: 1966/01/14, 47 y.o.   MRN: [id number]    Patient Name: [name] Date of Encounter: 09/06/2013  Primary Care Provider:  Glori Bickers, MD Primary Cardiologist:  Tobias Alexander, MD  Patient Profile  Chest pain, syncope  Problem List   Past Medical History  Diagnosis Date  . Tobacco use disorder   . Generalized anxiety disorder   . Panic disorder without agoraphobia   . Abnormal maternal glucose tolerance, complicating pregnancy, childbirth, or the puerperium, unspecified as to episode of care   . Migraine     since age 33   Past Surgical History  Procedure Laterality Date  . Rhinoplasty    . Bilateral salpingoophorectomy  12/11    by DrBernardo  . Abdominal hysterectomy  2009  . Cholecystectomy    . Tonsillectomy    . Adenoidectomy    . Cesarean section  2004, 2006    Allergies  Allergies  Allergen Reactions  . Imitrex [Sumatriptan] Swelling    Throat swelling   . Relpax [Eletriptan] Swelling    Throat swelling  . Topamax [Topiramate] Shortness Of Breath and Swelling    HPI  48 year old patient with h/o migraine headaches and anxiety attacks for which she was taking different combinations of opioids, benzodiazepines and barbiturates. She was seen in [facility] after found unresponsive in her car, with altered mental status and found to be overdosed. She calls this episode a syncope and states that she has had 2 similar episodes in the past. She states that she is not physically active but is having palpitations with any activity like walking one flight of stairs. These are associated with chest tightness and are relieved by resting. She has been smoking for the last 30 years. She is concerned about CAD as multiple members of her family from mother side were affected by heart disease in as early age as 24'. She states that she has high cholesterol but never been treated for it.  Home Medications  Prior  to Admission medications   Medication Sig Start Date End Date Taking? Authorizing Provider  butalbital-acetaminophen-caffeine (FIORICET, ESGIC) 50-325-40 MG per tablet Take 1 tablet by mouth 3 (three) times daily as needed for headache.   Yes Historical Provider, MD  carisoprodol (SOMA) 350 MG tablet Take 350 mg by mouth 3 (three) times daily as needed for muscle spasms.   Yes Historical Provider, MD  diazepam (VALIUM) 10 MG tablet Take 10 mg by mouth 4 (four) times daily.   Yes Historical Provider, MD  divalproex (DEPAKOTE) 500 MG DR tablet Take 1,000-2,500 mg by mouth 3 (three) times daily. Take 2 tablets in the morning and 5 tablets at night   Yes Historical Provider, MD  MULTIPLE VITAMIN PO Take by mouth.   Yes Historical Provider, MD    Family History  Family History  Problem Relation Age of Onset  . Coronary artery disease Father   . Asthma Mother     Social History  History   Social History  . Marital Status: Divorced    Spouse Name: N/A    Number of Children: N/A  . Years of Education: N/A   Occupational History  . Not on file.   Social History Main Topics  . Smoking status: Former Smoker    Quit date: 08/27/2009  . Smokeless tobacco: Never Used  . Alcohol Use: No  . Drug Use: No  . Sexual Activity: No   Other Topics  Concern  . Not on file   Social History Narrative  . No narrative on file     Review of Systems General:  No chills, fever, night sweats or weight changes.  Cardiovascular:  No chest pain, dyspnea on exertion, edema, orthopnea, palpitations, paroxysmal nocturnal dyspnea. Dermatological: No rash, lesions/masses Respiratory: No cough, dyspnea Urologic: No hematuria, dysuria Abdominal:   No nausea, vomiting, diarrhea, bright red blood per rectum, melena, or hematemesis Neurologic:  No visual changes, wkns, changes in mental status. All other systems reviewed and are otherwise negative except as noted above.  Physical Exam  Blood pressure  110/62, pulse 83, height 5\' 5"  (1.651 m), weight 128 lb (58.06 kg).  General: Patient is crying during an interview. She appears to have very slow comprehension and responses.  Neuro: Alert and oriented X 3. Moves all extremities spontaneously. HEENT: Normal, poor dental hygiene  Neck: Supple without bruits or JVD. Lungs:  Resp regular and unlabored, CTA. Heart: RRR no s3, s4, or murmurs. Abdomen: Soft, non-tender, non-distended, BS + x 4.  Extremities: No clubbing, cyanosis or edema. DP/PT/Radials 2+ and equal bilaterally.  Accessory Clinical Findings  ECG -   Assessment & Plan  47 year old female with h/o anxiety, panic attacks, migraine headaches   1. Syncope - not a real syncope as the patient was found with altered mental status, overdosed on barbiturates, benzodiazepines and opioids at the time. She states that she was taken off those meds, but still has some reserves at home for her headaches. She currently appears under the influence of those medications with very slow responses and comprehension. It is possible that she is receiving opioids from several providers. The patient was advised not to drive.    2. Chest pain - the patient has typical symptoms and has significant family history. Moreover she is a lifelong smoker. We will order exercise nuclear stress test, chcek lipid profile.   Follow up in 3 weeks.    Tobias Alexander, Rexene Edison, MD 09/06/2013, 4:51 PM

## 2013-09-06 NOTE — Patient Instructions (Addendum)
Scheduled Stress Myoview   Follow instructions given   Schedule Lipid Panel same day as Myoview   Follow up with Dr.Nelson after test

## 2013-09-19 ENCOUNTER — Ambulatory Visit (HOSPITAL_COMMUNITY): Payer: Medicaid Other | Attending: Cardiovascular Disease | Admitting: Radiology

## 2013-09-19 VITALS — BP 122/86 | HR 71 | Ht 65.0 in | Wt 132.0 lb

## 2013-09-19 DIAGNOSIS — R079 Chest pain, unspecified: Secondary | ICD-10-CM

## 2013-09-19 DIAGNOSIS — E785 Hyperlipidemia, unspecified: Secondary | ICD-10-CM

## 2013-09-19 DIAGNOSIS — R0609 Other forms of dyspnea: Secondary | ICD-10-CM | POA: Insufficient documentation

## 2013-09-19 DIAGNOSIS — F17200 Nicotine dependence, unspecified, uncomplicated: Secondary | ICD-10-CM | POA: Insufficient documentation

## 2013-09-19 DIAGNOSIS — R0602 Shortness of breath: Secondary | ICD-10-CM

## 2013-09-19 DIAGNOSIS — R5381 Other malaise: Secondary | ICD-10-CM | POA: Insufficient documentation

## 2013-09-19 DIAGNOSIS — R0789 Other chest pain: Secondary | ICD-10-CM | POA: Insufficient documentation

## 2013-09-19 DIAGNOSIS — Z8249 Family history of ischemic heart disease and other diseases of the circulatory system: Secondary | ICD-10-CM | POA: Insufficient documentation

## 2013-09-19 DIAGNOSIS — R000 Tachycardia, unspecified: Secondary | ICD-10-CM | POA: Insufficient documentation

## 2013-09-19 DIAGNOSIS — R55 Syncope and collapse: Secondary | ICD-10-CM

## 2013-09-19 DIAGNOSIS — R0989 Other specified symptoms and signs involving the circulatory and respiratory systems: Secondary | ICD-10-CM | POA: Insufficient documentation

## 2013-09-19 MED ORDER — REGADENOSON 0.4 MG/5ML IV SOLN
0.4000 mg | Freq: Once | INTRAVENOUS | Status: AC
  Administered 2013-09-19: 0.4 mg via INTRAVENOUS

## 2013-09-19 MED ORDER — TECHNETIUM TC 99M SESTAMIBI GENERIC - CARDIOLITE
30.0000 | Freq: Once | INTRAVENOUS | Status: AC | PRN
  Administered 2013-09-19: 30 via INTRAVENOUS

## 2013-09-19 MED ORDER — TECHNETIUM TC 99M SESTAMIBI GENERIC - CARDIOLITE
10.0000 | Freq: Once | INTRAVENOUS | Status: AC | PRN
  Administered 2013-09-19: 10 via INTRAVENOUS

## 2013-09-19 NOTE — Progress Notes (Signed)
[facility] SITE 3 NUCLEAR MED [address] [place], [place] [address] [phone]    Cardiology Nuclear Med Study  [name] is a 47 y.o. female     MRN : [id number]     DOB: 24-Dec-1966  Procedure Date: 09/19/2013  Nuclear Med Background Indication for Stress Test:  Evaluation for Ischemia History:  '04 Echo:60%; 8/14 ED with overdose of barbituates/opiates Cardiac Risk Factors: Family History - CAD, Lipids and Smoker  Symptoms:  Chest Pain and Tightness, DOE, Fatigue and Rapid HR   Nuclear Pre-Procedure Caffeine/Decaff Intake:  None NPO After: 8:00pm   Lungs:  Clear. O2 Sat: 99% on room air. IV 0.9% NS with Angio Cath:  22g  IV Site: R Antecubital  IV Started by:  Bonnita Levan, RN  Chest Size (in):  36 Cup Size: C  Height: 5\' 5"  (1.651 m)  Weight:  132 lb (59.875 kg)  BMI:  Body mass index is 21.97 kg/(m^2). Tech Comments:  Patient left the office with IV in while waiting to have stress images done.      Nuclear Med Study 1 or 2 day study: 1 day  Stress Test Type:  Stress  Reading MD: Charlton Haws, MD  Order Authorizing Provider:  Tobias Alexander, MD  Resting Radionuclide: Technetium 47m Sestamibi  Resting Radionuclide Dose: 11.0 mCi   Stress Radionuclide:  Technetium 71m Sestamibi  Stress Radionuclide Dose: 33.0 mCi           Stress Protocol Rest HR: 71 Stress HR: 109  Rest BP: 122/86 Stress BP: 124/85  Exercise Time (min): n/a METS: n/a   Predicted Max HR: 174 bpm % Max HR: 62.64 bpm Rate Pressure Product: 84166   Dose of Adenosine (mg):  n/a Dose of Lexiscan: 0.4 mg  Dose of Atropine (mg): n/a Dose of Dobutamine: n/a mcg/kg/min (at max HR)  Stress Test Technologist: Smiley Houseman, CMA-N  Nuclear Technologist:  Domenic Polite, CNMT     Rest Procedure:  Myocardial perfusion imaging was performed at rest 45 minutes following the intravenous administration of Technetium 49m Sestamibi.  Rest ECG: NSR nonspecific ST/T wave changes  Stress  Procedure:  The patient received IV Lexiscan 0.4 mg over 15-seconds.  Technetium 48m Sestamibi injected at 30-seconds.  She c/o nausea and dizziness with Lexiscan.  Quantitative spect images were obtained after a 45 minute delay.   Stress ECG: No significant change from baseline ECG  QPS Raw Data Images:  Normal; no motion artifact; normal heart/lung ratio. Stress Images:  Normal homogeneous uptake in all areas of the myocardium. Rest Images:  Normal homogeneous uptake in all areas of the myocardium. Subtraction (SDS):  Normal Transient Ischemic Dilatation (Normal <1.22):  n/a Lung/Heart Ratio (Normal <0.45):  0.43  Quantitative Gated Spect Images QGS EDV:  61 ml QGS ESV:  23 ml  Impression Exercise Capacity:  Lexiscan with no exercise. BP Response:  Normal blood pressure response. Clinical Symptoms:  There is dyspnea. ECG Impression:  No significant ST segment change suggestive of ischemia. Comparison with Prior Nuclear Study: No previous nuclear study performed  Overall Impression:  Normal stress nuclear study.  LV Ejection Fraction: 63%.  LV Wall Motion:  NL LV Function; NL Wall Motion   Charlton Haws

## 2013-09-25 ENCOUNTER — Encounter (HOSPITAL_COMMUNITY): Payer: Medicaid Other

## 2013-09-26 ENCOUNTER — Telehealth: Payer: Self-pay | Admitting: Cardiology

## 2013-09-26 NOTE — Telephone Encounter (Signed)
**Note De-Identified Mansa Willers Obfuscation** Pt advised, she verbalized understanding. She states that she realized that her PCP ordered the Cervical MRI.

## 2013-09-26 NOTE — Telephone Encounter (Signed)
**Note De-identified Davan Nawabi Obfuscation** LMTCB

## 2013-09-26 NOTE — Telephone Encounter (Signed)
New Problem  Pt calling for results of stress test and also inquires to know if she was referred to [facility] for a cervical MRI from this office.

## 2013-09-26 NOTE — Telephone Encounter (Signed)
Hi Larita Fife, Would you let her know that her stress test was completely normal? Thank you! Chania Kochanski  Impression  Exercise Capacity: Lexiscan with no exercise.  BP Response: Normal blood pressure response.  Clinical Symptoms: There is dyspnea.  ECG Impression: No significant ST segment change suggestive of ischemia.  Comparison with Prior Nuclear Study: No previous nuclear study performed  Overall Impression: Normal stress nuclear study.  LV Ejection Fraction: 63%. LV Wall Motion: NL LV Function; NL Wall Motion  Charlton Haws

## 2013-09-26 NOTE — Telephone Encounter (Signed)
**Note De-Identified [name] Obfuscation** Pt would like results of stress test that was done on 9/24.   Also, according to OV notes pt was to f/u post test but there is no appointment or recall scheduled. Please advise.

## 2013-10-02 ENCOUNTER — Ambulatory Visit: Payer: Medicaid Other | Admitting: Cardiology

## 2013-10-08 ENCOUNTER — Ambulatory Visit: Payer: Medicaid Other | Admitting: Cardiology

## 2013-11-02 ENCOUNTER — Encounter: Payer: Self-pay | Admitting: Cardiology

## 2013-11-02 ENCOUNTER — Ambulatory Visit (INDEPENDENT_AMBULATORY_CARE_PROVIDER_SITE_OTHER): Payer: Medicaid Other | Admitting: Cardiology

## 2013-11-02 VITALS — BP 129/87 | HR 88 | Ht 65.0 in | Wt 131.6 lb

## 2013-11-02 DIAGNOSIS — F17200 Nicotine dependence, unspecified, uncomplicated: Secondary | ICD-10-CM

## 2013-11-02 DIAGNOSIS — E785 Hyperlipidemia, unspecified: Secondary | ICD-10-CM

## 2013-11-02 DIAGNOSIS — R079 Chest pain, unspecified: Secondary | ICD-10-CM

## 2013-11-02 NOTE — Patient Instructions (Signed)
Your physician recommends that you schedule a follow-up appointment in: AS NEEDED  

## 2013-11-02 NOTE — Progress Notes (Signed)
      HPI: 47 year old female for followup of chest pain. Patient recently seen by Dr. Delton See for atypical chest pain. Nuclear study in September of 2014 showed an ejection fraction of 63% and normal perfusion. Since her study she denies dyspnea, exertional chest pain or syncope.  Current Outpatient Prescriptions  Medication Sig Dispense Refill  . butalbital-acetaminophen-caffeine (FIORICET, ESGIC) 50-325-40 MG per tablet Take 1 tablet by mouth 3 (three) times daily as needed for headache.      . diazepam (VALIUM) 10 MG tablet Take 10 mg by mouth 4 (four) times daily.      . MULTIPLE VITAMIN PO Take by mouth.      . Oxycodone-Acetaminophen (PERCOCET PO) Take 5 mg by mouth as directed.       No current facility-administered medications for this visit.     Past Medical History  Diagnosis Date  . Tobacco use disorder   . Generalized anxiety disorder   . Panic disorder without agoraphobia   . Abnormal maternal glucose tolerance, complicating pregnancy, childbirth, or the puerperium, unspecified as to episode of care   . Migraine     since age 5    Past Surgical History  Procedure Laterality Date  . Rhinoplasty    . Bilateral salpingoophorectomy  12/11    by DrBernardo  . Abdominal hysterectomy  2009  . Cholecystectomy    . Tonsillectomy    . Adenoidectomy    . Cesarean section  2004, 2006    History   Social History  . Marital Status: Divorced    Spouse Name: N/A    Number of Children: N/A  . Years of Education: N/A   Occupational History  . Not on file.   Social History Main Topics  . Smoking status: Former Smoker    Quit date: 08/27/2009  . Smokeless tobacco: Never Used  . Alcohol Use: No  . Drug Use: No  . Sexual Activity: No   Other Topics Concern  . Not on file   Social History Narrative  . No narrative on file    ROS: no fevers or chills, productive cough, hemoptysis, dysphasia, odynophagia, melena, hematochezia, dysuria, hematuria, rash, seizure  activity, orthopnea, PND, pedal edema, claudication. Remaining systems are negative.  Physical Exam: Well-developed well-nourished in no acute distress.  Skin is warm and dry.  HEENT is normal.  Neck is supple.  Chest is clear to auscultation with normal expansion.  Cardiovascular exam is regular rate and rhythm.  Abdominal exam nontender or distended. No masses palpated. Extremities show no edema. neuro grossly intact  Electrocardiogram 09/06/2013 showed sinus rhythm with no ST changes.

## 2013-11-02 NOTE — Assessment & Plan Note (Signed)
Patient counseled on discontinuing. 

## 2013-11-02 NOTE — Assessment & Plan Note (Signed)
No further symptoms. Nuclear study negative. No plans for further workup at this point.

## 2013-11-02 NOTE — Assessment & Plan Note (Signed)
Management per primary care. 

## 2013-11-08 ENCOUNTER — Emergency Department (HOSPITAL_COMMUNITY)
Admission: EM | Admit: 2013-11-08 | Discharge: 2013-11-08 | Disposition: A | Discharge status: Home / Self Care | Payer: No Typology Code available for payment source | Attending: Emergency Medicine | Admitting: Emergency Medicine

## 2013-11-08 ENCOUNTER — Emergency Department (HOSPITAL_COMMUNITY): Payer: No Typology Code available for payment source

## 2013-11-08 ENCOUNTER — Encounter (HOSPITAL_COMMUNITY): Payer: Self-pay | Admitting: Emergency Medicine

## 2013-11-08 DIAGNOSIS — W010XXA Fall on same level from slipping, tripping and stumbling without subsequent striking against object, initial encounter: Secondary | ICD-10-CM | POA: Insufficient documentation

## 2013-11-08 DIAGNOSIS — S139XXA Sprain of joints and ligaments of unspecified parts of neck, initial encounter: Secondary | ICD-10-CM | POA: Insufficient documentation

## 2013-11-08 DIAGNOSIS — T148XXA Other injury of unspecified body region, initial encounter: Secondary | ICD-10-CM

## 2013-11-08 DIAGNOSIS — Z87891 Personal history of nicotine dependence: Secondary | ICD-10-CM | POA: Insufficient documentation

## 2013-11-08 DIAGNOSIS — Z79899 Other long term (current) drug therapy: Secondary | ICD-10-CM | POA: Insufficient documentation

## 2013-11-08 DIAGNOSIS — Y9269 Other specified industrial and construction area as the place of occurrence of the external cause: Secondary | ICD-10-CM | POA: Insufficient documentation

## 2013-11-08 DIAGNOSIS — IMO0002 Reserved for concepts with insufficient information to code with codable children: Secondary | ICD-10-CM | POA: Insufficient documentation

## 2013-11-08 DIAGNOSIS — S0990XA Unspecified injury of head, initial encounter: Secondary | ICD-10-CM | POA: Insufficient documentation

## 2013-11-08 DIAGNOSIS — S298XXA Other specified injuries of thorax, initial encounter: Secondary | ICD-10-CM | POA: Insufficient documentation

## 2013-11-08 DIAGNOSIS — S46909A Unspecified injury of unspecified muscle, fascia and tendon at shoulder and upper arm level, unspecified arm, initial encounter: Secondary | ICD-10-CM | POA: Insufficient documentation

## 2013-11-08 DIAGNOSIS — F411 Generalized anxiety disorder: Secondary | ICD-10-CM | POA: Insufficient documentation

## 2013-11-08 DIAGNOSIS — S161XXA Strain of muscle, fascia and tendon at neck level, initial encounter: Secondary | ICD-10-CM

## 2013-11-08 DIAGNOSIS — Y9389 Activity, other specified: Secondary | ICD-10-CM | POA: Insufficient documentation

## 2013-11-08 DIAGNOSIS — F410 Panic disorder [episodic paroxysmal anxiety] without agoraphobia: Secondary | ICD-10-CM | POA: Insufficient documentation

## 2013-11-08 DIAGNOSIS — S4980XA Other specified injuries of shoulder and upper arm, unspecified arm, initial encounter: Secondary | ICD-10-CM | POA: Insufficient documentation

## 2013-11-08 DIAGNOSIS — G43909 Migraine, unspecified, not intractable, without status migrainosus: Secondary | ICD-10-CM | POA: Insufficient documentation

## 2013-11-08 MED ORDER — DIAZEPAM 5 MG PO TABS
5.0000 mg | ORAL_TABLET | Freq: Once | ORAL | Status: AC
  Administered 2013-11-08: 5 mg via ORAL
  Filled 2013-11-08: qty 1

## 2013-11-08 MED ORDER — KETOROLAC TROMETHAMINE 30 MG/ML IJ SOLN
30.0000 mg | Freq: Once | INTRAMUSCULAR | Status: AC
  Administered 2013-11-08: 30 mg via INTRAMUSCULAR
  Filled 2013-11-08: qty 1

## 2013-11-08 MED ORDER — IBUPROFEN 600 MG PO TABS: 600.0000 mg | ORAL_TABLET | Freq: Four times a day (QID) | ORAL | Status: DC | PRN

## 2013-11-08 NOTE — ED Provider Notes (Signed)
CSN: [id number]     Arrival date & time 11/08/13  4098 History   First MD Initiated Contact with Patient 11/08/13 [phone]     Chief Complaint  Patient presents with  . Fall   (Consider location/radiation/quality/duration/timing/severity/associated sxs/prior Treatment) HPI  This is a 47 year old female who presents following a fall. Patient fell on a wet store for on Tuesday morning. She has since her pain management doctor who recommended that she be evaluated. Patient reports left head, with left chest, left arm and left knee pain. She has been ambulatory. She took a soma last night with some relief of her pain. She denies any loss of consciousness during this event. She denies any aspirin, Plavix, or Coumadin use. Past Medical History  Diagnosis Date  . Tobacco use disorder   . Generalized anxiety disorder   . Panic disorder without agoraphobia   . Abnormal maternal glucose tolerance, complicating pregnancy, childbirth, or the puerperium, unspecified as to episode of care   . Migraine     since age 77   Past Surgical History  Procedure Laterality Date  . Rhinoplasty    . Bilateral salpingoophorectomy  12/11    by DrBernardo  . Abdominal hysterectomy  2009  . Cholecystectomy    . Tonsillectomy    . Adenoidectomy    . Cesarean section  2004, 2006   Family History  Problem Relation Age of Onset  . Coronary artery disease Father   . Asthma Mother    History  Substance Use Topics  . Smoking status: Former Smoker    Quit date: 08/27/2009  . Smokeless tobacco: Never Used  . Alcohol Use: No   OB History   Grav Para Term Preterm Abortions TAB SAB Ect Mult Living                 Review of Systems  Respiratory: Negative for chest tightness and shortness of breath.   Cardiovascular: Negative for chest pain.  Gastrointestinal: Negative for abdominal pain.  Musculoskeletal: Positive for neck pain.       Left knee and shoulder pain  Skin: Positive for wound.  Neurological:  Positive for headaches.  All other systems reviewed and are negative.    Allergies  Imitrex; Relpax; and Topamax  Home Medications   Current Outpatient Rx  Name  Route  Sig  Dispense  Refill  . butalbital-acetaminophen-caffeine (FIORICET, ESGIC) 50-325-40 MG per tablet   Oral   Take 1 tablet by mouth 3 (three) times daily as needed for headache.         . carisoprodol (SOMA) 350 MG tablet   Oral   Take 350 mg by mouth 4 (four) times daily as needed for muscle spasms.         . diazepam (VALIUM) 10 MG tablet   Oral   Take 10 mg by mouth 4 (four) times daily.         . Multiple Vitamin (MULTIVITAMIN WITH MINERALS) TABS tablet   Oral   Take 1 tablet by mouth daily.         Marland Kitchen oxycodone (OXY-IR) 5 MG capsule   Oral   Take 5 mg by mouth every 4 (four) hours as needed for pain.         Marland Kitchen ibuprofen (ADVIL,MOTRIN) 600 MG tablet   Oral   Take 1 tablet (600 mg total) by mouth every 6 (six) hours as needed.   30 tablet   0    BP 109/77  Pulse 87  Temp(Src) 98.5 F (36.9 C) (Oral)  Resp 16  Ht 5\' 5"  (1.651 m)  Wt 124 lb (56.246 kg)  BMI 20.63 kg/m2  SpO2 100% Physical Exam  Nursing note and vitals reviewed. Constitutional: She is oriented to person, place, and time. She appears well-developed and well-nourished. No distress.  HENT:  Head: Normocephalic and atraumatic.  Mouth/Throat: Oropharynx is clear and moist.  Eyes: Pupils are equal, round, and reactive to light.  Neck: Normal range of motion. Neck supple.  No midline cervical spine tenderness, spasm and tenderness to palpation over the left paraspinous muscles  Cardiovascular: Normal rate, regular rhythm and normal heart sounds.   Pulmonary/Chest: Effort normal and breath sounds normal. No respiratory distress.  Abdominal: Soft. There is no tenderness.  Musculoskeletal: Normal range of motion. She exhibits no edema.  Abrasion to the left knee  Neurological: She is alert and oriented to person, place,  and time.  Skin: Skin is warm and dry.  Dime sized abrasion to the left knee  Psychiatric: She has a normal mood and affect.    ED Course  Procedures (including critical care time) Labs Review Labs Reviewed - No data to display Imaging Review Dg Chest 2 View  11/08/2013   CLINICAL DATA:  Fall, left shoulder and knee pain  EXAM: CHEST  2 VIEW  COMPARISON:  Prior chest x-ray 05/17/2012  FINDINGS: The lungs are clear and negative for focal airspace consolidation, pulmonary edema or suspicious pulmonary nodule. No pleural effusion or pneumothorax. Mild central bronchitic changes similar to prior. Cardiac and mediastinal contours are within normal limits. No acute fracture or lytic or blastic osseous lesions. The visualized upper abdominal bowel gas pattern is unremarkable. Surgical clips noted in the right mid abdomen. Query prior cholecystectomy?  IMPRESSION: No active cardiopulmonary disease.   Electronically Signed   By: Malachy Moan M.D.   On: 11/08/2013 09:10   Dg Shoulder Left  11/08/2013   CLINICAL DATA:  Fall.  Pain.  EXAM: LEFT SHOULDER - 2+ VIEW  COMPARISON:  None.  FINDINGS: There is no evidence of fracture or dislocation. There is no evidence of arthropathy or other focal bone abnormality. Soft tissues are unremarkable.  IMPRESSION: Negative.   Electronically Signed   By: Maisie Fus  Register   On: 11/08/2013 09:08   Dg Knee Complete 4 Views Left  11/08/2013   CLINICAL DATA:  47 year old female status post fall with pain. Initial encounter.  EXAM: LEFT KNEE - COMPLETE 4+ VIEW  COMPARISON:  None.  FINDINGS: No joint effusion identified. Bone mineralization is within normal limits. Patella intact. Joint spaces appear preserved. No fracture or dislocation identified.  IMPRESSION: No acute fracture or dislocation identified about the left knee.   Electronically Signed   By: Augusto Gamble M.D.   On: 11/08/2013 09:13    EKG Interpretation   None       MDM   1. Cervical strain, acute,  initial encounter   2. Abrasion    This is a 47 year old female who presents with a fall 2 days ago. She is nontoxic-appearing on exam and her vital signs are within normal limits. She has an abrasion to her left knee but otherwise no evidence of trauma. She has no midline cervical pain and have low suspicion for fracture. Plain films were obtained of the left shoulder, left knee, and chest. These are negative. Patient was given Valium and Toradol for cervical strain. Advised patient that all x-rays are negative. Patient encouraged to use ibuprofen as an  anti-inflammatory and Valium for muscle spasm. Patient reports that she has a prescription for Valium at home. Patient is requesting soma but I have referred her back to her pain management doctor for this prescription.  After history, exam, and medical workup I feel the patient has been appropriately medically screened and is safe for discharge home. Pertinent diagnoses were discussed with the patient. Patient was given return precautions.     Shon Baton, MD 11/08/13 [phone]

## 2013-11-08 NOTE — ED Notes (Signed)
Per pt was at store and slipped on wet mopped floor. Pt reports hit to posterior left portion of head and injury to left side of ribs.

## 2013-12-12 ENCOUNTER — Telehealth: Payer: Self-pay | Admitting: Cardiology

## 2013-12-12 NOTE — Telephone Encounter (Signed)
Left message for pt to call.

## 2013-12-12 NOTE — Telephone Encounter (Signed)
New message    Pt said her children said she was having "spells".  She would appear to fall asleep while in the middle of a conversation.  Pt is not aware of this happening.  She thinks she is not getting enough blood to her brain and may need a blood thinner.  Pls advise.

## 2014-01-08 NOTE — Telephone Encounter (Signed)
Spoke with pt, she reports her pain medicine doctor made changes in her medicine and the spells of falling asleep have gotten better. She is having no problems at this time and has a follow up with the pain doctor this week.

## 2014-05-21 ENCOUNTER — Emergency Department (HOSPITAL_COMMUNITY)
Admission: EM | Admit: 2014-05-21 | Discharge: 2014-05-21 | Disposition: A | Discharge status: Home / Self Care | Payer: Medicaid Other | Attending: Emergency Medicine | Admitting: Emergency Medicine

## 2014-05-21 ENCOUNTER — Emergency Department (HOSPITAL_COMMUNITY): Payer: Medicaid Other

## 2014-05-21 ENCOUNTER — Encounter (HOSPITAL_COMMUNITY): Payer: Self-pay | Admitting: Emergency Medicine

## 2014-05-21 DIAGNOSIS — Z87891 Personal history of nicotine dependence: Secondary | ICD-10-CM | POA: Insufficient documentation

## 2014-05-21 DIAGNOSIS — Z79899 Other long term (current) drug therapy: Secondary | ICD-10-CM | POA: Insufficient documentation

## 2014-05-21 DIAGNOSIS — Z3202 Encounter for pregnancy test, result negative: Secondary | ICD-10-CM | POA: Insufficient documentation

## 2014-05-21 DIAGNOSIS — F410 Panic disorder [episodic paroxysmal anxiety] without agoraphobia: Secondary | ICD-10-CM | POA: Insufficient documentation

## 2014-05-21 DIAGNOSIS — Z8679 Personal history of other diseases of the circulatory system: Secondary | ICD-10-CM | POA: Insufficient documentation

## 2014-05-21 LAB — I-STAT CHEM 8, ED
BUN: 4 mg/dL — AB (ref 6–23)
CALCIUM ION: 1.16 mmol/L (ref 1.12–1.23)
CREATININE: 0.5 mg/dL (ref 0.50–1.10)
Chloride: 100 mEq/L (ref 96–112)
Glucose, Bld: 113 mg/dL — ABNORMAL HIGH (ref 70–99)
HCT: 43 % (ref 36.0–46.0)
HEMOGLOBIN: 14.6 g/dL (ref 12.0–15.0)
Potassium: 3.7 mEq/L (ref 3.7–5.3)
Sodium: 137 mEq/L (ref 137–147)
TCO2: 26 mmol/L (ref 0–100)

## 2014-05-21 LAB — CBC
HCT: 39.4 % (ref 36.0–46.0)
Hemoglobin: 13.7 g/dL (ref 12.0–15.0)
MCH: 33.4 pg (ref 26.0–34.0)
MCHC: 34.8 g/dL (ref 30.0–36.0)
MCV: 96.1 fL (ref 78.0–100.0)
Platelets: 398 10*3/uL (ref 150–400)
RBC: 4.1 MIL/uL (ref 3.87–5.11)
RDW: 12.7 % (ref 11.5–15.5)
WBC: 10.5 10*3/uL (ref 4.0–10.5)

## 2014-05-21 LAB — BASIC METABOLIC PANEL
BUN: 6 mg/dL (ref 6–23)
CO2: 25 meq/L (ref 19–32)
CREATININE: 0.46 mg/dL — AB (ref 0.50–1.10)
Calcium: 9.8 mg/dL (ref 8.4–10.5)
Chloride: 97 mEq/L (ref 96–112)
GFR calc Af Amer: >90 mL/min (ref >90)
GFR calc non Af Amer: >90 mL/min (ref >90)
Glucose, Bld: 142 mg/dL — ABNORMAL HIGH (ref 70–99)
Potassium: 4 mEq/L (ref 3.7–5.3)
Sodium: 139 mEq/L (ref 137–147)

## 2014-05-21 LAB — POC URINE PREG, ED: Preg Test, Ur: NEGATIVE

## 2014-05-21 MED ORDER — PROMETHAZINE HCL 25 MG PO TABS: 25.0000 mg | ORAL_TABLET | Freq: Four times a day (QID) | ORAL | Status: DC | PRN

## 2014-05-21 MED ORDER — LEVONORGESTREL 0.75 MG PO TABS: 1.5000 mg | ORAL_TABLET | Freq: Once | ORAL | Status: DC

## 2014-05-21 MED ORDER — CEFIXIME 400 MG PO TABS: 400.0000 mg | ORAL_TABLET | Freq: Once | ORAL | Status: DC

## 2014-05-21 MED ORDER — SODIUM CHLORIDE 0.9 % IV BOLUS (SEPSIS)
1000.0000 mL | Freq: Once | INTRAVENOUS | Status: AC
  Administered 2014-05-21: 1000 mL via INTRAVENOUS

## 2014-05-21 MED ORDER — AZITHROMYCIN 1 G PO PACK: 1.0000 g | PACK | Freq: Once | ORAL | Status: DC

## 2014-05-21 MED ORDER — CIPROFLOXACIN HCL 500 MG PO TABS: 500.0000 mg | ORAL_TABLET | Freq: Once | ORAL | Status: DC

## 2014-05-21 MED ORDER — CEFPODOXIME PROXETIL 200 MG PO TABS: 400.0000 mg | ORAL_TABLET | Freq: Once | ORAL | Status: DC

## 2014-05-21 MED ORDER — IOHEXOL 300 MG/ML  SOLN
80.0000 mL | Freq: Once | INTRAMUSCULAR | Status: AC | PRN
  Administered 2014-05-21: 80 mL via INTRAVENOUS

## 2014-05-21 MED ORDER — METRONIDAZOLE 500 MG PO TABS: 2000.0000 mg | ORAL_TABLET | Freq: Once | ORAL | Status: DC

## 2014-05-21 NOTE — ED Provider Notes (Signed)
CSN: [id number]     Arrival date & time 05/21/14  1158 History   First MD Initiated Contact with Patient 05/21/14 1213     Chief Complaint  Patient presents with  . Oral Swelling     (Consider location/radiation/quality/duration/timing/severity/associated sxs/prior Treatment) Patient is a 48 y.o. female presenting with general illness. The history is provided by the patient.  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 Her mouth seized up and she endorses sensation of tongue swelling Relieved by:  Nothing Worsened by:  Nothing Associated symptoms: no abdominal pain, no chest pain, no cough, no fever, no shortness of breath and no vomiting     Past Medical History  Diagnosis Date  . Tobacco use disorder   . Generalized anxiety disorder   . Panic disorder without agoraphobia   . Abnormal maternal glucose tolerance, complicating pregnancy, childbirth, or the puerperium, unspecified as to episode of care   . Migraine     since age 89   Past Surgical History  Procedure Laterality Date  . Rhinoplasty    . Bilateral salpingoophorectomy  12/11    by DrBernardo  . Abdominal hysterectomy  2009  . Cholecystectomy    . Tonsillectomy    . Adenoidectomy    . Cesarean section  2004, 2006   Family History  Problem Relation Age of Onset  . Coronary artery disease Father   . Asthma Mother    History  Substance Use Topics  . Smoking status: Former Smoker    Quit date: 08/27/2009  . Smokeless tobacco: Never Used  . Alcohol Use: No   OB History   Grav Para Term Preterm Abortions TAB SAB Ect Mult Living                 Review of Systems  Constitutional: Negative for fever.  Respiratory: Negative for cough and shortness of breath.   Cardiovascular: Negative for chest pain and leg swelling.   Gastrointestinal: Negative for vomiting and abdominal pain.  All other systems reviewed and are negative.     Allergies  Imitrex; Relpax; and Topamax  Home Medications   Prior to Admission medications   Medication Sig Start Date End Date Taking? Authorizing Provider  ALPRAZolam Prudy Feeler(XANAX) 1 MG tablet Take 1 mg by mouth 4 (four) times daily as needed for anxiety.   Yes Historical Provider, MD  carisoprodol (SOMA) 350 MG tablet Take 350 mg by mouth 4 (four) times daily as needed for muscle spasms.   Yes Historical Provider, MD  Multiple Vitamin (MULTIVITAMIN WITH MINERALS) TABS tablet Take 1 tablet by mouth daily.   Yes Historical Provider, MD  oxycodone (OXY-IR) 5 MG capsule Take 5 mg by mouth every 4 (four) hours as needed for pain.   Yes Historical Provider, MD   BP 139/110  Pulse 109  Temp(Src) 98.4 F (36.9 C) (Oral)  Resp 20  SpO2 98% Physical Exam  Nursing note and vitals reviewed. Constitutional: She is oriented to person, place, and time. She appears well-developed and well-nourished. No distress.  HENT:  Head: Normocephalic and atraumatic.  Mouth/Throat: No oropharyngeal exudate.  No appreciable tongue swelling  Eyes: EOM are normal. Pupils are equal, round, and reactive to light.  Neck: Normal range of motion. Neck supple. No JVD present. No tracheal deviation present. No thyromegaly present.  Cardiovascular: Normal  rate and regular rhythm.  Exam reveals no friction rub.   No murmur heard. Pulmonary/Chest: Effort normal and breath sounds normal. No stridor. No respiratory distress. She has no wheezes. She has no rales.  Abdominal: Soft. She exhibits no distension. There is no tenderness. There is no rebound.  Musculoskeletal: Normal range of motion. She exhibits no edema.  Neurological: She is alert and oriented to person, place, and time.  Skin: She is not diaphoretic.    ED Course  Procedures (including critical care time) Labs Review Labs Reviewed  CBC  BASIC  METABOLIC PANEL  I-STAT CHEM 8, ED    Imaging Review Ct Soft Tissue Neck W Contrast  05/21/2014   CLINICAL DATA:  Throat swelling. Tongue swelling. Query epiglottitis.  EXAM: CT NECK WITH CONTRAST  TECHNIQUE: Multidetector CT imaging of the neck was performed using the standard protocol following the bolus administration of intravenous contrast.  CONTRAST:  22mL OMNIPAQUE IOHEXOL 300 MG/ML  SOLN  COMPARISON:  02/28/2010  FINDINGS: Chronic mucosal thickening in the right maxillary sinus with evidence of prior antrostomy and approximately 9 mm of left-to-right nasal septal deviation.  Along the inferior margin of the left parotid gland, a 0.9 by 1.4 cm focus of probable nodal tissue is observed.  Asymmetric enhancement is present around and especially just below the left external auditory canal compared to the right, correlate clinically in assessing for otitis externa. Adjacent mastoid air cells do not appear opacified and I do not see any middle ear fluid.  Small submandibular lymph nodes are observed. Left station 2 lymph node 0.9 cm in short axis. Anterior mediastinal tissue on image 124 series 3 is partially included on today's exam and nonspecific but probably due to thymic tissue.  Parapharyngeal spaces normal and symmetric. Thyroid gland unremarkable.  Epiglottis and aryepiglottic folds normal. No significant prevertebral soft tissue swelling. No glottic abnormality observed.  Lung apices unremarkable.  IMPRESSION: 1. Abnormal enhancement surrounding the left external auditory canal compared to the right, query early otitis externa. Correlate with physical exam findings and tenderness in this vicinity. 2. Chronic mucosal thickening in the right maxillary sinus with evidence of prior right maxillary antrostomy. Right word nasal septal deviation. 3. Upper normal size left station 2 lymph node. Probable small lymph nodes in both parotid glands, or notable inferiorly on the left. 4. No findings of  epiglottitis or colonic abnormality. The airway does not appear threatened.   Electronically Signed   By: Herbie Baltimore M.D.   On: 05/21/2014 14:32     EKG Interpretation None      MDM   Final diagnoses:  Panic attack    48 year old female here with oral swelling. She had a panic attack will testify in court and stated she is having oral swelling and difficulty talking. She has no history of jaw dislocations. She is satting well on room air is mildly tachycardic. On exam, she is making gurgling noises and is drooling, but is leaning back relaxing. She is not short of breath. She will talk to you but is mumbling. She states her tongue feels like it is swollen. I do not appreciate much tongue swelling. Her drop is located, she is holding her mouth mildly open, but can open and close okay. She has no evidence on my exam of TMJ dislocation. Her uvula is not swollen. I cannot see epiglottitis swollen above her tongue. Her lungs are clear. I think this could possibly be related to anxiety. I will scan her neck however  to look for possible epiglottitis or other organic sources. CT scan negative for epiglottitis. Airway intact. On re-exam, sleeping comfortably, symptoms resolved. Stable for discharge.     Dagmar Hait, MD 05/21/14 [phone]

## 2014-05-21 NOTE — Discharge Instructions (Signed)
Panic Attacks  Panic attacks are sudden, short-lived surges of severe anxiety, fear, or discomfort. They may occur for no reason when you are relaxed, when you are anxious, or when you are sleeping. Panic attacks may occur for a number of reasons:   · Healthy people occasionally have panic attacks in extreme, life-threatening situations, such as war or natural disasters. Normal anxiety is a protective mechanism of the body that helps us react to danger (fight or flight response).  · Panic attacks are often seen with anxiety disorders, such as panic disorder, social anxiety disorder, generalized anxiety disorder, and phobias. Anxiety disorders cause excessive or uncontrollable anxiety. They may interfere with your relationships or other life activities.  · Panic attacks are sometimes seen with other mental illnesses such as depression and posttraumatic stress disorder.  · Certain medical conditions, prescription medicines, and drugs of abuse can cause panic attacks.  SYMPTOMS   Panic attacks start suddenly, peak within 20 minutes, and are accompanied by four or more of the following symptoms:  · Pounding heart or fast heart rate (palpitations).  · Sweating.  · Trembling or shaking.  · Shortness of breath or feeling smothered.  · Feeling choked.  · Chest pain or discomfort.  · Nausea or strange feeling in your stomach.  · Dizziness, lightheadedness, or feeling like you will faint.  · Chills or hot flushes.  · Numbness or tingling in your lips or hands and feet.  · Feeling that things are not real or feeling that you are not yourself.  · Fear of losing control or going crazy.  · Fear of dying.  Some of these symptoms can mimic serious medical conditions. For example, you may think you are having a heart attack. Although panic attacks can be very scary, they are not life threatening.  DIAGNOSIS   Panic attacks are diagnosed through an assessment by your health care provider. Your health care provider will ask questions  about your symptoms, such as where and when they occurred. Your health care provider will also ask about your medical history and use of alcohol and drugs, including prescription medicines. Your health care provider may order blood tests or other studies to rule out a serious medical condition. Your health care provider may refer you to a mental health professional for further evaluation.  TREATMENT   · Most healthy people who have one or two panic attacks in an extreme, life-threatening situation will not require treatment.  · The treatment for panic attacks associated with anxiety disorders or other mental illness typically involves counseling with a mental health professional, medicine, or a combination of both. Your health care provider will help determine what treatment is best for you.  · Panic attacks due to physical illness usually goes away with treatment of the illness. If prescription medicine is causing panic attacks, talk with your health care provider about stopping the medicine, decreasing the dose, or substituting another medicine.  · Panic attacks due to alcohol or drug abuse goes away with abstinence. Some adults need professional help in order to stop drinking or using drugs.  HOME CARE INSTRUCTIONS   · Take all your medicines as prescribed.    · Check with your health care provider before starting new prescription or over-the-counter medicines.  · Keep all follow up appointments with your health care provider.  SEEK MEDICAL CARE IF:  · You are not able to take your medicines as prescribed.  · Your symptoms do not improve or get worse.  SEEK IMMEDIATE   MEDICAL CARE IF:   · You experience panic attack symptoms that are different than your usual symptoms.  · You have serious thoughts about hurting yourself or others.  · You are taking medicine for panic attacks and have a serious side effect.  MAKE SURE YOU:  · Understand these instructions.  · Will watch your condition.  · Will get help right away  if you are not doing well or get worse.  Document Released: 12/13/2005 Document Revised: 10/03/2013 Document Reviewed: 07/27/2013  [employer]® Patient Information ©2014 [employer].

## 2014-05-27 ENCOUNTER — Encounter (HOSPITAL_COMMUNITY): Payer: Self-pay | Admitting: Emergency Medicine

## 2014-05-27 ENCOUNTER — Emergency Department (HOSPITAL_COMMUNITY)
Admission: EM | Admit: 2014-05-27 | Discharge: 2014-05-27 | Disposition: A | Discharge status: Home / Self Care | Payer: Medicaid Other | Attending: Emergency Medicine | Admitting: Emergency Medicine

## 2014-05-27 DIAGNOSIS — Z79899 Other long term (current) drug therapy: Secondary | ICD-10-CM | POA: Diagnosis not present

## 2014-05-27 DIAGNOSIS — R51 Headache: Secondary | ICD-10-CM | POA: Insufficient documentation

## 2014-05-27 DIAGNOSIS — F419 Anxiety disorder, unspecified: Secondary | ICD-10-CM

## 2014-05-27 DIAGNOSIS — G8929 Other chronic pain: Secondary | ICD-10-CM | POA: Insufficient documentation

## 2014-05-27 DIAGNOSIS — R5381 Other malaise: Secondary | ICD-10-CM | POA: Diagnosis not present

## 2014-05-27 DIAGNOSIS — F411 Generalized anxiety disorder: Secondary | ICD-10-CM | POA: Insufficient documentation

## 2014-05-27 DIAGNOSIS — R5383 Other fatigue: Secondary | ICD-10-CM

## 2014-05-27 DIAGNOSIS — R110 Nausea: Secondary | ICD-10-CM | POA: Insufficient documentation

## 2014-05-27 DIAGNOSIS — T887XXA Unspecified adverse effect of drug or medicament, initial encounter: Secondary | ICD-10-CM

## 2014-05-27 DIAGNOSIS — Z8679 Personal history of other diseases of the circulatory system: Secondary | ICD-10-CM | POA: Diagnosis not present

## 2014-05-27 DIAGNOSIS — R29810 Facial weakness: Secondary | ICD-10-CM | POA: Diagnosis present

## 2014-05-27 DIAGNOSIS — I252 Old myocardial infarction: Secondary | ICD-10-CM | POA: Insufficient documentation

## 2014-05-27 DIAGNOSIS — T424X5A Adverse effect of benzodiazepines, initial encounter: Secondary | ICD-10-CM | POA: Diagnosis not present

## 2014-05-27 DIAGNOSIS — Z87891 Personal history of nicotine dependence: Secondary | ICD-10-CM | POA: Diagnosis not present

## 2014-05-27 HISTORY — DX: Dorsalgia, unspecified: M54.9

## 2014-05-27 HISTORY — DX: Other chronic pain: G89.29

## 2014-05-27 HISTORY — DX: Acute myocardial infarction, unspecified: I21.9

## 2014-05-27 MED ORDER — ONDANSETRON 4 MG PO TBDP: 4.0000 mg | ORAL_TABLET | Freq: Three times a day (TID) | ORAL | Status: DC | PRN

## 2014-05-27 MED ORDER — ACETAMINOPHEN 500 MG PO TABS
1000.0000 mg | ORAL_TABLET | Freq: Once | ORAL | Status: AC
  Administered 2014-05-27: 1000 mg via ORAL
  Filled 2014-05-27: qty 2

## 2014-05-27 NOTE — ED Notes (Signed)
Pt brought by [facility] from [facility] for stroke-like s/s (headache, L facial droop and bil LE weakness).  Pt was tx for same at [facility] on Thursday and d/c for anxiety.  Today pt was given 4 mg IV valium by EMS and all s/s resolved, though pt continues to c/o headache.  AO x 4.  Pt states her father recently died and she has began experiencing stroke-like symptoms since then.  VS stable per EMS.

## 2014-05-27 NOTE — Discharge Instructions (Signed)
Nausea, Adult Nausea is the feeling that you have an upset stomach or have to vomit. Nausea by itself is not likely a serious concern, but it may be an early sign of more serious medical problems. As nausea gets worse, it can lead to vomiting. If vomiting develops, there is the risk of dehydration.  CAUSES   Viral infections.  Food poisoning.  Medicines.  Pregnancy.  Motion sickness.  Migraine headaches.  Emotional distress.  Severe pain from any source.  Alcohol intoxication. HOME CARE INSTRUCTIONS  Get plenty of rest.  Ask your caregiver about specific rehydration instructions.  Eat small amounts of food and sip liquids more often.  Take all medicines as told by your caregiver. SEEK MEDICAL CARE IF:  You have not improved after 2 days, or you get worse.  You have a headache. SEEK IMMEDIATE MEDICAL CARE IF:   You have a fever.  You faint.  You keep vomiting or have blood in your vomit.  You are extremely weak or dehydrated.  You have dark or bloody stools.  You have severe chest or abdominal pain. MAKE SURE YOU:  Understand these instructions.  Will watch your condition.  Will get help right away if you are not doing well or get worse. Document Released: 01/20/2005 Document Revised: 09/06/2012 Document Reviewed: 08/25/2011 [facility] Patient Information 2014 [place], [place]. Panic Attacks Panic attacks are sudden, short-livedsurges of severe anxiety, fear, or discomfort. They may occur for no reason when you are relaxed, when you are anxious, or when you are sleeping. Panic attacks may occur for a number of reasons:   Healthy people occasionally have panic attacks in extreme, life-threatening situations, such as war or natural disasters. Normal anxiety is a protective mechanism of the body that helps [place] react to danger (fight or flight response).  Panic attacks are often seen with anxiety disorders, such as panic disorder, social anxiety disorder,  generalized anxiety disorder, and phobias. Anxiety disorders cause excessive or uncontrollable anxiety. They may interfere with your relationships or other life activities.  Panic attacks are sometimes seen with other mental illnesses such as depression and posttraumatic stress disorder.  Certain medical conditions, prescription medicines, and drugs of abuse can cause panic attacks. SYMPTOMS  Panic attacks start suddenly, peak within 20 minutes, and are accompanied by four or more of the following symptoms:  Pounding heart or fast heart rate (palpitations).  Sweating.  Trembling or shaking.  Shortness of breath or feeling smothered.  Feeling choked.  Chest pain or discomfort.  Nausea or strange feeling in your stomach.  Dizziness, lightheadedness, or feeling like you will faint.  Chills or hot flushes.  Numbness or tingling in your lips or hands and feet.  Feeling that things are not real or feeling that you are not yourself.  Fear of losing control or going crazy.  Fear of dying. Some of these symptoms can mimic serious medical conditions. For example, you may think you are having a heart attack. Although panic attacks can be very scary, they are not life threatening. DIAGNOSIS  Panic attacks are diagnosed through an assessment by your health care provider. Your health care provider will ask questions about your symptoms, such as where and when they occurred. Your health care provider will also ask about your medical history and use of alcohol and drugs, including prescription medicines. Your health care provider may order blood tests or other studies to rule out a serious medical condition. Your health care provider may refer you to a mental health  professional for further evaluation. TREATMENT   Most healthy people who have one or two panic attacks in an extreme, life-threatening situation will not require treatment.  The treatment for panic attacks associated with anxiety  disorders or other mental illness typically involves counseling with a mental health professional, medicine, or a combination of both. Your health care provider will help determine what treatment is best for you.  Panic attacks due to physical illness usually goes away with treatment of the illness. If prescription medicine is causing panic attacks, talk with your health care provider about stopping the medicine, decreasing the dose, or substituting another medicine.  Panic attacks due to alcohol or drug abuse goes away with abstinence. Some adults need professional help in order to stop drinking or using drugs. HOME CARE INSTRUCTIONS   Take all your medicines as prescribed.   Check with your health care provider before starting new prescription or over-the-counter medicines.  Keep all follow up appointments with your health care provider. SEEK MEDICAL CARE IF:  You are not able to take your medicines as prescribed.  Your symptoms do not improve or get worse. SEEK IMMEDIATE MEDICAL CARE IF:   You experience panic attack symptoms that are different than your usual symptoms.  You have serious thoughts about hurting yourself or others.  You are taking medicine for panic attacks and have a serious side effect. MAKE SURE YOU:  Understand these instructions.  Will watch your condition.  Will get help right away if you are not doing well or get worse. Document Released: 12/13/2005 Document Revised: 10/03/2013 Document Reviewed: 07/27/2013 [facility] Patient Information 2014 [place].

## 2014-05-27 NOTE — ED Provider Notes (Signed)
CSN: [id number]     Arrival date & time 05/27/14  1343 History   First MD Initiated Contact with Patient 05/27/14 1350     Chief Complaint  Patient presents with  . Facial Droop  . Anxiety     (Consider location/radiation/quality/duration/timing/severity/associated sxs/prior Treatment) Patient is a 48 y.o. female presenting with anxiety. The history is provided by the patient.  Anxiety This is a recurrent problem. The current episode started today. The problem occurs constantly. The problem has been resolved. Associated symptoms include headaches (Recurrent nearly lifelong with typical symptoms for her), nausea (Related to administration of valium) and weakness (and inability to speak to to feeling like her tongue and mouth are heavy). Pertinent negatives include no anorexia or fever. Nothing aggravates the symptoms. Treatments tried: Valium and Zofran. The treatment provided significant relief.    Past Medical History  Diagnosis Date  . Tobacco use disorder   . Generalized anxiety disorder   . Panic disorder without agoraphobia   . Abnormal maternal glucose tolerance, complicating pregnancy, childbirth, or the puerperium, unspecified as to episode of care   . Migraine     since age 53  . Acute MI 2004  . Chronic back pain    Past Surgical History  Procedure Laterality Date  . Rhinoplasty    . Bilateral salpingoophorectomy  12/11    by DrBernardo  . Abdominal hysterectomy  2009  . Cholecystectomy    . Tonsillectomy    . Adenoidectomy    . Cesarean section  2004, 2006   Family History  Problem Relation Age of Onset  . Coronary artery disease Father   . Asthma Mother    History  Substance Use Topics  . Smoking status: Former Smoker    Quit date: 08/27/2009  . Smokeless tobacco: Never Used  . Alcohol Use: No   OB History   Grav Para Term Preterm Abortions TAB SAB Ect Mult Living                 Review of Systems  Constitutional: Negative for fever.   Gastrointestinal: Positive for nausea (Related to administration of valium). Negative for anorexia.  Neurological: Positive for weakness (and inability to speak to to feeling like her tongue and mouth are heavy) and headaches (Recurrent nearly lifelong with typical symptoms for her).  All other systems reviewed and are negative.     Allergies  Imitrex; Relpax; and Topamax  Home Medications   Prior to Admission medications   Medication Sig Start Date End Date Taking? Authorizing Provider  ALPRAZolam Prudy Feeler) 1 MG tablet Take 1 mg by mouth 4 (four) times daily as needed for anxiety.    Historical Provider, MD  carisoprodol (SOMA) 350 MG tablet Take 350 mg by mouth 4 (four) times daily as needed for muscle spasms.    Historical Provider, MD  Multiple Vitamin (MULTIVITAMIN WITH MINERALS) TABS tablet Take 1 tablet by mouth daily.    Historical Provider, MD  oxycodone (OXY-IR) 5 MG capsule Take 5 mg by mouth every 4 (four) hours as needed for pain.    Historical Provider, MD   BP 124/84  Pulse 83  Temp(Src) 98.5 F (36.9 C) (Oral)  Resp 12  SpO2 99% Physical Exam  Constitutional: She is oriented to person, place, and time. She appears well-developed and well-nourished. No distress.  HENT:  Head: Normocephalic.  Eyes: Conjunctivae are normal.  Neck: Neck supple. No tracheal deviation present.  Cardiovascular: Normal rate and regular rhythm.   Pulmonary/Chest: Effort normal.  No respiratory distress.  Abdominal: Soft. She exhibits no distension.  Neurological: She is alert and oriented to person, place, and time. She has normal strength. No cranial nerve deficit. Coordination and gait normal.  Skin: Skin is warm and dry.  Psychiatric: She has a normal mood and affect. Her speech is normal and behavior is normal. Cognition and memory are normal.    ED Course  Procedures (including critical care time) Labs Review Labs Reviewed - No data to display  Imaging Review No results  found.   EKG Interpretation   Date/Time:  Monday May 27 2014 14:11:33 EDT Ventricular Rate:  78 PR Interval:  138 QRS Duration: 73 QT Interval:  394 QTC Calculation: 449 R Axis:   33 Text Interpretation:  Sinus rhythm has not changed Confirmed by HORTON   MD, Toni AmendOURTNEY ([id number]) on 05/27/2014 2:19:25 PM      MDM   Final diagnoses:  Anxiety  Medication side effect   48 y.o. female presents with exacerbation of her underlying anxiety problems that she has been dealing with since the death of her father recently may be more frequent. She states that she had symptoms today she couldn't move her mouth and began to panic.  She had recently been prescribed Xanax to her primary psychiatrist because she has been unwilling to take her Valium but she was prior prescribed as it made her nauseated. EMS gave her Valium in route with Zofran and these medications allowed her to calm down and her symptoms are resolved on arrival. She has no neurologic deficits and at this time I do not believe this is due to a neurologic problem but rather anxiety.  The patient was provided Zofran for home so that she can tolerate the Valium that she has until her prescription for Xanax becomes available 5 days from now. Her psychiatrist has apparently delayed the prescription for Xanax to help her wean off of the Valium first. The patient was in agreement with this plan and appeared comfortable, ambulated out of the emergency department unassisted.   Lyndal Pulleyaniel Vanita Cannell, MD 05/27/14 [phone]

## 2014-05-28 NOTE — ED Provider Notes (Signed)
I saw and evaluated the patient, reviewed the resident's note and I agree with the findings and plan.   EKG Interpretation   Date/Time:  Monday May 27 2014 14:11:33 EDT Ventricular Rate:  78 PR Interval:  138 QRS Duration: 73 QT Interval:  394 QTC Calculation: 449 R Axis:   33 Text Interpretation:  Sinus rhythm has not changed Confirmed by Latasia Silberstein   MD, Toni Amend ([id number]) on 05/27/2014 2:19:25 PM      Patient with recent more frequent anxiety attacks presents with "intense panic attack." Patient states that she felt like she couldn't move her mouth and felt very anxious. She was recently prescribed Xanax for her anxiety but is unable to take it until Saturday when she finishes her Valium. Patient states that she had "an intense anxiety attack" where she felt nauseated and felt like she had difficulty speaking. She was given Valium and Zofran in route with complete resolution of her symptoms. She is nonfocal on exam and back to her baseline. Patient reports that she gets nauseous when she takes her Valium and that is why she is being switched to Xanax.  With a normal neurologic exam and recent history of similar symptoms, feel patient's symptoms likely secondary to panic attack. We will provide Zofran to take with her benzodiazepine.  After history, exam, and medical workup I feel the patient has been appropriately medically screened and is safe for discharge home. Pertinent diagnoses were discussed with the patient. Patient was given return precautions.   Shon Baton, MD 05/28/14 [phone]

## 2014-08-30 ENCOUNTER — Encounter (HOSPITAL_COMMUNITY): Payer: Self-pay | Admitting: Family Medicine

## 2014-08-30 ENCOUNTER — Emergency Department (HOSPITAL_COMMUNITY)
Admission: EM | Admit: 2014-08-30 | Discharge: 2014-08-30 | Disposition: A | Discharge status: Home / Self Care | Payer: Medicaid Other | Source: Home / Self Care | Attending: Family Medicine | Admitting: Family Medicine

## 2014-08-30 DIAGNOSIS — B009 Herpesviral infection, unspecified: Secondary | ICD-10-CM

## 2014-08-30 DIAGNOSIS — B001 Herpesviral vesicular dermatitis: Secondary | ICD-10-CM

## 2014-08-30 DIAGNOSIS — M79609 Pain in unspecified limb: Secondary | ICD-10-CM

## 2014-08-30 DIAGNOSIS — M79601 Pain in right arm: Secondary | ICD-10-CM

## 2014-08-30 DIAGNOSIS — G43119 Migraine with aura, intractable, without status migrainosus: Secondary | ICD-10-CM

## 2014-08-30 MED ORDER — HYDROCODONE-ACETAMINOPHEN 5-325 MG PO TABS
1.0000 | ORAL_TABLET | Freq: Once | ORAL | Status: AC
  Administered 2014-08-30: 1 via ORAL

## 2014-08-30 MED ORDER — ONDANSETRON HCL 4 MG/2ML IJ SOLN
4.0000 mg | Freq: Once | INTRAMUSCULAR | Status: AC
  Administered 2014-08-30: 4 mg via INTRAMUSCULAR

## 2014-08-30 MED ORDER — DEXAMETHASONE SODIUM PHOSPHATE 10 MG/ML IJ SOLN
10.0000 mg | Freq: Once | INTRAMUSCULAR | Status: AC
  Administered 2014-08-30: 10 mg via INTRAMUSCULAR

## 2014-08-30 MED ORDER — VALACYCLOVIR HCL 1 G PO TABS: 2000.0000 mg | ORAL_TABLET | Freq: Two times a day (BID) | ORAL | Status: DC

## 2014-08-30 MED ORDER — ONDANSETRON 4 MG PO TBDP: 4.0000 mg | ORAL_TABLET | Freq: Three times a day (TID) | ORAL | Status: DC | PRN

## 2014-08-30 MED ORDER — ONDANSETRON HCL 4 MG/2ML IJ SOLN
INTRAMUSCULAR | Status: AC
  Filled 2014-08-30: qty 2

## 2014-08-30 MED ORDER — DEXAMETHASONE SODIUM PHOSPHATE 10 MG/ML IJ SOLN
INTRAMUSCULAR | Status: AC
  Filled 2014-08-30: qty 1

## 2014-08-30 MED ORDER — HYDROCODONE-ACETAMINOPHEN 5-325 MG PO TABS
ORAL_TABLET | ORAL | Status: AC
  Filled 2014-08-30: qty 1

## 2014-08-30 NOTE — Discharge Instructions (Signed)
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 This needs valtrex in order to clear up the infection The medications given you in the clinic (norco, Decadron, and zofran) should relieve the headache Your right arm pain is likely what is called neuropathic pain. THe steroid shot today should help You may need to start a nerve pain pill such as neurontin for this. Please discuss this with your primary care doctor.  There is no evidence of fracture or other injury to your arm

## 2014-08-30 NOTE — ED Notes (Signed)
Mother is driving

## 2014-08-30 NOTE — ED Notes (Signed)
See physicians note   

## 2014-08-30 NOTE — ED Provider Notes (Signed)
CSN: [id number]     Arrival date & time 08/30/14  1629 History   First MD Initiated Contact with Patient 08/30/14 1648     Chief Complaint  Patient presents with  . Mouth Lesions   (Consider location/radiation/quality/duration/timing/severity/associated sxs/prior Treatment) HPI  [place]res: [place]. Recurring cold sores. Tried several OTC medications w/ improvement. As one clears up another comes on. Painful. Denies fevers, facial numbness or weakness, HA. Cold sores typically triggered by stomach illnesses.   R arm pain: started 2 mo ago. Mainly around the wrist. Shooting pain. Pains travel proximally w/ certain movements. Worse w/ certain movement   Migraine: started 7 days ago. Comes and goes. Allergic to triptans. Chronic h/o migraines. Improves w/ percocet and sleep. (one percocet and sleep will completely take the headache away). No pain medications for 4 mo. Stress is likely the trigger. Associated w/ R sided throbbing pain and photophobia and nausea.    Past Medical History  Diagnosis Date  . Tobacco use disorder   . Generalized anxiety disorder   . Panic disorder without agoraphobia   . Abnormal maternal glucose tolerance, complicating pregnancy, childbirth, or the puerperium, unspecified as to episode of care   . Migraine     since age 43  . Acute MI 2004  . Chronic back pain    Past Surgical History  Procedure Laterality Date  . Rhinoplasty    . Bilateral salpingoophorectomy  12/11    by DrBernardo  . Abdominal hysterectomy  2009  . Cholecystectomy    . Tonsillectomy    . Adenoidectomy    . Cesarean section  2004, 2006   Family History  Problem Relation Age of Onset  . Coronary artery disease Father   . Asthma Mother    History  Substance Use Topics  . Smoking status: Former Smoker    Quit date: 08/27/2009  . Smokeless tobacco: Never Used  . Alcohol Use: No   OB History   Grav Para Term Preterm Abortions TAB SAB Ect Mult  Living                 Review of Systems Per HPI with all other pertinent systems negative.   Allergies  Imitrex; Relpax; and Topamax  Home Medications   Prior to Admission medications   Medication Sig Start Date End Date Taking? Authorizing Provider  ALPRAZolam Prudy Feeler) 1 MG tablet Take 1 mg by mouth 4 (four) times daily as needed for anxiety.    Historical Provider, MD  carisoprodol (SOMA) 350 MG tablet Take 350 mg by mouth 4 (four) times daily as needed for muscle spasms.    Historical Provider, MD  Multiple Vitamin (MULTIVITAMIN WITH MINERALS) TABS tablet Take 1 tablet by mouth daily.    Historical Provider, MD  ondansetron (ZOFRAN ODT) 4 MG disintegrating tablet Take 1 tablet (4 mg total) by mouth every 8 (eight) hours as needed for nausea or vomiting. 05/27/14   Lyndal Pulley, MD  ondansetron (ZOFRAN-ODT) 4 MG disintegrating tablet Take 1 tablet (4 mg total) by mouth every 8 (eight) hours as needed for nausea or vomiting. 08/30/14   Ozella Rocks, MD  oxycodone (OXY-IR) 5 MG capsule Take 5 mg by mouth every 4 (four) hours as needed for pain.    Historical Provider, MD  valACYclovir (VALTREX) 1000 MG tablet Take 2 tablets (2,000 mg total) by mouth 2 (two) times daily. 08/30/14   Ozella Rocks, MD   BP 109/75  Pulse 86  Temp(Src) 97.6 F (36.4 C)  Resp 12  SpO2 100% Physical Exam  Constitutional: She is oriented to person, place, and time. She appears well-developed and well-nourished. No distress.  HENT:  2 ulcerated lower lip lesions (typical herpetic looking lesions) ttp  Eyes: EOM are normal. Pupils are equal, round, and reactive to light.  Neck: Normal range of motion.  Cardiovascular: Normal rate and regular rhythm.   Pulmonary/Chest: Effort normal and breath sounds normal.  Abdominal: Soft. She exhibits no distension.  Musculoskeletal: Normal range of motion. She exhibits no edema.  R arm nml in appearance. Arm and wrist nml ROM. Intermittent ttp and inconsistent  presentation when distracted.   Neurological: She is alert and oriented to person, place, and time. No cranial nerve deficit. She exhibits normal muscle tone. Coordination normal.  Skin: Skin is dry. No rash noted. She is not diaphoretic. No erythema. No pallor.  Psychiatric: She has a normal mood and affect.    ED Course  Procedures (including critical care time) Labs Review Labs Reviewed - No data to display  Imaging Review No results found.   MDM   1. Recurrent cold sores   2. Pain of right upper extremity   3. Intractable migraine with aura without status migrainosus    Valtrex 2gm BID x 7 days for cold sores  R arm pain. Psychosomatic component vs neuropathic type pain. Likely to improve w/ therapies given for migraines today. F/u PCP. No need for futher workup at this time  Migraine: typical presentation for pt. Out of many of her typical treatment medications.  Decadron  IM zofran  IM Norco 5/325 in office  Rx for Zofran given  Precautions given and all questions answered  Shelly Flatten, MD Family Medicine 08/30/2014, 5:21 PM      Ozella Rocks, MD 08/30/14 [phone]

## 2014-09-25 ENCOUNTER — Telehealth: Payer: Self-pay | Admitting: Cardiology

## 2014-09-25 NOTE — Telephone Encounter (Signed)
LMTCB to discuss biofreeze questions.

## 2014-09-25 NOTE — Telephone Encounter (Signed)
New message      Pt has a question about using biofreeze.

## 2014-09-26 NOTE — Telephone Encounter (Signed)
Tried calling pt back and mail box full

## 2014-09-27 ENCOUNTER — Telehealth: Payer: Self-pay | Admitting: *Deleted

## 2014-09-27 NOTE — Telephone Encounter (Signed)
Pt calling to ask if her 48 year old mother who is a heart pt and has multiple stents can use biofreeze safely for arthritis.  Informed the pt that Dr Delton SeeNelson or myself would not be able to advise on her Mother using biofreeze because she is not our pt and is under care of another cardiologist, outside our office. Advised the Daughter to contact her mother's primary cardiologist to further advise.  Pt verbalized understanding.

## 2014-09-27 NOTE — Telephone Encounter (Signed)
Ms [name] called to see if we [facility]vd [facility]

## 2014-10-06 ENCOUNTER — Emergency Department (HOSPITAL_COMMUNITY)
Admission: EM | Admit: 2014-10-06 | Discharge: 2014-10-06 | Disposition: A | Discharge status: Home / Self Care | Payer: Medicaid Other | Source: Home / Self Care | Attending: Emergency Medicine | Admitting: Emergency Medicine

## 2014-10-06 ENCOUNTER — Encounter (HOSPITAL_COMMUNITY): Payer: Self-pay | Admitting: Emergency Medicine

## 2014-10-06 DIAGNOSIS — L0291 Cutaneous abscess, unspecified: Secondary | ICD-10-CM

## 2014-10-06 MED ORDER — HYDROCODONE-ACETAMINOPHEN 5-325 MG PO TABS: ORAL_TABLET | ORAL | Status: DC

## 2014-10-06 MED ORDER — LIDOCAINE-EPINEPHRINE (PF) 2 %-1:200000 IJ SOLN
INTRAMUSCULAR | Status: AC
  Filled 2014-10-06: qty 20

## 2014-10-06 MED ORDER — SULFAMETHOXAZOLE-TMP DS 800-160 MG PO TABS: 2.0000 | ORAL_TABLET | Freq: Two times a day (BID) | ORAL | Status: DC

## 2014-10-06 NOTE — ED Provider Notes (Signed)
  Chief Complaint   Skin Problem   History of Present Illness   [name] MoodOrrell is a 48 year old female who had dental work done upper lower teeth 3 weeks ago. 5 days ago she developed a small pimple on her right chin. The screw in size, and out swollen very tender to touch. She's also had a migraine type headache with this and a low-grade fever of 99.5 with chills. She denies any intraoral swelling, painful teeth, sore throat, stiff neck, swelling of her eyelids, redness of the eyes, drainage, or blurry vision.  Review of Systems   Other than as noted above, the patient denies any of the following symptoms: Systemic:  No fever, chills or sweats. Skin:  No rash or itching.  PMFSH   Past medical history, family history, social history, meds, and allergies were reviewed.   Physical Examination     Vital signs:  BP 133/83  Pulse 80  Temp(Src) 100.3 F (37.9 C) (Oral)  Resp 16  SpO2 100% Eyes: No swelling of the lids, no erythema, or injection, PERRL, full EOMs. ENT: No intraoral lesions, no swelling of the gingiva or floor the mouth. Neck: Supple with no adenopathy. Skin:  There was a small pustule on the right lower lip, with induration and fluctuance extending posteriorly from that. The pustule measured about 5 mm, the induration measured 1.5 cm.  There was no crepitus, necrosis, ecchymosis, or herrhagic bullae. Skin exam was otherwise normal.  No rash. Ext:  Distal pulses were full, patient has full ROM of all joints.  Procedure Note:  Verbal informed consent was obtained.  The patient was informed of the risks and benefits of the procedure and understands and accepts.  A time out was called and the identity of the patient and correct procedure was confirmed.   The abscess area described above was prepped with Betadine and alcohol and anesthetized with 3 mL of 2% Xylocaine with epinephrine.  Using a #11 scalpel blade, a singe straight incision was made into the area of fluctulence,  yielding a small amount of prurulent drainage.  Routine cultures were obtained.  Blunt dissection was used to break up loculations and the resulting wound cavity was packed with 1/4 inch Iodoform gauze.  A sterile pressure dressing was applied.   Assessment   The encounter diagnosis was Abscess.  Plan     1.  Meds:  The following meds were prescribed:   Discharge Medication List as of 10/06/2014  3:23 PM    START taking these medications   Details  HYDROcodone-acetaminophen (NORCO/VICODIN) 5-325 MG per tablet 1 to 2 tabs every 4 to 6 hours as needed for pain., Print    sulfamethoxazole-trimethoprim (BACTRIM DS) 800-160 MG per tablet Take 2 tablets by mouth 2 (two) times daily., Starting 10/06/2014, Until Discontinued, Normal        2.  Patient Education/Counseling:  The patient was given appropriate handouts, wound care instructions, and instructed in pain control.  The patient was given strict return precautions if her fever should continue to go up or if she develops any progressive swelling of the face to return immediately to the emergency room.  3.  Follow up:  The patient was instructed to leave the dressing in place and return here again in 48 hours for packing removal, or sooner if becoming worse in any way, and given some red flag symptoms such as fever which would prompt immediate return.       Reuben Likesavid C Lagena Strand, MD 10/06/14 [phone]

## 2014-10-06 NOTE — ED Notes (Signed)
Patient c/o pain facial area since 10-9; ?infected cyst?

## 2014-10-06 NOTE — Discharge Instructions (Signed)

## 2014-10-08 ENCOUNTER — Encounter (HOSPITAL_COMMUNITY): Payer: Self-pay | Admitting: Emergency Medicine

## 2014-10-08 ENCOUNTER — Telehealth (HOSPITAL_COMMUNITY): Payer: Self-pay | Admitting: Family Medicine

## 2014-10-08 ENCOUNTER — Emergency Department (INDEPENDENT_AMBULATORY_CARE_PROVIDER_SITE_OTHER)
Admission: EM | Admit: 2014-10-08 | Discharge: 2014-10-08 | Disposition: A | Discharge status: Home / Self Care | Payer: Medicaid Other | Source: Home / Self Care | Attending: Family Medicine | Admitting: Family Medicine

## 2014-10-08 DIAGNOSIS — L03211 Cellulitis of face: Secondary | ICD-10-CM

## 2014-10-08 MED ORDER — ONDANSETRON HCL 8 MG PO TABS: 8.0000 mg | ORAL_TABLET | Freq: Three times a day (TID) | ORAL | Status: DC | PRN

## 2014-10-08 MED ORDER — HYDROCODONE-ACETAMINOPHEN 5-325 MG PO TABS: ORAL_TABLET | ORAL | Status: DC

## 2014-10-08 MED ORDER — CEFTRIAXONE SODIUM 1 G IJ SOLR
INTRAMUSCULAR | Status: AC
  Filled 2014-10-08: qty 10

## 2014-10-08 MED ORDER — CEFTRIAXONE SODIUM 1 G IJ SOLR
1.0000 g | Freq: Once | INTRAMUSCULAR | Status: AC
  Administered 2014-10-08: 1 g via INTRAMUSCULAR

## 2014-10-08 MED ORDER — CEPHALEXIN 500 MG PO CAPS: 500.0000 mg | ORAL_CAPSULE | Freq: Four times a day (QID) | ORAL | Status: DC

## 2014-10-08 MED ORDER — LIDOCAINE HCL (PF) 1 % IJ SOLN
INTRAMUSCULAR | Status: AC
  Filled 2014-10-08: qty 5

## 2014-10-08 NOTE — Discharge Instructions (Signed)
Thank you for coming in today. Take keflex 4 times daily starting tomorrow. Continued bactrim Come back in 2 days for wound recheck.  Go to the emergency room if getting worse Continue warm compress  Cellulitis Cellulitis is an infection of the skin and the tissue beneath it. The infected area is usually red and tender. Cellulitis occurs most often in the arms and lower legs.  CAUSES  Cellulitis is caused by bacteria that enter the skin through cracks or cuts in the skin. The most common types of bacteria that cause cellulitis are staphylococci and streptococci. SIGNS AND SYMPTOMS   Redness and warmth.  Swelling.  Tenderness or pain.  Fever. DIAGNOSIS  Your health care provider can usually determine what is wrong based on a physical exam. Blood tests may also be done. TREATMENT  Treatment usually involves taking an antibiotic medicine. HOME CARE INSTRUCTIONS   Take your antibiotic medicine as directed by your health care provider. Finish the antibiotic even if you start to feel better.  Keep the infected arm or leg elevated to reduce swelling.  Apply a warm cloth to the affected area up to 4 times per day to relieve pain.  Take medicines only as directed by your health care provider.  Keep all follow-up visits as directed by your health care provider. SEEK MEDICAL CARE IF:   You notice red streaks coming from the infected area.  Your red area gets larger or turns dark in color.  Your bone or joint underneath the infected area becomes painful after the skin has healed.  Your infection returns in the same area or another area.  You notice a swollen bump in the infected area.  You develop new symptoms.  You have a fever. SEEK IMMEDIATE MEDICAL CARE IF:   You feel very sleepy.  You develop vomiting or diarrhea.  You have a general ill feeling (malaise) with muscle aches and pains. MAKE SURE YOU:   Understand these instructions.  Will watch your  condition.  Will get help right away if you are not doing well or get worse. Document Released: 09/22/2005 Document Revised: 04/29/2014 Document Reviewed: 02/28/2012 [facility] Patient Information 2015 [place]Exit[place], [place]. This information is not intended to replace advice given to you by your health care provider. Make sure you discuss any questions you have with your health care provider.

## 2014-10-08 NOTE — ED Provider Notes (Signed)
[name] is a 48 y.o. female who presents to Urgent Care today for followup abscess. Patient was seen on October 11 for a right lower lip abscess. It was incised and drained and cultured. Patient was started on empiric Bactrim. She should today for followup. She notes that she is only a tiny little bit better. She denies any fevers or chills.   Past Medical History  Diagnosis Date  . Tobacco use disorder   . Generalized anxiety disorder   . Panic disorder without agoraphobia   . Abnormal maternal glucose tolerance, complicating pregnancy, childbirth, or the puerperium, unspecified as to episode of care   . Migraine     since age 89  . Acute MI 2004  . Chronic back pain    History  Substance Use Topics  . Smoking status: Former Smoker    Quit date: 08/27/2009  . Smokeless tobacco: Never Used  . Alcohol Use: No   ROS as above Medications: No current facility-administered medications for this encounter.   Current Outpatient Prescriptions  Medication Sig Dispense Refill  . ALPRAZolam (XANAX) 1 MG tablet Take 1 mg by mouth 4 (four) times daily as needed for anxiety.      . carisoprodol (SOMA) 350 MG tablet Take 350 mg by mouth 4 (four) times daily as needed for muscle spasms.      . cephALEXin (KEFLEX) 500 MG capsule Take 1 capsule (500 mg total) by mouth 4 (four) times daily.  28 capsule  0  . HYDROcodone-acetaminophen (NORCO/VICODIN) 5-325 MG per tablet 1 to 2 tabs every 4 to 6 hours as needed for pain.  15 tablet  0  . Multiple Vitamin (MULTIVITAMIN WITH MINERALS) TABS tablet Take 1 tablet by mouth daily.      . ondansetron (ZOFRAN ODT) 4 MG disintegrating tablet Take 1 tablet (4 mg total) by mouth every 8 (eight) hours as needed for nausea or vomiting.  10 tablet  0  . ondansetron (ZOFRAN-ODT) 4 MG disintegrating tablet Take 1 tablet (4 mg total) by mouth every 8 (eight) hours as needed for nausea or vomiting.  20 tablet  0  . oxycodone (OXY-IR) 5 MG capsule Take 5 mg by mouth  every 4 (four) hours as needed for pain.      Marland Kitchen. sulfamethoxazole-trimethoprim (BACTRIM DS) 800-160 MG per tablet Take 2 tablets by mouth 2 (two) times daily.  40 tablet  0  . valACYclovir (VALTREX) 1000 MG tablet Take 2 tablets (2,000 mg total) by mouth 2 (two) times daily.  28 tablet  0    Exam:  BP 125/86  Pulse 86  Temp(Src) 97.8 F (36.6 C) (Oral)  Resp 16  SpO2 96% Gen: Well NAD HEENT: EOMI,  MMM right lower lip swollen erythematous and indurated. Packing removed. Small amount of pus expressed. Area remains quite tender. Lungs: Normal work of breathing. CTABL Heart: RRR no MRG Abd: NABS, Soft. Nondistended, Nontender Exts: Brisk capillary refill, warm and well perfused.   No results found for this or any previous visit (from the past 24 hour(s)). No results found.  Assessment and Plan: 48 y.o. female with right lip cellulitis. Culture pending. Continue Bactrim. Start Keflex. 1 g IM ceftriaxone given today prior to discharge. Turn in 2 days for recheck  Discussed warning signs or symptoms. Please see discharge instructions. Patient expresses understanding.     Rodolph BongEvan S Akul Leggette, MD 10/08/14 [phone]

## 2014-10-08 NOTE — ED Notes (Signed)
Pt nauseated.  Zofran called in.   Rodolph BongEvan S Corey, MD 10/08/14 1520

## 2014-10-08 NOTE — ED Notes (Signed)
Here for a f/u on abscess below lip/chin; seen here on 10/11 Reports it's still painful and lower lip is swollen Taking antibiotics and toleration well Alert, no signs of acute distress.

## 2014-10-10 ENCOUNTER — Encounter (HOSPITAL_COMMUNITY): Payer: Self-pay | Admitting: Emergency Medicine

## 2014-10-10 ENCOUNTER — Emergency Department (INDEPENDENT_AMBULATORY_CARE_PROVIDER_SITE_OTHER)
Admission: EM | Admit: 2014-10-10 | Discharge: 2014-10-10 | Disposition: A | Discharge status: Home / Self Care | Payer: Medicaid Other | Source: Home / Self Care | Attending: Family Medicine | Admitting: Family Medicine

## 2014-10-10 DIAGNOSIS — Z4801 Encounter for change or removal of surgical wound dressing: Secondary | ICD-10-CM

## 2014-10-10 DIAGNOSIS — L03211 Cellulitis of face: Secondary | ICD-10-CM

## 2014-10-10 DIAGNOSIS — B001 Herpesviral vesicular dermatitis: Secondary | ICD-10-CM

## 2014-10-10 LAB — CULTURE, ROUTINE-ABSCESS

## 2014-10-10 MED ORDER — HYDROCODONE-ACETAMINOPHEN 5-325 MG PO TABS: ORAL_TABLET | ORAL | Status: DC

## 2014-10-10 MED ORDER — VALACYCLOVIR HCL 1 G PO TABS: ORAL_TABLET | ORAL | Status: DC

## 2014-10-10 NOTE — ED Notes (Signed)
Reports overall getting better.  Mild drainage.  Still having pain.  Pt states new sores inside mouth and tingling sensation of lips.

## 2014-10-10 NOTE — Discharge Instructions (Signed)
Thank you for coming in today. Continue Bactrim and Keflex antibiotics.  This should continue to get better.  Take Valtrex 2 pills twice daily today and one pill twice daily tomorrow.  Come back as needed if not getting better.  Continue warm compress.  Followup with primary care Dr..    Cellulitis Cellulitis is an infection of the skin and the tissue beneath it. The infected area is usually red and tender. Cellulitis occurs most often in the arms and lower legs.  CAUSES  Cellulitis is caused by bacteria that enter the skin through cracks or cuts in the skin. The most common types of bacteria that cause cellulitis are staphylococci and streptococci. SIGNS AND SYMPTOMS   Redness and warmth.  Swelling.  Tenderness or pain.  Fever. DIAGNOSIS  Your health care provider can usually determine what is wrong based on a physical exam. Blood tests may also be done. TREATMENT  Treatment usually involves taking an antibiotic medicine. HOME CARE INSTRUCTIONS   Take your antibiotic medicine as directed by your health care provider. Finish the antibiotic even if you start to feel better.  Keep the infected arm or leg elevated to reduce swelling.  Apply a warm cloth to the affected area up to 4 times per day to relieve pain.  Take medicines only as directed by your health care provider.  Keep all follow-up visits as directed by your health care provider. SEEK MEDICAL CARE IF:   You notice red streaks coming from the infected area.  Your red area gets larger or turns dark in color.  Your bone or joint underneath the infected area becomes painful after the skin has healed.  Your infection returns in the same area or another area.  You notice a swollen bump in the infected area.  You develop new symptoms.  You have a fever. SEEK IMMEDIATE MEDICAL CARE IF:   You feel very sleepy.  You develop vomiting or diarrhea.  You have a general ill feeling (malaise) with muscle aches and  pains. MAKE SURE YOU:   Understand these instructions.  Will watch your condition.  Will get help right away if you are not doing well or get worse. Document Released: 09/22/2005 Document Revised: 04/29/2014 Document Reviewed: 02/28/2012 [facility] Patient Information 2015 [place]ExitCare, [place]LL[place]. This information is not intended to replace advice given to you by your health care provider. Make sure you discuss any questions you have with your health care provider.   Herpes Labialis You have a fever blister or cold sore (herpes labialis). These painful, grouped sores are caused by one of the herpes viruses (HSV1 most commonly). They are usually found around the lips and mouth, but the same infection can also affect other areas on the face such as the nose and eyes. Herpes infections take about 10 days to heal. They often occur again and again in the same spot. Other symptoms may include numbness and tingling in the involved skin, achiness, fever, and swollen glands in the neck. Colds, emotional stress, injuries, or excess sunlight exposure all seem to make herpes reappear. Herpes lip infections are contagious. Direct contact with these sores can spread the infection. It can also be spread to other parts of your own body. TREATMENT  Herpes labialis is usually self-limited and resolves within 1 week. To reduce pain and swelling, apply ice packs frequently to the sores or suck on popsicles or frozen juice bars. Antiviral medicine may be used by mouth to shorten the duration of the breakout. Avoid spreading  the infection by washing your hands often. Be careful not to touch your eyes or genital areas after handling the infected blisters. Do not kiss or have other intimate contact with others. After the blisters are completely healed you may resume contact. Use sunscreen to lessen recurrences.  If this is your first infection with herpes, or if you have a severe or repeated infections, your caregiver may prescribe  one of the anti-viral drugs to speed up the healing. If you have sun-related flare-ups despite the use of sunscreen, starting oral anti-viral medicine before a prolonged exposure (going skiing or to the beach) can prevent most episodes.  SEEK IMMEDIATE MEDICAL CARE IF:  You develop a headache, sleepiness, high fever, vomiting, or severe weakness.  You have eye irritation, pain, blurred vision or redness.  You develop a prolonged infection not getting better in 10 days. Document Released: 12/13/2005 Document Revised: 03/06/2012 Document Reviewed: 10/17/2009 [facility] MartinezExitCare Patient Information 2015 [place]Exit[place], [place]LLC. This information is not intended to replace advice given to you by your health care provider. Make sure you discuss any questions you have with your health care provider.

## 2014-10-10 NOTE — ED Provider Notes (Signed)
[name] is a 48 y.o. female who presents to Urgent Care today for facial cellulitis. Patient is here to followup facial infection. She was seen initially on October 11 for abscess involving the right lower lip. This was incised drained and packed. Additionally a culture was obtained. She return to clinic on the 13th for followup. The packing was removed. The patient continued to have pain and induration. The culture this point was still pending. She was additionally given Keflex. She return to clinic today feeling better. The pain is still present but much less than usual. She denies any discharge. 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 Pain is moderate.   Past Medical History  Diagnosis Date  . Tobacco use disorder   . Generalized anxiety disorder   . Panic disorder without agoraphobia   . Abnormal maternal glucose tolerance, complicating pregnancy, childbirth, or the puerperium, unspecified as to episode of care   . Migraine     since age 89  . Acute MI 2004  . Chronic back pain    History  Substance Use Topics  . Smoking status: Former Smoker    Quit date: 08/27/2009  . Smokeless tobacco: Never Used  . Alcohol Use: No   ROS as above Medications: No current facility-administered medications for this encounter.   Current Outpatient Prescriptions  Medication Sig Dispense Refill  . ALPRAZolam (XANAX) 1 MG tablet Take 1 mg by mouth 4 (four) times daily as needed for anxiety.      Marland Kitchen. HYDROcodone-acetaminophen (NORCO/VICODIN) 5-325 MG per tablet 1 to 2 tabs every 4 to 6 hours as needed for pain.  15 tablet  0  . Multiple Vitamin (MULTIVITAMIN WITH MINERALS) TABS tablet Take 1 tablet by mouth daily.      Marland Kitchen. sulfamethoxazole-trimethoprim (BACTRIM DS) 800-160 MG per tablet Take 2 tablets by mouth 2 (two) times daily.  40 tablet  0  . carisoprodol (SOMA) 350 MG tablet Take 350 mg by mouth 4  (four) times daily as needed for muscle spasms.      . cephALEXin (KEFLEX) 500 MG capsule Take 1 capsule (500 mg total) by mouth 4 (four) times daily.  28 capsule  0  . ondansetron (ZOFRAN ODT) 4 MG disintegrating tablet Take 1 tablet (4 mg total) by mouth every 8 (eight) hours as needed for nausea or vomiting.  10 tablet  0  . ondansetron (ZOFRAN) 8 MG tablet Take 1 tablet (8 mg total) by mouth every 8 (eight) hours as needed for nausea or vomiting.  20 tablet  0  . ondansetron (ZOFRAN-ODT) 4 MG disintegrating tablet Take 1 tablet (4 mg total) by mouth every 8 (eight) hours as needed for nausea or vomiting.  20 tablet  0  . oxycodone (OXY-IR) 5 MG capsule Take 5 mg by mouth every 4 (four) hours as needed for pain.      . valACYclovir (VALTREX) 1000 MG tablet 2 pills po bid x 1 day and 1 pill po bid x 1 day  6 tablet  0    Exam:  BP 112/76  Pulse 78  Temp(Src) 98 F (36.7 C) (Oral)  Resp 16  SpO2 99% Gen: Well NAD HEENT: EOMI,  MMM lipid swelling is reduced as is the erythema and induration. Tenderness is still present. Patient has mild crusting on the inside surface of her right lower lip Lungs: Normal work of breathing. CTABL Heart: RRR no  MRG Abd: NABS, Soft. Nondistended, Nontender Exts: Brisk capillary refill, warm and well perfused.   Abscess culture shows MRSA sensitive to Bactrim  No results found for this or any previous visit (from the past 24 hour(s)). No results found.  Assessment and Plan: 48 y.o. female with  1) resolving facial cellulitis. Continue Bactrim and Keflex. Return as needed.  Refill Norco limited amount last time. 2) herpes labialis: Valtrex  Discussed warning signs or symptoms. Please see discharge instructions. Patient expresses understanding.     Rodolph BongEvan S Corey, MD 10/10/14 [phone]

## 2014-10-11 ENCOUNTER — Telehealth (HOSPITAL_COMMUNITY): Payer: Self-pay | Admitting: *Deleted

## 2014-10-11 NOTE — ED Notes (Addendum)
Abscess culture R jaw: Mod. MRSA. Pt. adequately treated with I and D and Bactrim DS. Pt. also got Keflex and Rocephin 1 gm IM on 10/13 and Valtrex 10/15. I called pt. and left a message to call.  Call 1. 10/11/2014 Left message. Call 2. 10/14/2014 Pt.called back. Pt. verified x 2 and given result. Pt. said it is much better- almost gone.  Pt. told she was adequately treated. [facility] and told her to notify her PCP. Pt. voiced understanding. 10/14/2014

## 2014-10-11 NOTE — Progress Notes (Signed)
Quick Note:  Results are abnormal as noted, but have been adequately treated. No further action necessary. ______ 

## 2014-11-25 ENCOUNTER — Encounter (HOSPITAL_COMMUNITY): Payer: Self-pay | Admitting: *Deleted

## 2014-11-25 ENCOUNTER — Emergency Department (HOSPITAL_COMMUNITY)
Admission: EM | Admit: 2014-11-25 | Discharge: 2014-11-25 | Disposition: A | Discharge status: Home / Self Care | Payer: Medicaid Other | Source: Home / Self Care | Attending: Emergency Medicine | Admitting: Emergency Medicine

## 2014-11-25 DIAGNOSIS — J069 Acute upper respiratory infection, unspecified: Secondary | ICD-10-CM

## 2014-11-25 MED ORDER — PREDNISONE 20 MG PO TABS: 20.0000 mg | ORAL_TABLET | Freq: Two times a day (BID) | ORAL | Status: DC

## 2014-11-25 MED ORDER — ALBUTEROL SULFATE HFA 108 (90 BASE) MCG/ACT IN AERS: 2.0000 | INHALATION_SPRAY | Freq: Four times a day (QID) | RESPIRATORY_TRACT | Status: DC

## 2014-11-25 MED ORDER — HYDROCODONE-ACETAMINOPHEN 5-325 MG PO TABS: ORAL_TABLET | ORAL | Status: DC

## 2014-11-25 MED ORDER — IPRATROPIUM BROMIDE 0.06 % NA SOLN: 2.0000 | Freq: Four times a day (QID) | NASAL | Status: DC

## 2014-11-25 NOTE — ED Notes (Signed)
3 days of slightly productive cough, fever of 99.9, nasal/sinus congestion,   She has taken nyquil and some vicodin she had from a MRSA infection 2 months ago

## 2014-11-25 NOTE — ED Provider Notes (Signed)
Chief Complaint   URI   History of Present Illness   [name] is a 48 year old female who's had a 3 day history of nonproductive cough, chest tightness, wheezing, aching in her ribs when she coughs, rhinorrhea with yellowish to clear drainage, sinus pressure, sore throat, chills, sweats, and anorexia. This history review migraine headaches. Both her sons have upper respiratory symptoms with cough. She denies any fever, vomiting, or diarrhea.  Review of Systems   Other than as noted above, the patient denies any of the following symptoms: Systemic:  No fevers, chills, sweats, or myalgias. Eye:  No redness or discharge. ENT:  No ear pain, headache, nasal congestion, drainage, sinus pressure, or sore throat. Neck:  No neck pain, stiffness, or swollen glands. Lungs:  No cough, sputum production, hemoptysis, wheezing, chest tightness, shortness of breath or chest pain. GI:  No abdominal pain, nausea, vomiting or diarrhea.  PMFSH   Past medical history, family history, social history, meds, and allergies were reviewed. Her only home medication is Xanax. She is a smoker, but has cut down since she's been sick.  Physical exam   Vital signs:  BP 125/84 mmHg  Pulse 102  Temp(Src) 98.4 F (36.9 C) (Oral)  Resp 16  SpO2 98% General:  Alert and oriented.  In no distress.  Skin warm and dry. Eye:  No conjunctival injection or drainage. Lids were normal. ENT:  TMs and canals were normal, without erythema or inflammation.  Nasal mucosa was clear and uncongested, without drainage.  Mucous membranes were moist.  Pharynx was clear with no exudate or drainage.  There were no oral ulcerations or lesions. Neck:  Supple, no adenopathy, tenderness or mass. Lungs:  No respiratory distress.  Lungs were clear to auscultation, without wheezes, rales or rhonchi.  Breath sounds were clear and equal bilaterally.  Heart:  Regular rhythm, without gallops, murmers or rubs. Skin:  Clear, warm, and dry,  without rash or lesions.   Assessment     The encounter diagnosis was Viral URI.  There is no evidence of pneumonia, strep throat, sinusitis, otitis media.    Plan    1.  Meds:  The following meds were prescribed:   Discharge Medication List as of 11/25/2014  8:55 AM    START taking these medications   Details  albuterol (PROVENTIL HFA;VENTOLIN HFA) 108 (90 BASE) MCG/ACT inhaler Inhale 2 puffs into the lungs 4 (four) times daily., Starting 11/25/2014, Until Discontinued, Normal    !! HYDROcodone-acetaminophen (NORCO/VICODIN) 5-325 MG per tablet 1 to 2 tabs every 4 to 6 hours as needed for pain., Print    ipratropium (ATROVENT) 0.06 % nasal spray Place 2 sprays into both nostrils 4 (four) times daily., Starting 11/25/2014, Until Discontinued, Normal    predniSONE (DELTASONE) 20 MG tablet Take 1 tablet (20 mg total) by mouth 2 (two) times daily., Starting 11/25/2014, Until Discontinued, Normal     !! - Potential duplicate medications found. Please discuss with provider.      2.  Patient Education/Counseling:  The patient was given appropriate handouts, self care instructions, and instructed in symptomatic relief.  Instructed to get extra fluids and extra rest.    3.  Follow up:  The patient was told to follow up here if no better in 3 to 4 days, or sooner if becoming worse in any way, and given some red flag symptoms such as increasing fever, difficulty breathing, chest pain, or persistent vomiting which would prompt immediate return.  Reuben Likesavid C Daelon Dunivan, MD 11/25/14 [phone]

## 2014-11-25 NOTE — Discharge Instructions (Signed)

## 2015-01-07 ENCOUNTER — Encounter (HOSPITAL_COMMUNITY): Payer: Self-pay | Admitting: *Deleted

## 2015-01-07 ENCOUNTER — Emergency Department (HOSPITAL_COMMUNITY)
Admission: EM | Admit: 2015-01-07 | Discharge: 2015-01-07 | Disposition: A | Discharge status: Home / Self Care | Payer: Medicaid Other | Source: Home / Self Care | Attending: Family Medicine | Admitting: Family Medicine

## 2015-01-07 DIAGNOSIS — J45901 Unspecified asthma with (acute) exacerbation: Secondary | ICD-10-CM | POA: Diagnosis not present

## 2015-01-07 MED ORDER — HYDROCODONE-ACETAMINOPHEN 7.5-500 MG/15ML PO SOLN: 7.5000 mL | Freq: Four times a day (QID) | ORAL | Status: DC | PRN

## 2015-01-07 MED ORDER — HYDROCOD POLST-CHLORPHEN POLST 10-8 MG/5ML PO LQCR: 5.0000 mL | Freq: Two times a day (BID) | ORAL | Status: DC | PRN

## 2015-01-07 MED ORDER — ALBUTEROL SULFATE (2.5 MG/3ML) 0.083% IN NEBU
5.0000 mg | INHALATION_SOLUTION | Freq: Once | RESPIRATORY_TRACT | Status: AC
  Administered 2015-01-07: 5 mg via RESPIRATORY_TRACT

## 2015-01-07 MED ORDER — IPRATROPIUM BROMIDE 0.02 % IN SOLN
0.5000 mg | Freq: Once | RESPIRATORY_TRACT | Status: AC
  Administered 2015-01-07: 0.5 mg via RESPIRATORY_TRACT

## 2015-01-07 MED ORDER — LEVOFLOXACIN 500 MG PO TABS: 500.0000 mg | ORAL_TABLET | Freq: Every day | ORAL | Status: DC

## 2015-01-07 MED ORDER — IPRATROPIUM BROMIDE 0.02 % IN SOLN
RESPIRATORY_TRACT | Status: AC
  Filled 2015-01-07: qty 2.5

## 2015-01-07 MED ORDER — ALBUTEROL SULFATE (2.5 MG/3ML) 0.083% IN NEBU
INHALATION_SOLUTION | RESPIRATORY_TRACT | Status: AC
  Filled 2015-01-07: qty 6

## 2015-01-07 NOTE — Discharge Instructions (Signed)
Take all of medicine, drink lots of fluids, no more smoking, use mucinex , see your doctor if further problems °

## 2015-01-07 NOTE — ED Notes (Signed)
Pt         Reports              Body  Aches       Cough    Congestion                Stuffy  Nose           Symptoms   X  1  Week           Symptoms

## 2015-01-07 NOTE — ED Provider Notes (Signed)
CSN: [id number]     Arrival date & time 01/07/15  0845 History   First MD Initiated Contact with Patient 01/07/15 0915     Chief Complaint  Patient presents with  . Cough   (Consider location/radiation/quality/duration/timing/severity/associated sxs/prior Treatment) Patient is a 49 y.o. female presenting with cough. The history is provided by the patient.  Cough Cough characteristics:  Productive and harsh Sputum characteristics:  Yellow Severity:  Moderate Onset quality:  Gradual Duration:  4 weeks Chronicity:  Recurrent Smoker: yes   Relieved by:  None tried Worsened by:  Nothing tried Ineffective treatments:  None tried Associated symptoms: wheezing   Associated symptoms: no chills, no fever and no shortness of breath     Past Medical History  Diagnosis Date  . Tobacco use disorder   . Generalized anxiety disorder   . Panic disorder without agoraphobia   . Abnormal maternal glucose tolerance, complicating pregnancy, childbirth, or the puerperium, unspecified as to episode of care   . Migraine     since age 89  . Acute MI 2004  . Chronic back pain    Past Surgical History  Procedure Laterality Date  . Rhinoplasty    . Bilateral salpingoophorectomy  12/11    by DrBernardo  . Abdominal hysterectomy  2009  . Cholecystectomy    . Tonsillectomy    . Adenoidectomy    . Cesarean section  2004, 2006   Family History  Problem Relation Age of Onset  . Coronary artery disease Father   . Asthma Mother    History  Substance Use Topics  . Smoking status: Current Some Day Smoker -- 0.50 packs/day    Types: Cigarettes    Last Attempt to Quit: 08/27/2009  . Smokeless tobacco: Never Used  . Alcohol Use: No   OB History    No data available     Review of Systems  Constitutional: Negative.  Negative for fever and chills.  HENT: Negative.   Respiratory: Positive for cough and wheezing. Negative for shortness of breath.     Allergies  Imitrex; Relpax; and  Topamax  Home Medications   Prior to Admission medications   Medication Sig Start Date End Date Taking? Authorizing Provider  albuterol (PROVENTIL HFA;VENTOLIN HFA) 108 (90 BASE) MCG/ACT inhaler Inhale 2 puffs into the lungs 4 (four) times daily. 11/25/14   Reuben Likesavid C Keller, MD  ALPRAZolam Prudy Feeler(XANAX) 1 MG tablet Take 1 mg by mouth 4 (four) times daily as needed for anxiety.    Historical Provider, MD  chlorpheniramine-HYDROcodone (TUSSIONEX PENNKINETIC ER) 10-8 MG/5ML LQCR Take 5 mLs by mouth every 12 (twelve) hours as needed for cough. 01/07/15   Linna HoffJames D Kindl, MD  HYDROcodone-acetaminophen (NORCO/VICODIN) 5-325 MG per tablet 1 to 2 tabs every 4 to 6 hours as needed for pain. 10/10/14   Rodolph BongEvan S Corey, MD  HYDROcodone-acetaminophen (NORCO/VICODIN) 5-325 MG per tablet 1 to 2 tabs every 4 to 6 hours as needed for pain. 11/25/14   Reuben Likesavid C Keller, MD  ipratropium (ATROVENT) 0.06 % nasal spray Place 2 sprays into both nostrils 4 (four) times daily. 11/25/14   Reuben Likesavid C Keller, MD  levofloxacin (LEVAQUIN) 500 MG tablet Take 1 tablet (500 mg total) by mouth daily. 01/07/15   Linna HoffJames D Kindl, MD  Multiple Vitamin (MULTIVITAMIN WITH MINERALS) TABS tablet Take 1 tablet by mouth daily.    Historical Provider, MD  ondansetron (ZOFRAN ODT) 4 MG disintegrating tablet Take 1 tablet (4 mg total) by mouth every 8 (eight) hours as  needed for nausea or vomiting. 05/27/14   Lyndal Pulley, MD  ondansetron (ZOFRAN) 8 MG tablet Take 1 tablet (8 mg total) by mouth every 8 (eight) hours as needed for nausea or vomiting. 10/08/14   Rodolph Bong, MD  ondansetron (ZOFRAN-ODT) 4 MG disintegrating tablet Take 1 tablet (4 mg total) by mouth every 8 (eight) hours as needed for nausea or vomiting. 08/30/14   Ozella Rocks, MD  predniSONE (DELTASONE) 20 MG tablet Take 1 tablet (20 mg total) by mouth 2 (two) times daily. 11/25/14   Reuben Likes, MD   BP 123/86 mmHg  Pulse 110  Temp(Src) 97 F (36.1 C) (Oral)  Resp 16  SpO2 95% Physical  Exam  Constitutional: She is oriented to person, place, and time. She appears well-developed and well-nourished.  HENT:  Right Ear: External ear normal.  Left Ear: External ear normal.  Mouth/Throat: Oropharynx is clear and moist.  Neck: Normal range of motion. Neck supple.  Cardiovascular: Regular rhythm, normal heart sounds and intact distal pulses.   Pulmonary/Chest: Effort normal. No respiratory distress. She has wheezes. She has rhonchi.  Lymphadenopathy:    She has no cervical adenopathy.  Neurological: She is alert and oriented to person, place, and time.  Skin: Skin is warm and dry.  Nursing note and vitals reviewed.   ED Course  Procedures (including critical care time) Labs Review Labs Reviewed - No data to display  Imaging Review No results found.   MDM   1. Asthmatic bronchitis with acute exacerbation        Linna Hoff, MD 01/07/15 [phone]

## 2015-01-24 NOTE — ED Notes (Signed)
Patient returned to clinic w rx that had not been filled from 1-12 visit. Asking for pill form of her Lortab instead of liquid. Was here on 1-12 w asthmatic bronchitis, given liquid to treat cough. States she has not been able to get the liquid filled, as the pharmacy states this is not available in this formulation. Discussed w Dr Artis FlockKindl , who has declined to give a new Rx for a 732 week old narcotic Rx . Patient was informed of this, and stated the Rx was supposed to be for migraines. Was advised to follow up with her regular MD for HA medicine

## 2015-05-05 ENCOUNTER — Ambulatory Visit (INDEPENDENT_AMBULATORY_CARE_PROVIDER_SITE_OTHER): Payer: Medicaid Other | Admitting: Family Medicine

## 2015-05-05 ENCOUNTER — Encounter: Payer: Self-pay | Admitting: Family Medicine

## 2015-05-05 VITALS — BP 118/88 | HR 92 | Temp 98.2°F | Ht 65.0 in | Wt 155.1 lb

## 2015-05-05 DIAGNOSIS — G8929 Other chronic pain: Secondary | ICD-10-CM | POA: Insufficient documentation

## 2015-05-05 DIAGNOSIS — F4321 Adjustment disorder with depressed mood: Secondary | ICD-10-CM

## 2015-05-05 DIAGNOSIS — F4320 Adjustment disorder, unspecified: Secondary | ICD-10-CM | POA: Insufficient documentation

## 2015-05-05 DIAGNOSIS — M549 Dorsalgia, unspecified: Secondary | ICD-10-CM

## 2015-05-05 DIAGNOSIS — E785 Hyperlipidemia, unspecified: Secondary | ICD-10-CM | POA: Diagnosis not present

## 2015-05-05 LAB — LIPID PANEL
CHOL/HDL RATIO: 6.1 ratio
CHOLESTEROL: 273 mg/dL — AB (ref 0–200)
HDL: 45 mg/dL — ABNORMAL LOW (ref >=46)
LDL CALC: 154 mg/dL — AB (ref 0–99)
TRIGLYCERIDES: 369 mg/dL — AB (ref <150)
VLDL: 74 mg/dL — AB (ref 0–40)

## 2015-05-05 LAB — POCT GLYCOSYLATED HEMOGLOBIN (HGB A1C): Hemoglobin A1C: 5.6

## 2015-05-05 NOTE — Progress Notes (Signed)
One of the assigned preceptor for the morning. 

## 2015-05-05 NOTE — Assessment & Plan Note (Signed)
PHQ-9 16 + somewhat difficult Not interested in therapy or medications Will continue to monitor

## 2015-05-05 NOTE — Assessment & Plan Note (Signed)
Repeat lipid panel and A1c today

## 2015-05-05 NOTE — Patient Instructions (Signed)
Nice to meet you today.  We will get some lab work. Someone will call you or send you a letter about the results in the next week.  I will plan to see you in a few weeks to discuss other issues.  Take care, Dr. BLeonard Schwartz

## 2015-05-05 NOTE — Progress Notes (Signed)
   Subjective:   [name] is a 49 y.o. female with a history of panic attacks, migraines, chronic back pain here for establishing care with new PCP.  Her mother died unexpectedly last week - feeling depressed over this - trying to take care of her 2 sons - PHQ 419 - [phone] + somewhat difficult - h/o panic attacks and GAD - never had treatment for depression - no psych hospitalizations - no SI/HI or h/o  - not interested in medications or therapy at this time - thinks this is grief and may pass  HLD: patient reports HLD, but not on any meds, thinks she just wasn't fasting, several years since tested.  Plans to make another appt to discuss: migraines, screening recommendations, pap smear  Review of Systems:  Per HPI. All other systems reviewed and are negative.   PMH, PSH, Medications, Allergies, and FmHx reviewed and updated in EMR.  Social History: current smoker - 1 PPD  Objective:  BP 118/88 mmHg  Pulse 92  Temp(Src) 98.2 F (36.8 C) (Oral)  Ht 5\' 5"  (1.651 m)  Wt 155 lb 2 oz (70.364 kg)  BMI 25.81 kg/m2  Gen:  49 y.o. female in NAD HEENT: NCAT, MMM, EOMI CV: RRR, no MRG Resp: Non-labored, CTAB, no wheezes noted Ext: WWP, no edema Neuro: Alert and oriented, speech normal     Assessment:     [name] is a 49 y.o. female here for establishing care, grief.    Plan:     See problem list for problem-specific plans.   Erasmo DownerAngela M Lelaina Oatis, MD PGY-1,  [facility] 05/05/2015  9:30 AM

## 2015-05-07 ENCOUNTER — Encounter: Payer: Self-pay | Admitting: Family Medicine

## 2015-05-09 ENCOUNTER — Telehealth: Payer: Self-pay | Admitting: Family Medicine

## 2015-05-09 NOTE — Telephone Encounter (Signed)
Can Dr B call in [place] for pts migraine?

## 2015-05-09 NOTE — Telephone Encounter (Signed)
As discussed at last appt (new patient appointment), we need to discuss patient's migraines at her next visit (scheduled for 5/18) before anything can be prescribed.  I will not send Rx at this time, because we have not discussed this problem or previous medications tried, etc.  Please let the patient know.  Erasmo DownerAngela M Mathias Bogacki, MD, MPH PGY-1,  [facility] 05/09/2015 1:33 PM

## 2015-05-14 ENCOUNTER — Ambulatory Visit (INDEPENDENT_AMBULATORY_CARE_PROVIDER_SITE_OTHER): Payer: Medicaid Other | Admitting: Family Medicine

## 2015-05-14 VITALS — BP 135/110 | HR 102 | Wt 153.0 lb

## 2015-05-14 DIAGNOSIS — M79622 Pain in left upper arm: Secondary | ICD-10-CM | POA: Diagnosis not present

## 2015-05-14 DIAGNOSIS — G43109 Migraine with aura, not intractable, without status migrainosus: Secondary | ICD-10-CM | POA: Diagnosis not present

## 2015-05-14 DIAGNOSIS — R29898 Other symptoms and signs involving the musculoskeletal system: Secondary | ICD-10-CM | POA: Diagnosis present

## 2015-05-14 DIAGNOSIS — M25522 Pain in left elbow: Secondary | ICD-10-CM

## 2015-05-14 MED ORDER — KETOROLAC TROMETHAMINE 60 MG/2ML IM SOLN
60.0000 mg | Freq: Once | INTRAMUSCULAR | Status: AC
  Administered 2015-05-14: 60 mg via INTRAMUSCULAR

## 2015-05-14 NOTE — Assessment & Plan Note (Signed)
Patient with long history of migraines History of multiple medications and is looking for narcotics for migraine Toradol IM 60 mg today Give patient the option of referral to pain or headache clinic, will refer to headache clinic as this was patient's choice Emphasized that it would be dangerous to take narcotics, butalbital, and benzos together Emphasized that I would not prescribe narcotics or butalbital for her migraines as this is very dangerous and not good medical care and not indicated for migraines

## 2015-05-14 NOTE — Progress Notes (Signed)
   Subjective:   [name] is a 49 y.o. female with a history of GAD, migraines, panic attacks, grief here for migraines and left arm numbness/pain.  Migraines: - Psychiatrist recently started trazodone for sleep, but makes HAs worse - used to be seen at pain management clinic and feels like she was well controlled with vicodin (also for back pain), butalbital (caffeine in fiorecet was giving rebound migraines), muscle relaxant for tension HAs in neck - would like to resume these - broke contract with pain management in June b/c of missed appt - crying and grief are triggers - tried topamax (SOB, swelling SEs), imitrex didn't work because she wakes up with migraines and can't head them off, propranolol, nortriptyline (didnt work) - now she is not taking anything for them - seen at headache clinic in the past - has N/V, photophobia/phonophobia, pain starts in tense muscle in neck, one sided, aura 24-48 hrs before they come on (black and white flashing lights)  Left arm pain: - woke up 2 days ago and felt like L arm was asleep - been the same for 2 days, not getting worse or better - tingling, pins and needles, and also weak - can't pick up small objects - never had anything like this before, but does have h/o panic attacks that presented like strokes with one side of body going weak  Grief: - mother died 2 weeks ago - keeps seeing her laying in the floor dead - cannot sleep well 2/2 this and decreased appetite   Review of Systems:  Per HPI. All other systems reviewed and are negative.   PMH, PSH, Medications, Allergies, and FmHx reviewed and updated in EMR.  Social History: current smoker - 1/2 PPD  Objective:  BP 135/110 mmHg  Pulse 102  Wt 153 lb (69.4 kg)  SpO2 100%  Gen:  49 y.o. female in NAD, eyes do not focus on one area, patient seems distracted and not fully listening HEENT: NCAT, MMM, EOMI, PERRL, anicteric sclerae CV: RRR, no MRG Resp: Non-labored, CTAB, no  wheezes noted Ext: WWP, no edema Neuro: Alert and oriented, speech normal, CN 2-12 intact, full ROM intact except L shoulder abduction and flexion, L hand flexion and extension and left wrist extension. Sensation grossly intact to light touch in upper and lower extremities. Strength 5 out of 5 throughout all muscle groups in bilateral upper and lower extremities. Grip strength intact in left hand. Deep tendon reflexes 2+ and symmetric in upper extremities     Assessment:     [name] is a 49 y.o. female here for migraines and left arm numbness    Plan:     See problem list for problem-specific plans.   Erasmo DownerAngela M Timoty Bourke, MD PGY-1,  [facility] 05/14/2015  4:31 PM

## 2015-05-14 NOTE — Assessment & Plan Note (Signed)
Could be related to a pinched nerve from sleeping on the arm wrong, complex migraine, stress or anxiety reaction Neuro exam not consistent with cervical radiculopathy as it is over multiple dermatomes but spares the elbow Deep tendon reflexes intact and symmetric it is reassuring that this is not neurologic Neuro exam not consistent with strength intact but patient unable or unwilling to move certain areas Return precautions given, see AVS

## 2015-05-14 NOTE — Patient Instructions (Signed)
Nice to see you again today.  For your migraines, I think you need some specialist help.  You have tried all of the things that I usually prescribe for migraines.  I will refer you to headache clinic (not [facility]).  For your arm numbness/pain, this could be related to sleeping on it wrong or complex migraines.  If it gets worse, is not better by Friday, you develop weakness elsewhere, you have speech difficult, you have visual changes, or the pain worsens, please go to the emergency department.  Take care, Dr. B  Migraine Headache A migraine headache is an intense, throbbing pain on one or both sides of your head. A migraine can last for 30 minutes to several hours. CAUSES  The exact cause of a migraine headache is not always known. However, a migraine may be caused when nerves in the brain become irritated and release chemicals that cause inflammation. This causes pain. Certain things may also trigger migraines, such as:  Alcohol.  Smoking.  Stress.  Menstruation.  Aged cheeses.  Foods or drinks that contain nitrates, glutamate, aspartame, or tyramine.  Lack of sleep.  Chocolate.  Caffeine.  Hunger.  Physical exertion.  Fatigue.  Medicines used to treat chest pain (nitroglycerine), birth control pills, estrogen, and some blood pressure medicines. SIGNS AND SYMPTOMS  Pain on one or both sides of your head.  Pulsating or throbbing pain.  Severe pain that prevents daily activities.  Pain that is aggravated by any physical activity.  Nausea, vomiting, or both.  Dizziness.  Pain with exposure to bright lights, loud noises, or activity.  General sensitivity to bright lights, loud noises, or smells. Before you get a migraine, you may get warning signs that a migraine is coming (aura). An aura may include:  Seeing flashing lights.  Seeing bright spots, halos, or zigzag lines.  Having tunnel vision or blurred vision.  Having feelings of numbness or  tingling.  Having trouble talking.  Having muscle weakness. DIAGNOSIS  A migraine headache is often diagnosed based on:  Symptoms.  Physical exam.  A CT scan or MRI of your head. These imaging tests cannot diagnose migraines, but they can help rule out other causes of headaches. TREATMENT Medicines may be given for pain and nausea. Medicines can also be given to help prevent recurrent migraines.  HOME CARE INSTRUCTIONS  Only take over-the-counter or prescription medicines for pain or discomfort as directed by your health care provider. The use of long-term narcotics is not recommended.  Lie down in a dark, quiet room when you have a migraine.  Keep a journal to find out what may trigger your migraine headaches. For example, write down:  What you eat and drink.  How much sleep you get.  Any change to your diet or medicines.  Limit alcohol consumption.  Quit smoking if you smoke.  Get 7-9 hours of sleep, or as recommended by your health care provider.  Limit stress.  Keep lights dim if bright lights bother you and make your migraines worse. SEEK IMMEDIATE MEDICAL CARE IF:   Your migraine becomes severe.  You have a fever.  You have a stiff neck.  You have vision loss.  You have muscular weakness or loss of muscle control.  You start losing your balance or have trouble walking.  You feel faint or pass out.  You have severe symptoms that are different from your first symptoms. MAKE SURE YOU:   Understand these instructions.  Will watch your condition.  Will get  help right away if you are not doing well or get worse. Document Released: 12/13/2005 Document Revised: 04/29/2014 Document Reviewed: 08/20/2013 [facility] Patient Information 2015 [place]. This information is not intended to replace advice given to you by your health care provider. Make sure you discuss any questions you have with your health care provider.

## 2015-05-15 ENCOUNTER — Telehealth: Payer: Self-pay | Admitting: Family Medicine

## 2015-05-15 DIAGNOSIS — G8929 Other chronic pain: Secondary | ICD-10-CM

## 2015-05-15 DIAGNOSIS — G43109 Migraine with aura, not intractable, without status migrainosus: Secondary | ICD-10-CM

## 2015-05-15 DIAGNOSIS — M549 Dorsalgia, unspecified: Principal | ICD-10-CM

## 2015-05-15 NOTE — Telephone Encounter (Signed)
Mrs. [name] is calling because she wanted to make sure that it was understood during her appt yesterday that she would prefer to be referred to a pain clinic and not a headache clinic (because of her back pain as well). Additionally, she has received Butalbital-Acetaminophen (BUPAP) 50-300 MG TABS in the past for her migraines, she understands the conditions in which she needs to take this medication (i.e. Not with her valium or xanax), and she would only take them as needed for her migraines until the pain clinic appointments have been established. If she can receive this medication, she would like for it to be sent to [facility] at [address], [place], [place] [address]; Phone: [phone]. Please contact her whether or not this can be completed. Thank you, Dorothey BasemanSadie Reynolds, ASA

## 2015-05-15 NOTE — Progress Notes (Signed)
I was preceptor the day of this visit.   
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
## 2015-05-22 ENCOUNTER — Ambulatory Visit: Payer: Self-pay | Admitting: Neurology

## 2015-05-22 ENCOUNTER — Telehealth: Payer: Self-pay | Admitting: *Deleted

## 2015-05-22 NOTE — Telephone Encounter (Signed)
Pt calls triage because she is having ankle swelling x 2 days.   Does not recall injury.  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 She denies SOB and endorses having "a little chest pain", but she thinks it is because of her migraine (states that this type of pain happens when she has a migraine).  She already has appt in the AM.  She will have her neighbor check her BP and if not at unsafe range and does not experience SOB or worsening CP she will wait until appt in the AM.    Did advise pt to rest and elevated legs today. She is agreeable. Fleeger, Maryjo RochesterJessica Dawn, CMA

## 2015-05-22 NOTE — Telephone Encounter (Signed)
Patient no showed her new patient appt today.

## 2015-05-23 ENCOUNTER — Encounter: Payer: Self-pay | Admitting: Neurology

## 2015-05-23 ENCOUNTER — Ambulatory Visit: Payer: Medicaid Other | Admitting: Family Medicine

## 2015-05-26 ENCOUNTER — Telehealth: Payer: Self-pay | Admitting: Family Medicine

## 2015-05-26 NOTE — Telephone Encounter (Signed)
Family Medicine After hours phone call  Pt calls in complaining of continued headache. She states her mother passed away about 1 month ago. She has had continued migraine (reports history of this) and that she was told by Dr. Huston FoleyBacigalupa that if it continued she would be prescribed butalbital. She has had trouble sleeping, and thinks both stress and the "barometric" pressure are contributing. I reviewed Dr. Rock NephewBacigalupa's notes from the past few weeks and reiterated to her that while she takes 4mg  total xanax a day (prescribed by psychiatrist) it would not be safe for her to take butalbital. She talks about how [facility] and that she was on butalbital/muscle relaxers/xanax, but that she broke her contract last year and has not been seen by them recently. She reports only trying the xanax for pain/sleep over the past several days. I told her that I would not be prescribing butalbital over the phone, that if her headache is severe enough she could go to UC or ED and be re-evaluated, possibly get a triptan/abortive medication or migraine cocktail. Pt states she has had sumatriptan in the past and it never worked, amongst trying multiple other medications. She then stated she looked in her medicine cabinet and there is tylenol #3 that is prescribed to her son who also has headaches, and melatonin (she did not ask if she could take the tylenol #3). Informed her melatonin would be okay for her to use to try and help her sleep but would not necessarily help with the migraine pain. She felt she would like to try the melatonin as she felt her lack of sleep was a primary reason for her headache. Also informed that she should try tylenol/ibuprofen/aleve for relief. Pt appreciative and no further questions.  Tawni CarnesAndrew Tuan Tippin, MD 05/26/2015, 9:23 AM PGY-2, [facility]

## 2015-05-27 ENCOUNTER — Telehealth: Payer: Self-pay | Admitting: Family Medicine

## 2015-05-27 NOTE — Telephone Encounter (Signed)
Neurologist has suggested pt to get 5mg  of hydrocodone to help with back pain Please advise

## 2015-05-27 NOTE — Telephone Encounter (Signed)
Called patient back.  No answer, left VM with no patient sensitive informatio or identifiers.  Asked patient to call back to discuss medications.   Patient said that neuro wanted her to have hydrocodone for pain, but she no showed appt with them.  Erasmo DownerAngela M Mike Berntsen, MD, MPH PGY-1,  [facility] 05/27/2015 4:30 PM

## 2015-05-28 ENCOUNTER — Encounter: Payer: Self-pay | Admitting: Family Medicine

## 2015-05-28 NOTE — Telephone Encounter (Signed)
Pt called back. 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

## 2015-05-29 ENCOUNTER — Telehealth: Payer: Self-pay | Admitting: Family Medicine

## 2015-05-29 NOTE — Telephone Encounter (Signed)
Patient reports that she does not need any pain medication daily.  She does not want to go back to the pain clinic where they pile on pain medications.  Reports that she told Dr. Waynetta SandyWight that she didn't like the butalbital.  The hydrocodone is the only medication that won't knock her out or make her hyper.  She reports that if her "nerves" were better, she wouldn't need as much Xanax.  Thinks that she needs hydrocodone to clean house and deal with children.  Reiterated that combinations of benzos, butalbital and narcotics can be deadly.  She reports that she believes this is what her mother died of, but she hasn't died in the past when she has taken them together.  She reports taht she has a neurology appt today.  I voiced that I hope they can find some solutions for her, but as a clinic, we will not be prescribing any controlled substances, including narcotics, barbituates, or benzodiazepines.  Erasmo DownerAngela M Bacigalupo, MD, MPH PGY-1,  [facility] 05/29/2015 11:11 AM

## 2015-05-29 NOTE — Telephone Encounter (Signed)
Patient requests pain medication (she has already talked with PCP). Please, follow up with Patient.

## 2015-05-29 NOTE — Telephone Encounter (Signed)
Please see other phione message from today, detailing my discussion with patient.  Erasmo DownerAngela M Karam Dunson, MD, MPH PGY-1,  [facility] 05/29/2015 11:12 AM

## 2015-06-02 ENCOUNTER — Emergency Department (HOSPITAL_COMMUNITY)
Admission: EM | Admit: 2015-06-02 | Discharge: 2015-06-02 | Disposition: A | Discharge status: Home / Self Care | Payer: Medicaid Other | Attending: Emergency Medicine | Admitting: Emergency Medicine

## 2015-06-02 ENCOUNTER — Encounter (HOSPITAL_COMMUNITY): Payer: Self-pay | Admitting: Emergency Medicine

## 2015-06-02 DIAGNOSIS — Y9289 Other specified places as the place of occurrence of the external cause: Secondary | ICD-10-CM | POA: Diagnosis not present

## 2015-06-02 DIAGNOSIS — S4992XA Unspecified injury of left shoulder and upper arm, initial encounter: Secondary | ICD-10-CM | POA: Insufficient documentation

## 2015-06-02 DIAGNOSIS — Z8639 Personal history of other endocrine, nutritional and metabolic disease: Secondary | ICD-10-CM | POA: Diagnosis not present

## 2015-06-02 DIAGNOSIS — Z79899 Other long term (current) drug therapy: Secondary | ICD-10-CM | POA: Diagnosis not present

## 2015-06-02 DIAGNOSIS — F411 Generalized anxiety disorder: Secondary | ICD-10-CM | POA: Diagnosis not present

## 2015-06-02 DIAGNOSIS — I252 Old myocardial infarction: Secondary | ICD-10-CM | POA: Insufficient documentation

## 2015-06-02 DIAGNOSIS — W01198A Fall on same level from slipping, tripping and stumbling with subsequent striking against other object, initial encounter: Secondary | ICD-10-CM | POA: Insufficient documentation

## 2015-06-02 DIAGNOSIS — W19XXXA Unspecified fall, initial encounter: Secondary | ICD-10-CM

## 2015-06-02 DIAGNOSIS — Y9389 Activity, other specified: Secondary | ICD-10-CM | POA: Diagnosis not present

## 2015-06-02 DIAGNOSIS — Y998 Other external cause status: Secondary | ICD-10-CM | POA: Insufficient documentation

## 2015-06-02 DIAGNOSIS — Z8719 Personal history of other diseases of the digestive system: Secondary | ICD-10-CM | POA: Insufficient documentation

## 2015-06-02 DIAGNOSIS — S3992XA Unspecified injury of lower back, initial encounter: Secondary | ICD-10-CM | POA: Diagnosis not present

## 2015-06-02 DIAGNOSIS — G8929 Other chronic pain: Secondary | ICD-10-CM | POA: Diagnosis not present

## 2015-06-02 DIAGNOSIS — Z720 Tobacco use: Secondary | ICD-10-CM | POA: Diagnosis not present

## 2015-06-02 MED ORDER — OXYCODONE-ACETAMINOPHEN 5-325 MG PO TABS: 2.0000 | ORAL_TABLET | ORAL | Status: DC | PRN

## 2015-06-02 MED ORDER — NAPROXEN 500 MG PO TABS: 500.0000 mg | ORAL_TABLET | Freq: Two times a day (BID) | ORAL | Status: DC

## 2015-06-02 MED ORDER — METHOCARBAMOL 500 MG PO TABS: 500.0000 mg | ORAL_TABLET | Freq: Two times a day (BID) | ORAL | Status: DC

## 2015-06-02 NOTE — ED Provider Notes (Signed)
CSN: [id number]     Arrival date & time 06/02/15  1015 History   First MD Initiated Contact with Patient 06/02/15 1024     Chief Complaint  Patient presents with  . Fall     (Consider location/radiation/quality/duration/timing/severity/associated sxs/prior Treatment) The history is provided by the patient. No language interpreter was used.   Miss [name] is a 49 year old female history of hyperlipidemia, GERD, chronic back pain, MI, anxiety who presents for slip and fall in dog urine while going to the bathroom in the early this morning. She states that she went into a split. She is now complaining of aching lower back pain and right scapular pain. She states that she took one of her leftover hydrocodone last night at 8 PM and 1 this morning at 6 AM. She states standing for long periods of time make it worse. She denies any head injury, loss of consciousness, chest pain, shortness of breath, abdominal pain, neck pain. Past Medical History  Diagnosis Date  . Tobacco use disorder   . Generalized anxiety disorder   . Panic disorder without agoraphobia   . Abnormal maternal glucose tolerance, complicating pregnancy, childbirth, or the puerperium, unspecified as to episode of care   . Migraine     since age 89  . Acute MI 2004  . Chronic back pain   . GERD (gastroesophageal reflux disease)   . Hyperlipidemia    Past Surgical History  Procedure Laterality Date  . Rhinoplasty    . Bilateral salpingoophorectomy  12/11    by DrBernardo  . Abdominal hysterectomy  2009  . Cholecystectomy    . Tonsillectomy    . Adenoidectomy    . Cesarean section  2004, 2006   Family History  Problem Relation Age of Onset  . Coronary artery disease Father   . Asthma Mother   . Heart disease Mother   . Depression Mother   . Anxiety disorder Mother   . Heart disease Father   . Depression Father   . Anxiety disorder Father   . Hyperlipidemia Father   . Hypertension Father   . Stroke Father   .  Depression Brother   . Anxiety disorder Brother   . Hypertension Brother    History  Substance Use Topics  . Smoking status: Current Some Day Smoker -- 0.50 packs/day    Types: Cigarettes    Start date: 05/04/1985    Last Attempt to Quit: 08/27/2009  . Smokeless tobacco: Never Used  . Alcohol Use: No   OB History    Gravida Para Term Preterm AB TAB SAB Ectopic Multiple Living   2 2             Review of Systems  Gastrointestinal: Negative for nausea and vomiting.  Musculoskeletal: Negative for joint swelling and gait problem.  Skin: Negative for wound.  Neurological: Negative for dizziness, syncope, weakness and headaches.      Allergies  Imitrex; Relpax; and Topamax  Home Medications   Prior to Admission medications   Medication Sig Start Date End Date Taking? Authorizing Provider  ALPRAZolam Prudy Feeler(XANAX) 1 MG tablet Take 1 mg by mouth 4 (four) times daily.     Historical Provider, MD  diazepam (VALIUM) 10 MG tablet Take 10 mg by mouth at bedtime.    Historical Provider, MD  methocarbamol (ROBAXIN) 500 MG tablet Take 1 tablet (500 mg total) by mouth 2 (two) times daily. 06/02/15   Catha GosselinHanna Patel-Mills, PA-C  Multiple Vitamin (MULTIVITAMIN WITH MINERALS) TABS tablet Take  1 tablet by mouth daily.    Historical Provider, MD  naproxen (NAPROSYN) 500 MG tablet Take 1 tablet (500 mg total) by mouth 2 (two) times daily. 06/02/15   Kiersten Coss Patel-Mills, PA-C  oxyCODONE-acetaminophen (PERCOCET/ROXICET) 5-325 MG per tablet Take 2 tablets by mouth every 4 (four) hours as needed for severe pain. 06/02/15   Dagmar Adcox Patel-Mills, PA-C   BP 162/94 mmHg  Pulse 96  Temp(Src) 98.1 F (36.7 C) (Oral)  Resp 18  SpO2 100% Physical Exam  Constitutional: She is oriented to person, place, and time. She appears well-developed and well-nourished.  HENT:  Head: Normocephalic and atraumatic.  Eyes: Conjunctivae are normal.  Neck: Normal range of motion.  Cardiovascular: Normal rate.   Pulmonary/Chest: Effort  normal.  Musculoskeletal:       Right shoulder: Normal.  Patient is able to move right arm with abduction above her head and abduction.  She has bilateral lumbar paravertebral tenderness to palpation. Patient has no midline thoracic or lumbar tenderness. Negative straight leg raise to 90. Normal sensation in lower extremities bilaterally. No erythema, ecchymosis, edema to the flank or back area. She is ambulating without difficulty in triage.  No foot, leg, hip injury or pain.  Neurological: She is alert and oriented to person, place, and time.  Skin: Skin is warm and dry.    ED Course  Procedures (including critical care time) Labs Review Labs Reviewed - No data to display  Imaging Review No results found.   EKG Interpretation None      MDM   Final diagnoses:  Fall, initial encounter  Patient presents for lower back pain and left scapular pain after slip and fall early this morning. She denies any bowel or bladder incontinence or retention. No history of IV drug use. No prior history of cancer. She is ambulating without difficulty in triage. I do not believe imaging is necessary at this time. I did not give her pain medication in the ED since she is driving home. I have written for Robaxin, Percocet, and naproxen. She can follow-up with her PCP within 3 days and verbally agrees with the plan. She is to return for any bowel or bladder retention or incontinence, fever, worsening pain.     Catha Gosselin, PA-C 06/02/15 1300  Bethann Berkshire, MD 06/03/15 [phone]

## 2015-06-02 NOTE — Discharge Instructions (Signed)
Fall Prevention and Home Safety Take medications as prescribed. Follow-up with your primary care physician in 3 days. Falls cause injuries and can affect all age groups. It is possible to use preventive measures to significantly decrease the likelihood of falls. There are many simple measures which can make your home safer and prevent falls. OUTDOORS  Repair cracks and edges of walkways and driveways.  Remove high doorway thresholds.  Trim shrubbery on the main path into your home.  Have good outside lighting.  Clear walkways of tools, rocks, debris, and clutter.  Check that handrails are not broken and are securely fastened. Both sides of steps should have handrails.  Have leaves, snow, and ice cleared regularly.  Use sand or salt on walkways during winter months.  In the garage, clean up grease or oil spills. BATHROOM  Install night lights.  Install grab bars by the toilet and in the tub and shower.  Use non-skid mats or decals in the tub or shower.  Place a plastic non-slip stool in the shower to sit on, if needed.  Keep floors dry and clean up all water on the floor immediately.  Remove soap buildup in the tub or shower on a regular basis.  Secure bath mats with non-slip, double-sided rug tape.  Remove throw rugs and tripping hazards from the floors. BEDROOMS  Install night lights.  Make sure a bedside light is easy to reach.  Do not use oversized bedding.  Keep a telephone by your bedside.  Have a firm chair with side arms to use for getting dressed.  Remove throw rugs and tripping hazards from the floor. KITCHEN  Keep handles on pots and pans turned toward the center of the stove. Use back burners when possible.  Clean up spills quickly and allow time for drying.  Avoid walking on wet floors.  Avoid hot utensils and knives.  Position shelves so they are not too high or low.  Place commonly used objects within easy reach.  If necessary, use a  sturdy step stool with a grab bar when reaching.  Keep electrical cables out of the way.  Do not use floor polish or wax that makes floors slippery. If you must use wax, use non-skid floor wax.  Remove throw rugs and tripping hazards from the floor. STAIRWAYS  Never leave objects on stairs.  Place handrails on both sides of stairways and use them. Fix any loose handrails. Make sure handrails on both sides of the stairways are as long as the stairs.  Check carpeting to make sure it is firmly attached along stairs. Make repairs to worn or loose carpet promptly.  Avoid placing throw rugs at the top or bottom of stairways, or properly secure the rug with carpet tape to prevent slippage. Get rid of throw rugs, if possible.  Have an electrician put in a light switch at the top and bottom of the stairs. OTHER FALL PREVENTION TIPS  Wear low-heel or rubber-soled shoes that are supportive and fit well. Wear closed toe shoes.  When using a stepladder, make sure it is fully opened and both spreaders are firmly locked. Do not climb a closed stepladder.  Add color or contrast paint or tape to grab bars and handrails in your home. Place contrasting color strips on first and last steps.  Learn and use mobility aids as needed. Install an electrical emergency response system.  Turn on lights to avoid dark areas. Replace light bulbs that burn out immediately. Get light switches that glow.  Arrange furniture to create clear pathways. Keep furniture in the same place.  Firmly attach carpet with non-skid or double-sided tape.  Eliminate uneven floor surfaces.  Select a carpet pattern that does not visually hide the edge of steps.  Be aware of all pets. OTHER HOME SAFETY TIPS  Set the water temperature for 120 F (48.8 C).  Keep emergency numbers on or near the telephone.  Keep smoke detectors on every level of the home and near sleeping areas. Document Released: 12/03/2002 Document Revised:  06/13/2012 Document Reviewed: 03/03/2012 [facility] Patient Information 2015 [place], [place]. This information is not intended to replace advice given to you by your health care provider. Make sure you discuss any questions you have with your health care provider.

## 2015-06-02 NOTE — ED Notes (Signed)
Pt with Hx of back pain slipped on dog urine last night and fell to floor, hit her right toes on a door and her left foot hit other door, and pt hit coccyx on floor. Pt not on blood thinners. No head injury, no LOC.

## 2015-06-03 ENCOUNTER — Telehealth: Payer: Self-pay | Admitting: Family Medicine

## 2015-06-03 NOTE — Telephone Encounter (Signed)
Pt has decided to go to the pain clinic.  Please advise

## 2015-06-04 NOTE — Telephone Encounter (Signed)
Yes, referral was faxed to [facility] on 05/19/15. Awaiting an appt confirmation.

## 2015-06-04 NOTE — Telephone Encounter (Signed)
Please let the patient know that this is in process and someone will let her know when it is complete.  Erasmo DownerAngela M Bacigalupo, MD, MPH PGY-1,  [facility] 06/04/2015 2:33 PM

## 2015-06-04 NOTE — Telephone Encounter (Signed)
I think that the patient has already been referred to pain clinic.  I will have referral coordinator check on this.  Erasmo DownerAngela M Bacigalupo, MD, MPH PGY-1,  [facility] 06/04/2015 11:09 AM

## 2015-06-09 ENCOUNTER — Telehealth: Payer: Self-pay | Admitting: Family Medicine

## 2015-06-09 NOTE — Telephone Encounter (Signed)
Pt called the headache clinic and found out she had taken tramadol  And it seemed to help. She has cut back to 2 per day on her xanax Please advise

## 2015-06-11 NOTE — Telephone Encounter (Signed)
Will discuss with medical director as we have told this patient that our clinic will not prescribe her any controlled substances.  Erasmo Downer, MD, MPH PGY-1,  [facility] 06/11/2015 8:59 PM

## 2015-06-13 ENCOUNTER — Telehealth: Payer: Self-pay | Admitting: Family Medicine

## 2015-06-13 NOTE — Telephone Encounter (Signed)
[facility] After Hours Line  Patient has chronic back pain that triggers migraine. She is calling about getting pain medications to bridge her over until she gets to her pain clinic appointment. She states that her pain is bad enough that she cannot do as much. She states that she has been having to clean up her mother's home, who died 6 weeks ago. She states that her hydrocodone/percocet and xanax allows her to function normally. She states she was recently seen at the [facility] ED and was prescribed some Percocet, Naproxen and Baclofen, which helped (mostly the Baclofen). We discussed options including waiting until Monday for this issue to be addressed with her PCP vs going to an urgent care clinic for evaluation. Patient agreed with plan and states that she will likely go to an urgent care clinic tomorrow morning.  Jacquelin Hawking, MD PGY-2, [facility] 06/13/2015, 4:38 PM

## 2015-06-16 ENCOUNTER — Other Ambulatory Visit: Payer: Self-pay | Admitting: Family Medicine

## 2015-06-16 NOTE — Telephone Encounter (Signed)
Ms. Malanga is calling because she was supposed to receive a phone call from her PCP about giving her pain medication until she can get established at the pain clinic. Would like to speak to Dr. Jennette Kettle about it if possible. Please advise, thank you, Dorothey Baseman, ASA

## 2015-06-17 NOTE — Telephone Encounter (Signed)
Called patient back regarding requests for pain medication.  Informed the patient again that we as a clinic will not prescribe any controlled substances.  She states this is frustrating and thinks she will have to use the emergency department as a doctor's office to get her medications.  She then rushes off the phone.  Erasmo Downer, MD, MPH PGY-1,  [facility] 06/17/2015 9:46 AM

## 2015-06-18 NOTE — Telephone Encounter (Signed)
Reviewed and agree [name]

## 2015-07-17 IMAGING — CT CT NECK W/ CM
2 of 3 series · 8 of 14 positions shown, 9 images · IV contrast (OMNIPAQUE 300)
Comparison: 02/28/2010

CLINICAL DATA: Throat swelling. Tongue swelling. Query
epiglottitis.

EXAM:
CT NECK WITH CONTRAST
TECHNIQUE: Multidetector CT imaging of the neck was performed using the
standard protocol following the bolus administration of intravenous
contrast.
CONTRAST:  80mL OMNIPAQUE IOHEXOL 300 MG/ML  SOLN

[Series 3: neck with st · axial · 0.52mm/px · z∈[+1144,+1292]mm · 4 of 124 slices shown]
[im 25/124  bone]
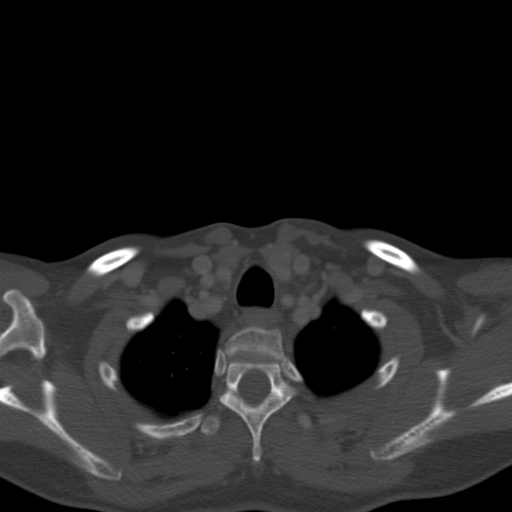
[im 50/124  bone]
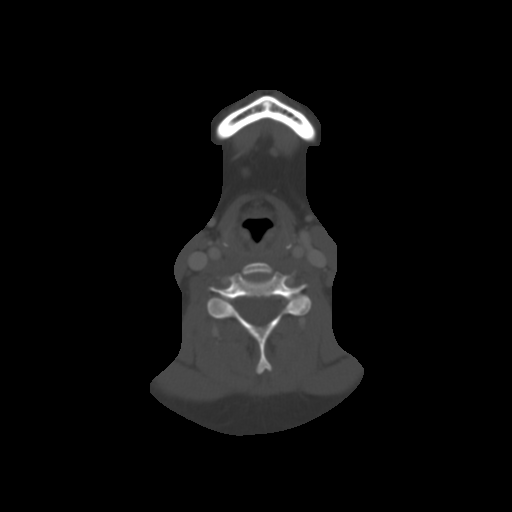
[im 74/124  bone]
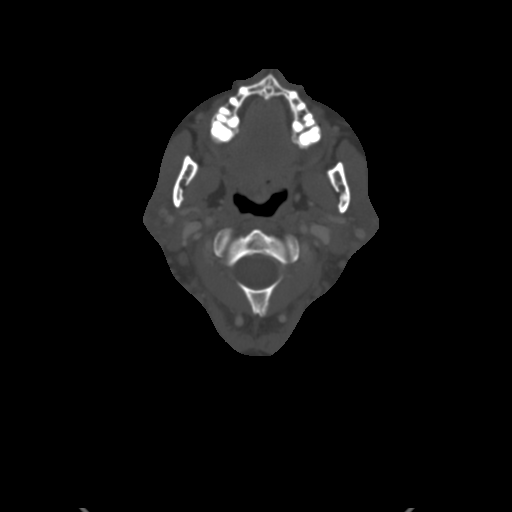
[im 99/124  bone]
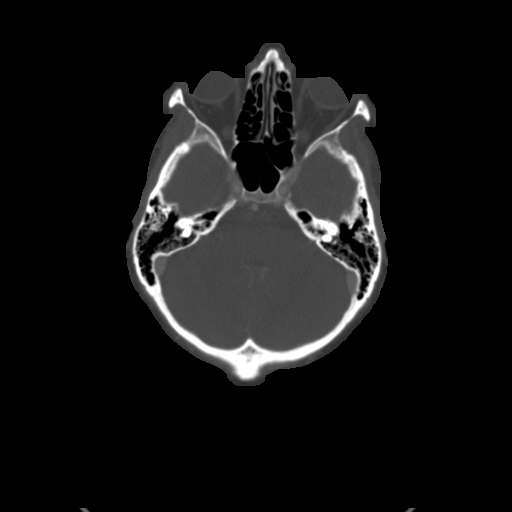

[Series 6: axial recons · axial · 0.39mm/px · z∈[+1117,+1266]mm · 4 of 136 slices shown, 5 images]
[im 28/136  soft-tissue]
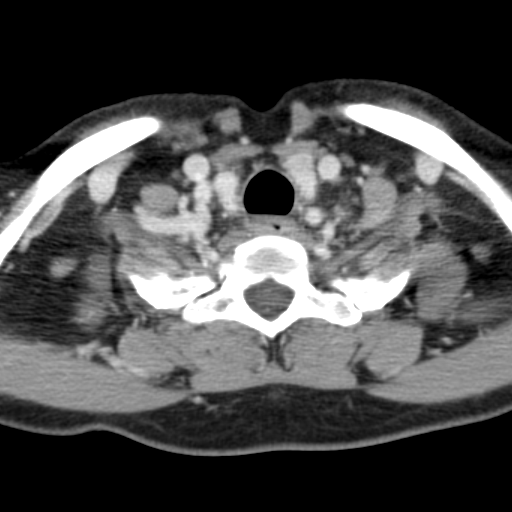
[im 28/136  bone]
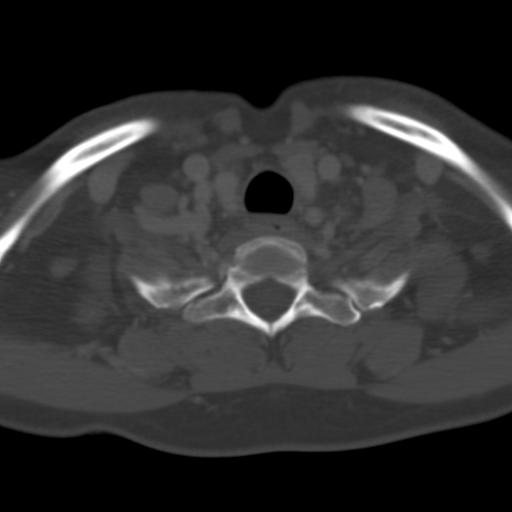
[im 55/136  bone]
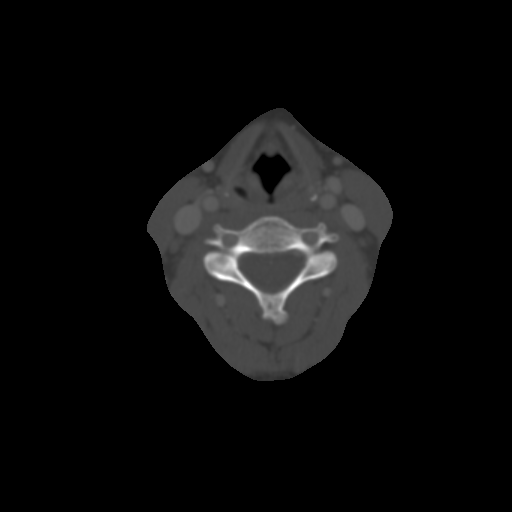
[im 82/136  bone]
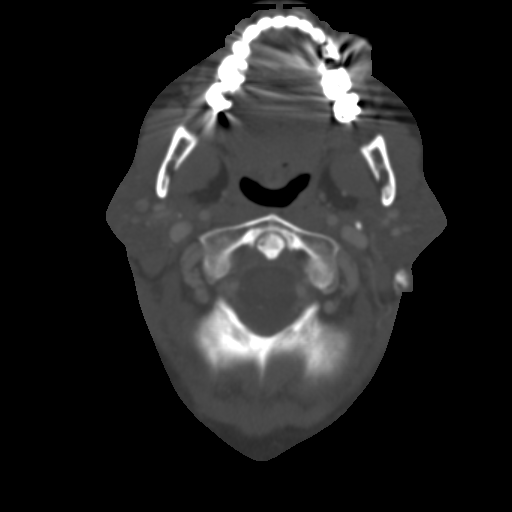
[im 109/136  bone]
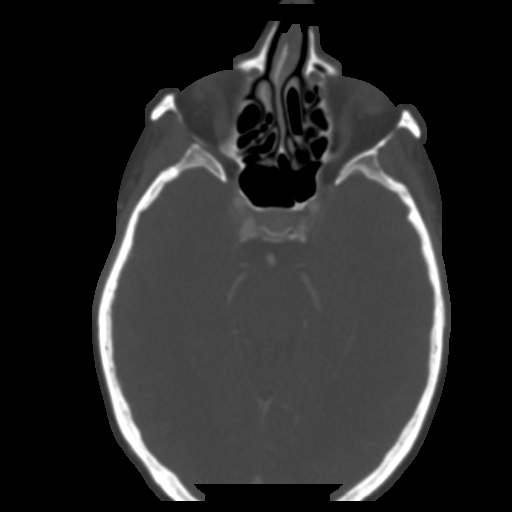

[8 of 14 positions shown; findings below may reference images not displayed]

FINDINGS: Chronic mucosal thickening in the right maxillary sinus with
evidence of prior antrostomy and approximately 9 mm of left-to-right
nasal septal deviation.

Along the inferior margin of the left parotid gland, a 0.9 by 1.4 cm
focus of probable nodal tissue is observed.

Asymmetric enhancement is present around and especially just below
the left external auditory canal compared to the right, correlate
clinically in assessing for otitis externa. Adjacent mastoid air
cells do not appear opacified and I do not see any middle ear fluid.

Small submandibular lymph nodes are observed. Left station 2 lymph
node 0.9 cm in short axis. Anterior mediastinal tissue on image 124
series 3 is partially included on today's exam and nonspecific but
probably due to thymic tissue.

Parapharyngeal spaces normal and symmetric. Thyroid gland
unremarkable.

Epiglottis and aryepiglottic folds normal. No significant
prevertebral soft tissue swelling. No glottic abnormality observed.

Lung apices unremarkable.
IMPRESSION: 1. Abnormal enhancement surrounding the left external auditory canal
compared to the right, query early otitis externa. Correlate with
physical exam findings and tenderness in this vicinity.
2. Chronic mucosal thickening in the right maxillary sinus with
evidence of prior right maxillary antrostomy. Right word nasal
septal deviation.
3. Upper normal size left station 2 lymph node. Probable small lymph
nodes in both parotid glands, or notable inferiorly on the left.
4. No findings of epiglottitis or colonic abnormality. The airway
does not appear threatened.

## 2015-07-28 ENCOUNTER — Encounter (HOSPITAL_COMMUNITY): Payer: Self-pay | Admitting: Emergency Medicine

## 2015-07-28 ENCOUNTER — Emergency Department (HOSPITAL_COMMUNITY)
Admission: EM | Admit: 2015-07-28 | Discharge: 2015-07-28 | Disposition: A | Discharge status: Home / Self Care | Payer: Medicaid Other | Attending: Emergency Medicine | Admitting: Emergency Medicine

## 2015-07-28 DIAGNOSIS — Z720 Tobacco use: Secondary | ICD-10-CM | POA: Insufficient documentation

## 2015-07-28 DIAGNOSIS — Z79899 Other long term (current) drug therapy: Secondary | ICD-10-CM | POA: Diagnosis not present

## 2015-07-28 DIAGNOSIS — K088 Other specified disorders of teeth and supporting structures: Secondary | ICD-10-CM | POA: Insufficient documentation

## 2015-07-28 DIAGNOSIS — F410 Panic disorder [episodic paroxysmal anxiety] without agoraphobia: Secondary | ICD-10-CM | POA: Diagnosis not present

## 2015-07-28 DIAGNOSIS — Z8639 Personal history of other endocrine, nutritional and metabolic disease: Secondary | ICD-10-CM | POA: Insufficient documentation

## 2015-07-28 DIAGNOSIS — G8929 Other chronic pain: Secondary | ICD-10-CM | POA: Diagnosis not present

## 2015-07-28 DIAGNOSIS — K0889 Other specified disorders of teeth and supporting structures: Secondary | ICD-10-CM

## 2015-07-28 DIAGNOSIS — I252 Old myocardial infarction: Secondary | ICD-10-CM | POA: Insufficient documentation

## 2015-07-28 MED ORDER — ONDANSETRON HCL 4 MG PO TABS: 4.0000 mg | ORAL_TABLET | Freq: Four times a day (QID) | ORAL | Status: DC

## 2015-07-28 NOTE — ED Provider Notes (Signed)
CSN: [id number]     Arrival date & time 07/28/15  4098 History   First MD Initiated Contact with Patient 07/28/15 1003     Chief Complaint  Patient presents with  . Dental Pain     (Consider location/radiation/quality/duration/timing/severity/associated sxs/prior Treatment) HPI Comments: Patient with dental pain that started 2 days ago. She reports having a tooth extracted. She is still complaining of dental pain. She reports that she was referred here by her dentist to get pain medication. She has been taking penicillin. She denies any fevers or chills. She reports some associated nausea. She states that she is scheduled to see her pain management doctor in a few weeks.  The history is provided by the patient. No language interpreter was used.    Past Medical History  Diagnosis Date  . Tobacco use disorder   . Generalized anxiety disorder   . Panic disorder without agoraphobia   . Abnormal maternal glucose tolerance, complicating pregnancy, childbirth, or the puerperium, unspecified as to episode of care   . Migraine     since age 52  . Acute MI 2004  . Chronic back pain   . GERD (gastroesophageal reflux disease)   . Hyperlipidemia    Past Surgical History  Procedure Laterality Date  . Rhinoplasty    . Bilateral salpingoophorectomy  12/11    by DrBernardo  . Abdominal hysterectomy  2009  . Cholecystectomy    . Tonsillectomy    . Adenoidectomy    . Cesarean section  2004, 2006   Family History  Problem Relation Age of Onset  . Coronary artery disease Father   . Heart disease Father   . Depression Father   . Anxiety disorder Father   . Hyperlipidemia Father   . Hypertension Father   . Stroke Father   . Asthma Mother   . Heart disease Mother   . Depression Mother   . Anxiety disorder Mother   . Depression Brother   . Anxiety disorder Brother   . Hypertension Brother    History  Substance Use Topics  . Smoking status: Current Some Day Smoker -- 0.50 packs/day   Types: Cigarettes    Start date: 05/04/1985    Last Attempt to Quit: 08/27/2009  . Smokeless tobacco: Never Used  . Alcohol Use: No   OB History    Gravida Para Term Preterm AB TAB SAB Ectopic Multiple Living   2 2             Review of Systems  Constitutional: Negative for fever and chills.  HENT: Positive for dental problem. Negative for drooling.   Neurological: Negative for speech difficulty.  Psychiatric/Behavioral: Positive for sleep disturbance.      Allergies  Imitrex; Relpax; and Topamax  Home Medications   Prior to Admission medications   Medication Sig Start Date End Date Taking? Authorizing Provider  ALPRAZolam Prudy Feeler) 1 MG tablet Take 1 mg by mouth 4 (four) times daily.    Yes Historical Provider, MD  diazepam (VALIUM) 10 MG tablet Take 10 mg by mouth at bedtime.   Yes Historical Provider, MD  ibuprofen (ADVIL,MOTRIN) 200 MG tablet Take 200-600 mg by mouth every 4 (four) hours as needed for mild pain or moderate pain.   Yes Historical Provider, MD  loperamide (IMODIUM) 2 MG capsule Take 2 mg by mouth every 6 (six) hours as needed for diarrhea or loose stools.   Yes Historical Provider, MD  Multiple Vitamin (MULTIVITAMIN WITH MINERALS) TABS tablet Take 1 tablet  by mouth daily.   Yes Historical Provider, MD  methocarbamol (ROBAXIN) 500 MG tablet Take 1 tablet (500 mg total) by mouth 2 (two) times daily. Patient not taking: Reported on 07/28/2015 06/02/15   Catha Gosselin, PA-C  naproxen (NAPROSYN) 500 MG tablet Take 1 tablet (500 mg total) by mouth 2 (two) times daily. Patient not taking: Reported on 07/28/2015 06/02/15   Catha Gosselin, PA-C  ondansetron (ZOFRAN) 4 MG tablet Take 1 tablet (4 mg total) by mouth every 6 (six) hours. 07/28/15   Roxy Horseman, PA-C  oxyCODONE-acetaminophen (PERCOCET/ROXICET) 5-325 MG per tablet Take 2 tablets by mouth every 4 (four) hours as needed for severe pain. Patient not taking: Reported on 07/28/2015 06/02/15   Hanna Patel-Mills, PA-C     BP 146/97 mmHg  Pulse 105  Temp(Src) 98.3 F (36.8 C) (Oral)  Resp 17  SpO2 100% Physical Exam  Constitutional: She is oriented to person, place, and time. She appears well-developed and well-nourished.  HENT:  Head: Normocephalic and atraumatic.  Mouth/Throat:    Poor dentition throughout.  Affected tooth as diagrammed.  No signs of peritonsillar or tonsillar abscess.  No signs of gingival abscess. Oropharynx is clear and without exudates.  Uvula is midline.  Airway is intact. No signs of Ludwig's angina with palpation of oral and sublingual mucosa.   Eyes: Conjunctivae and EOM are normal.  Neck: Normal range of motion.  Cardiovascular: Normal rate.   Pulmonary/Chest: Effort normal.  Abdominal: She exhibits no distension.  Musculoskeletal: Normal range of motion.  Neurological: She is alert and oriented to person, place, and time.  Skin: Skin is dry.  Psychiatric: She has a normal mood and affect. Her behavior is normal. Judgment and thought content normal.  Nursing note and vitals reviewed.   ED Course  Procedures (including critical care time) Labs Review Labs Reviewed - No data to display  Imaging Review No results found.   EKG Interpretation None      MDM   Final diagnoses:  Pain, dental    Patient with toothache.  No gross abscess.  Exam unconcerning for Ludwig's angina or spread of infection.  Taking penicillin, recommend OTC pain medicine.  Urged patient to follow-up with dentist.       Roxy Horseman, PA-C 07/28/15 1321  Rolan Bucco, MD 07/28/15 1547

## 2015-07-28 NOTE — ED Notes (Signed)
Pt states that she had right lower, back molar removed on Thursday and still having pain. Pt states that she called her dentist this am and was advised to come to ED.  Pt states that she is supposed to start back at pain clinic in few weeks for her migraines.

## 2015-07-28 NOTE — Discharge Instructions (Signed)

## 2015-07-28 NOTE — ED Notes (Signed)
Increased pain since dental extraction on Thursday. Pain in r/lower jaw

## 2015-08-10 ENCOUNTER — Encounter (HOSPITAL_COMMUNITY): Payer: Self-pay | Admitting: Emergency Medicine

## 2015-08-10 ENCOUNTER — Emergency Department (HOSPITAL_COMMUNITY): Payer: Medicaid Other

## 2015-08-10 ENCOUNTER — Emergency Department (HOSPITAL_COMMUNITY)
Admission: EM | Admit: 2015-08-10 | Discharge: 2015-08-10 | Disposition: A | Discharge status: Home / Self Care | Payer: Medicaid Other | Attending: Emergency Medicine | Admitting: Emergency Medicine

## 2015-08-10 DIAGNOSIS — F411 Generalized anxiety disorder: Secondary | ICD-10-CM | POA: Insufficient documentation

## 2015-08-10 DIAGNOSIS — G43909 Migraine, unspecified, not intractable, without status migrainosus: Secondary | ICD-10-CM | POA: Diagnosis not present

## 2015-08-10 DIAGNOSIS — Z720 Tobacco use: Secondary | ICD-10-CM | POA: Insufficient documentation

## 2015-08-10 DIAGNOSIS — F1310 Sedative, hypnotic or anxiolytic abuse, uncomplicated: Secondary | ICD-10-CM | POA: Diagnosis not present

## 2015-08-10 DIAGNOSIS — R002 Palpitations: Secondary | ICD-10-CM | POA: Diagnosis not present

## 2015-08-10 DIAGNOSIS — Z8639 Personal history of other endocrine, nutritional and metabolic disease: Secondary | ICD-10-CM | POA: Insufficient documentation

## 2015-08-10 DIAGNOSIS — R51 Headache: Secondary | ICD-10-CM | POA: Diagnosis present

## 2015-08-10 DIAGNOSIS — G43009 Migraine without aura, not intractable, without status migrainosus: Secondary | ICD-10-CM

## 2015-08-10 DIAGNOSIS — Z79899 Other long term (current) drug therapy: Secondary | ICD-10-CM | POA: Insufficient documentation

## 2015-08-10 DIAGNOSIS — I252 Old myocardial infarction: Secondary | ICD-10-CM | POA: Insufficient documentation

## 2015-08-10 DIAGNOSIS — G8929 Other chronic pain: Secondary | ICD-10-CM | POA: Insufficient documentation

## 2015-08-10 DIAGNOSIS — Z3202 Encounter for pregnancy test, result negative: Secondary | ICD-10-CM | POA: Diagnosis not present

## 2015-08-10 DIAGNOSIS — Z8719 Personal history of other diseases of the digestive system: Secondary | ICD-10-CM | POA: Diagnosis not present

## 2015-08-10 LAB — COMPREHENSIVE METABOLIC PANEL
ALK PHOS: 93 U/L (ref 38–126)
ALT: 28 U/L (ref 14–54)
AST: 32 U/L (ref 15–41)
Albumin: 4.6 g/dL (ref 3.5–5.0)
Anion gap: 12 (ref 5–15)
BUN: 8 mg/dL (ref 6–20)
CHLORIDE: 103 mmol/L (ref 101–111)
CO2: 21 mmol/L — AB (ref 22–32)
CREATININE: 0.79 mg/dL (ref 0.44–1.00)
Calcium: 9.6 mg/dL (ref 8.9–10.3)
GFR calc Af Amer: >60 mL/min (ref >60)
GFR calc non Af Amer: >60 mL/min (ref >60)
GLUCOSE: 171 mg/dL — AB (ref 65–99)
Potassium: 3.3 mmol/L — ABNORMAL LOW (ref 3.5–5.1)
Sodium: 136 mmol/L (ref 135–145)
TOTAL PROTEIN: 7.9 g/dL (ref 6.5–8.1)
Total Bilirubin: 0.4 mg/dL (ref 0.3–1.2)

## 2015-08-10 LAB — CBC WITH DIFFERENTIAL/PLATELET
BASOS PCT: 1 % (ref 0–1)
Basophils Absolute: 0.1 10*3/uL (ref 0.0–0.1)
EOS ABS: 0 10*3/uL (ref 0.0–0.7)
EOS PCT: 0 % (ref 0–5)
HCT: 42.3 % (ref 36.0–46.0)
HEMOGLOBIN: 15.4 g/dL — AB (ref 12.0–15.0)
Lymphocytes Relative: 30 % (ref 12–46)
Lymphs Abs: 1.5 10*3/uL (ref 0.7–4.0)
MCH: 37.1 pg — AB (ref 26.0–34.0)
MCHC: 36.4 g/dL — ABNORMAL HIGH (ref 30.0–36.0)
MCV: 101.9 fL — ABNORMAL HIGH (ref 78.0–100.0)
MONO ABS: 0.3 10*3/uL (ref 0.1–1.0)
Monocytes Relative: 5 % (ref 3–12)
NEUTROS ABS: 3.1 10*3/uL (ref 1.7–7.7)
Neutrophils Relative %: 64 % (ref 43–77)
Platelets: 344 10*3/uL (ref 150–400)
RBC: 4.15 MIL/uL (ref 3.87–5.11)
RDW: 13.4 % (ref 11.5–15.5)
WBC: 4.9 10*3/uL (ref 4.0–10.5)

## 2015-08-10 LAB — URINALYSIS, ROUTINE W REFLEX MICROSCOPIC
Bilirubin Urine: NEGATIVE
Glucose, UA: NEGATIVE mg/dL
Hgb urine dipstick: NEGATIVE
KETONES UR: NEGATIVE mg/dL
Leukocytes, UA: NEGATIVE
NITRITE: NEGATIVE
Protein, ur: NEGATIVE mg/dL
Specific Gravity, Urine: 1.006 (ref 1.005–1.030)
Urobilinogen, UA: 0.2 mg/dL (ref 0.0–1.0)
pH: 6.5 (ref 5.0–8.0)

## 2015-08-10 LAB — RAPID URINE DRUG SCREEN, HOSP PERFORMED
Amphetamines: NOT DETECTED
Barbiturates: POSITIVE — AB
Benzodiazepines: POSITIVE — AB
COCAINE: NOT DETECTED
OPIATES: NOT DETECTED
Tetrahydrocannabinol: NOT DETECTED

## 2015-08-10 LAB — LIPASE, BLOOD: LIPASE: 35 U/L (ref 22–51)

## 2015-08-10 LAB — TROPONIN I: Troponin I: <0.03 ng/mL (ref <0.031)

## 2015-08-10 MED ORDER — METOCLOPRAMIDE HCL 5 MG/ML IJ SOLN
10.0000 mg | Freq: Once | INTRAMUSCULAR | Status: AC
  Administered 2015-08-10: 10 mg via INTRAVENOUS
  Filled 2015-08-10: qty 2

## 2015-08-10 MED ORDER — MAGNESIUM SULFATE 2 GM/50ML IV SOLN
2.0000 g | Freq: Once | INTRAVENOUS | Status: AC
  Administered 2015-08-10: 2 g via INTRAVENOUS
  Filled 2015-08-10: qty 50

## 2015-08-10 MED ORDER — DIPHENHYDRAMINE HCL 50 MG/ML IJ SOLN
25.0000 mg | Freq: Once | INTRAMUSCULAR | Status: AC
  Administered 2015-08-10: 25 mg via INTRAVENOUS
  Filled 2015-08-10: qty 1

## 2015-08-10 MED ORDER — SODIUM CHLORIDE 0.9 % IV BOLUS (SEPSIS)
1000.0000 mL | Freq: Once | INTRAVENOUS | Status: AC
Start: 2015-08-10 — End: 2015-08-10
  Administered 2015-08-10: 1000 mL via INTRAVENOUS

## 2015-08-10 NOTE — ED Provider Notes (Signed)
CSN: [id number]     Arrival date & time 08/10/15  4098 History   First MD Initiated Contact with Patient 08/10/15 0830     Chief Complaint  Patient presents with  . Palpitations  . Headache     (Consider location/radiation/quality/duration/timing/severity/associated sxs/prior Treatment) HPI Comments: 49yo F w/ PMH including complex migraines, anxiety, and panic attacks who p/w heart racing and migraine. Patient states that 3 days ago, she had a gradual onset of migraine that has intermittently been severe since then and has not responded to fioricet. She states that this feels like her typical migraine. She denies any sudden headache or any neck stiffness or fever. She also states that last night around 7-8pm she began feeling her heart racing and it has continued today. She endorses constant chest pain associated with when her anxiety becomes more severe as well as shortness of breath. She has had non-bloody diarrhea but no vomiting or abdominal pain. She denies any drug or alcohol use.  FH notable for mother who died of MI at 60yo and father with heart disease.   The history is provided by the patient.    Past Medical History  Diagnosis Date  . Tobacco use disorder   . Generalized anxiety disorder   . Panic disorder without agoraphobia   . Abnormal maternal glucose tolerance, complicating pregnancy, childbirth, or the puerperium, unspecified as to episode of care   . Migraine     since age 75  . Acute MI 2004  . Chronic back pain   . GERD (gastroesophageal reflux disease)   . Hyperlipidemia    Past Surgical History  Procedure Laterality Date  . Rhinoplasty    . Bilateral salpingoophorectomy  12/11    by DrBernardo  . Abdominal hysterectomy  2009  . Cholecystectomy    . Tonsillectomy    . Adenoidectomy    . Cesarean section  2004, 2006   Family History  Problem Relation Age of Onset  . Coronary artery disease Father   . Heart disease Father   . Depression Father   .  Anxiety disorder Father   . Hyperlipidemia Father   . Hypertension Father   . Stroke Father   . Asthma Mother   . Heart disease Mother   . Depression Mother   . Anxiety disorder Mother   . Depression Brother   . Anxiety disorder Brother   . Hypertension Brother    Social History  Substance Use Topics  . Smoking status: Current Some Day Smoker -- 0.50 packs/day    Types: Cigarettes    Start date: 05/04/1985    Last Attempt to Quit: 08/27/2009  . Smokeless tobacco: Never Used  . Alcohol Use: No   OB History    Gravida Para Term Preterm AB TAB SAB Ectopic Multiple Living   2 2             Review of Systems  10 Systems reviewed and are negative for acute change except as noted in the HPI.   Allergies  Imitrex; Relpax; and Topamax  Home Medications   Prior to Admission medications   Medication Sig Start Date End Date Taking? Authorizing Provider  ALPRAZolam Prudy Feeler) 1 MG tablet Take 1 mg by mouth 4 (four) times daily.     Historical Provider, MD  diazepam (VALIUM) 10 MG tablet Take 10 mg by mouth at bedtime.    Historical Provider, MD  ibuprofen (ADVIL,MOTRIN) 200 MG tablet Take 200-600 mg by mouth every 4 (four) hours as  needed for mild pain or moderate pain.    Historical Provider, MD  loperamide (IMODIUM) 2 MG capsule Take 2 mg by mouth every 6 (six) hours as needed for diarrhea or loose stools.    Historical Provider, MD  methocarbamol (ROBAXIN) 500 MG tablet Take 1 tablet (500 mg total) by mouth 2 (two) times daily. Patient not taking: Reported on 07/28/2015 06/02/15   Catha Gosselin, PA-C  Multiple Vitamin (MULTIVITAMIN WITH MINERALS) TABS tablet Take 1 tablet by mouth daily.    Historical Provider, MD  naproxen (NAPROSYN) 500 MG tablet Take 1 tablet (500 mg total) by mouth 2 (two) times daily. Patient not taking: Reported on 07/28/2015 06/02/15   Catha Gosselin, PA-C  ondansetron (ZOFRAN) 4 MG tablet Take 1 tablet (4 mg total) by mouth every 6 (six) hours. 07/28/15    Roxy Horseman, PA-C  oxyCODONE-acetaminophen (PERCOCET/ROXICET) 5-325 MG per tablet Take 2 tablets by mouth every 4 (four) hours as needed for severe pain. Patient not taking: Reported on 07/28/2015 06/02/15   Catha Gosselin, PA-C   BP 147/83 mmHg  Pulse 103  Temp(Src) 98.7 F (37.1 C) (Oral)  Resp 17  SpO2 100% Physical Exam  Constitutional: She is oriented to person, place, and time. She appears well-developed and well-nourished. No distress.  Awake, alert  HENT:  Head: Normocephalic and atraumatic.  Moist mucous membranes  Eyes: Conjunctivae and EOM are normal. Pupils are equal, round, and reactive to light.  Neck: Neck supple.  Cardiovascular: Normal rate, regular rhythm and normal heart sounds.   No murmur heard. Pulmonary/Chest: Effort normal and breath sounds normal. No respiratory distress.  Abdominal: Soft. Bowel sounds are normal. She exhibits no distension. There is no tenderness.  Musculoskeletal: She exhibits no edema.  Neurological: She is alert and oriented to person, place, and time. She has normal reflexes. No cranial nerve deficit. She exhibits normal muscle tone.  Fluent speech, normal finger-to-nose testing, normal sensation throughout, effort dependent on strength testing with some mild RUE weakness compared to left but normal movements of RUE on finger to nose testing; 1+ DTRs throughout  Skin: Skin is warm and dry.  Psychiatric:  Slightly tangential in conversation and states that she is anxious and having panic attack but appears calm without tremors  Nursing note and vitals reviewed.   ED Course  Procedures (including critical care time) Labs Review Labs Reviewed  COMPREHENSIVE METABOLIC PANEL  LIPASE, BLOOD  TROPONIN I  CBC WITH DIFFERENTIAL/PLATELET  PREGNANCY, URINE  URINALYSIS, ROUTINE W REFLEX MICROSCOPIC (NOT AT [facility])  URINE RAPID DRUG SCREEN, HOSP PERFORMED    Imaging Review No results found.    EKG Interpretation   Date/Time:   Sunday August 10 2015 08:33:40 EDT Ventricular Rate:  99 PR Interval:  128 QRS Duration: 78 QT Interval:  366 QTC Calculation: 470 R Axis:   43 Text Interpretation:  Sinus tachycardia Borderline T wave abnormalities  borderline ST depression in inferior and  lateral leads Confirmed by  Jazzmine Kleiman MD, Rillie Riffel ([id number]) on 08/10/2015 9:00:53 AM     Medications  sodium chloride 0.9 % bolus 1,000 mL (0 mLs Intravenous Stopped 08/10/15 1322)  diphenhydrAMINE (BENADRYL) injection 25 mg (25 mg Intravenous Given 08/10/15 0955)  metoCLOPramide (REGLAN) injection 10 mg (10 mg Intravenous Given 08/10/15 0955)  magnesium sulfate IVPB 2 g 50 mL (0 g Intravenous Stopped 08/10/15 1135)    MDM   Final diagnoses:  Heart palpitations  Nonintractable migraine, unspecified migraine type   48yo F w/ h/o complex migraines and  panic attacks p/w several days of migraine unresponsive to home medications as well as heart racing sensation since last night. At presentation, patient was awake, alert, and in NAD. VS notable for mild tachycardia. Neurologic exam unremarkable although somewhat effort dependent on strength testing. Patient later seen ambulating and using arms/hands without difficulty. Obtained above lab work as well as EKG and CXR. Gave patient benadryl, compazine, and toradol.   Lab work including serial troponins unremarkable. EKG showed sinus rhythm with no ischemic changes. CXR unremarkable. On re-examination, the patient was standing in room stating that her headache was improved and she wanted to leave. Of note, earlier in ED course, the patient's brother contacted nursing staff to inform them that patient had requested detox from benzos. Patient currently  shows no evidence of withdrawal and is ambulating normally in ED. Her HEART score is </=3 and given that her chest pain accompanies panic attacks/anxiety, I feel that ACS is very unlikely. Patient denies any risk factors for blood clots (no OCP use, recent  travel, h/o cancer) thus PE also unlikely. I reviewed return precautions regarding HA and chest pain and patient voiced understanding. Also discussed smoking cessation strategies. Patient discharged in satisfactory condition.     Laurence Spates, MD 08/11/15 [phone]

## 2015-08-10 NOTE — ED Notes (Signed)
Pt reports palpitations and migraine HA since last night. Hx of anxiety and has had similar symptoms before. HR 114 in triage.

## 2015-08-11 LAB — PREGNANCY, URINE: Preg Test, Ur: NEGATIVE

## 2016-01-01 MED FILL — diazePAM 10 MG TABS: 10 | 30 days supply | Qty: 30 | Fill #0

## 2016-01-22 MED FILL — ALPRAZolam 1 MG TABS: 1 | 30 days supply | Qty: 120 | Fill #1

## 2016-01-29 MED FILL — diazePAM 10 MG TABS: 10 | 30 days supply | Qty: 30 | Fill #1

## 2016-02-04 ENCOUNTER — Telehealth: Payer: Self-pay | Admitting: Family Medicine

## 2016-02-04 DIAGNOSIS — N939 Abnormal uterine and vaginal bleeding, unspecified: Secondary | ICD-10-CM

## 2016-02-04 NOTE — Telephone Encounter (Signed)
Pt called and would like to referral to go to a OBGYN. Please call patient when this is done so she can make an appointment. jw

## 2016-02-04 NOTE — Telephone Encounter (Signed)
Spoke with patient, states she has had previous full hysterectomy and has recently started having some vaginal bleeding with cramping. Would like referral to GYN, will forward to PCP.

## 2016-02-05 DIAGNOSIS — N939 Abnormal uterine and vaginal bleeding, unspecified: Secondary | ICD-10-CM | POA: Insufficient documentation

## 2016-02-05 NOTE — Telephone Encounter (Signed)
Referral placed  Erasmo Downer, MD, MPH PGY-2,  [facility] 02/05/2016 11:40 AM

## 2016-02-07 ENCOUNTER — Emergency Department (INDEPENDENT_AMBULATORY_CARE_PROVIDER_SITE_OTHER)
Admission: EM | Admit: 2016-02-07 | Discharge: 2016-02-07 | Disposition: A | Discharge status: Home / Self Care | Payer: Medicaid Other | Source: Home / Self Care | Attending: Family Medicine | Admitting: Family Medicine

## 2016-02-07 DIAGNOSIS — J069 Acute upper respiratory infection, unspecified: Secondary | ICD-10-CM | POA: Diagnosis not present

## 2016-02-07 MED ORDER — GUAIFENESIN ER 600 MG PO TB12: 1200.0000 mg | ORAL_TABLET | Freq: Two times a day (BID) | ORAL | Status: DC

## 2016-02-07 MED ORDER — HYDROCODONE-HOMATROPINE 5-1.5 MG/5ML PO SYRP: 5.0000 mL | ORAL_SOLUTION | Freq: Four times a day (QID) | ORAL | Status: DC | PRN

## 2016-02-07 NOTE — ED Notes (Signed)
Please see providers note.  

## 2016-02-07 NOTE — Discharge Instructions (Signed)
It was a pleasure to see you today.  I believe your cough and other symptoms are from a viral respiratory infection.    For symptoms control, I am prescribing you:  Guaifenesin  by mouth twice daily (loosens mucus); Hycodan syrup, 1tsp by mouth every 6 hours as needed for cough.   Humidifier, saline nasal spray for nasal congestion and symptoms.   Tylenol as needed for aches and pains.   Follow up with Dr Richarda Blade or Dr. Beryle Flock early next week; return to [facility] if worsening or other concerns/questions.

## 2016-02-07 NOTE — ED Provider Notes (Signed)
CSN: [id number]     Arrival date & time 02/07/16  1613 History   First MD Initiated Contact with Patient 02/07/16 1635     No chief complaint on file.  (Consider location/radiation/quality/duration/timing/severity/associated sxs/prior Treatment) The history is provided by the patient. No language interpreter was used.   Presents with complaint of cough, scratchy throat, generalized malaise for past 3 days. Aunt has been ill with similar sxs for the past week.  Patient reports chills but no known fevers.  Some nausea but no vomiting. Nasal congestion noted.   Patient took hydrocodone tablets which she reports helped her cough overnight.   Social Hx; Mother died 05/01/16father died 04/27/2015. Grandfather diagnosed with lung cancer. Patient formerly smoked 2ppd, down to 5-6 cigarettes a day recently.  Past Medical History  Diagnosis Date  . Tobacco use disorder   . Generalized anxiety disorder   . Panic disorder without agoraphobia   . Abnormal maternal glucose tolerance, complicating pregnancy, childbirth, or the puerperium, unspecified as to episode of care   . Migraine     since age 85  . Acute MI 04/27/2003  . Chronic back pain   . GERD (gastroesophageal reflux disease)   . Hyperlipidemia    Past Surgical History  Procedure Laterality Date  . Rhinoplasty    . Bilateral salpingoophorectomy  12/11    by DrBernardo  . Abdominal hysterectomy  2008-04-26  . Cholecystectomy    . Tonsillectomy    . Adenoidectomy    . Cesarean section  2004, 2006   Family History  Problem Relation Age of Onset  . Coronary artery disease Father   . Heart disease Father   . Depression Father   . Anxiety disorder Father   . Hyperlipidemia Father   . Hypertension Father   . Stroke Father   . Asthma Mother   . Heart disease Mother   . Depression Mother   . Anxiety disorder Mother   . Depression Brother   . Anxiety disorder Brother   . Hypertension Brother    Social History  Substance Use Topics  .  Smoking status: Current Some Day Smoker -- 0.50 packs/day    Types: Cigarettes    Start date: 05/04/1985    Last Attempt to Quit: 08/27/2009  . Smokeless tobacco: Never Used  . Alcohol Use: No   OB History    Gravida Para Term Preterm AB TAB SAB Ectopic Multiple Living   2 2             Review of Systems  Constitutional: Positive for chills. Negative for fever and diaphoresis.  HENT: Positive for congestion, postnasal drip and sore throat.   Respiratory: Positive for cough and shortness of breath. Negative for chest tightness and wheezing.   Cardiovascular: Negative for chest pain.  All other systems reviewed and are negative.   Allergies  Imitrex; Relpax; and Topamax  Home Medications   Prior to Admission medications   Medication Sig Start Date End Date Taking? Authorizing Provider  ALPRAZolam Prudy Feeler) 1 MG tablet Take 1 mg by mouth 4 (four) times daily.     Historical Provider, MD  butalbital-acetaminophen-caffeine (FIORICET, ESGIC) 50-325-40 MG per tablet TAKE 1 TABLET BY MOUTH EVERY 8 HOURS AS NEEDED FOR MIGRAINE 08/06/15   Historical Provider, MD  diazepam (VALIUM) 10 MG tablet Take 10 mg by mouth at bedtime.    Historical Provider, MD  guaiFENesin (MUCINEX) 600 MG 12 hr tablet Take 2 tablets (1,200 mg total) by mouth 2 (  two) times daily. 02/07/16   Barbaraann Barthel, MD  HYDROcodone-acetaminophen (NORCO/VICODIN) 5-325 MG per tablet Take 1 tablet by mouth every 8 (eight) hours. 08/06/15   Historical Provider, MD  HYDROcodone-homatropine (HYCODAN) 5-1.5 MG/5ML syrup Take 5 mLs by mouth every 6 (six) hours as needed for cough. 02/07/16   Barbaraann Barthel, MD  ibuprofen (ADVIL,MOTRIN) 200 MG tablet Take 400-800 mg by mouth every 4 (four) hours as needed for mild pain or moderate pain.     Historical Provider, MD  loperamide (IMODIUM) 2 MG capsule Take 2 mg by mouth every 6 (six) hours as needed for diarrhea or loose stools.    Historical Provider, MD  methocarbamol (ROBAXIN) 500 MG tablet  Take 1 tablet (500 mg total) by mouth 2 (two) times daily. Patient not taking: Reported on 07/28/2015 06/02/15   Catha Gosselin, PA-C  Multiple Vitamin (MULTIVITAMIN WITH MINERALS) TABS tablet Take 1 tablet by mouth daily.    Historical Provider, MD  naproxen (NAPROSYN) 500 MG tablet Take 1 tablet (500 mg total) by mouth 2 (two) times daily. Patient not taking: Reported on 07/28/2015 06/02/15   Catha Gosselin, PA-C  ondansetron (ZOFRAN) 4 MG tablet Take 1 tablet (4 mg total) by mouth every 6 (six) hours. 07/28/15   Roxy Horseman, PA-C  oxyCODONE-acetaminophen (PERCOCET/ROXICET) 5-325 MG per tablet Take 2 tablets by mouth every 4 (four) hours as needed for severe pain. Patient not taking: Reported on 07/28/2015 06/02/15   Catha Gosselin, PA-C   Meds Ordered and Administered this Visit  Medications - No data to display  BP 124/84 mmHg  Pulse 97  Temp(Src) 98 F (36.7 C) (Oral)  SpO2 99% No data found.   Physical Exam  Constitutional: She appears well-developed and well-nourished. No distress.  HENT:  Head: Normocephalic and atraumatic.  Right Ear: External ear normal.  Left Ear: External ear normal.  Nose: Nose normal.  Mouth/Throat: Oropharynx is clear and moist. No oropharyngeal exudate.  No frontal or maxillary sinus tenderness.   Eyes: Conjunctivae and EOM are normal. Pupils are equal, round, and reactive to light. Right eye exhibits no discharge. Left eye exhibits discharge. No scleral icterus.  Neck: Normal range of motion. Neck supple. No thyromegaly present.  Cardiovascular: Normal rate, regular rhythm and normal heart sounds.   Pulmonary/Chest: Effort normal and breath sounds normal. No respiratory distress. She has no wheezes. She has no rales. She exhibits no tenderness.  Abdominal: Soft.  Lymphadenopathy:    She has no cervical adenopathy.  Skin: She is not diaphoretic.    ED Course  Procedures (including critical care time)  Labs Review Labs Reviewed - No data to  display  Imaging Review No results found.   Visual Acuity Review  Right Eye Distance:   Left Eye Distance:   Bilateral Distance:    Right Eye Near:   Left Eye Near:    Bilateral Near:         MDM   1. Upper respiratory infection    Patient with symptoms of upper respiratory infection, she describes as gradual onset in setting of ill family member.  No flu shot this season.  Plan for supportive therapy, cough is her most bothersome symptom.  Follow up with her primary doctor at [facility] early next week, return here if worsening.   Paula Compton, MD    Barbaraann Barthel, MD 02/07/16 [phone]

## 2016-02-19 MED FILL — ALPRAZolam 1 MG TABS: 1 | 30 days supply | Qty: 120 | Fill #2

## 2016-02-26 ENCOUNTER — Other Ambulatory Visit: Payer: Self-pay

## 2016-02-26 ENCOUNTER — Ambulatory Visit (INDEPENDENT_AMBULATORY_CARE_PROVIDER_SITE_OTHER): Payer: Medicaid Other | Admitting: Obstetrics

## 2016-02-26 ENCOUNTER — Encounter: Payer: Self-pay | Admitting: Obstetrics

## 2016-02-26 VITALS — BP 127/89 | HR 101 | Temp 98.3°F | Wt 139.0 lb

## 2016-02-26 DIAGNOSIS — R102 Pelvic and perineal pain: Secondary | ICD-10-CM

## 2016-02-26 DIAGNOSIS — Z1239 Encounter for other screening for malignant neoplasm of breast: Secondary | ICD-10-CM

## 2016-02-26 DIAGNOSIS — Z01419 Encounter for gynecological examination (general) (routine) without abnormal findings: Secondary | ICD-10-CM | POA: Diagnosis not present

## 2016-02-26 DIAGNOSIS — Z0000 Encounter for general adult medical examination without abnormal findings: Secondary | ICD-10-CM

## 2016-02-26 DIAGNOSIS — N393 Stress incontinence (female) (male): Secondary | ICD-10-CM

## 2016-02-26 DIAGNOSIS — Z1231 Encounter for screening mammogram for malignant neoplasm of breast: Secondary | ICD-10-CM

## 2016-02-26 MED FILL — diazePAM 10 MG TABS: 10 | 30 days supply | Qty: 30 | Fill #2

## 2016-02-27 ENCOUNTER — Encounter: Payer: Self-pay | Admitting: Obstetrics

## 2016-02-27 NOTE — Progress Notes (Signed)
Subjective:        [name] is a 50 y.o. female here for a routine exam.  Current complaints: Pain in lower abdomen over past 3 months.  Pain off and on and located in area of previous surgical scar for hysterectomy.  TAH done in ~ 2009.  BSO done in 2011.  Had C/S's done in 2004 and 2006.  Denies N/V, diarrhea or constipation.    Personal health questionnaire:  Is patient Ashkenazi Jewish, have a family history of breast and/or ovarian cancer: no Is there a family history of uterine cancer diagnosed at age < 48, gastrointestinal cancer, urinary tract cancer, family member who is a Personnel officer syndrome-associated carrier: no Is the patient overweight and hypertensive, family history of diabetes, personal history of gestational diabetes, preeclampsia or PCOS: no Is patient over 37, have PCOS,  family history of premature CHD under age 103, diabetes, smoke, have hypertension or peripheral artery disease:  no At any time, has a partner hit, kicked or otherwise hurt or frightened you?: no Over the past 2 weeks, have you felt down, depressed or hopeless?: no Over the past 2 weeks, have you felt little interest or pleasure in doing things?:no   Gynecologic History No LMP recorded. Patient has had a hysterectomy. Contraception: status post hysterectomy Last Pap: ~2009. Results were: normal Last mammogram: unknown. Results were: normal  Obstetric History OB History  Gravida Para Term Preterm AB SAB TAB Ectopic Multiple Living  2 2            # Outcome Date GA Lbr Len/2nd Weight Sex Delivery Anes PTL Lv  2 Para           1 Para               Past Medical History  Diagnosis Date  . Tobacco use disorder   . Generalized anxiety disorder   . Panic disorder without agoraphobia   . Abnormal maternal glucose tolerance, complicating pregnancy, childbirth, or the puerperium, unspecified as to episode of care   . Migraine     since age 14  . Acute MI (HCC) 2004  . Chronic back pain   .  GERD (gastroesophageal reflux disease)   . Hyperlipidemia     Past Surgical History  Procedure Laterality Date  . Rhinoplasty    . Bilateral salpingoophorectomy  12/11    by DrBernardo  . Abdominal hysterectomy  2009  . Cholecystectomy    . Tonsillectomy    . Adenoidectomy    . Cesarean section  2004, 2006     Current outpatient prescriptions:  .  ALPRAZolam (XANAX) 1 MG tablet, Take 1 mg by mouth 4 (four) times daily. , Disp: , Rfl:  .  diazepam (VALIUM) 10 MG tablet, Take 10 mg by mouth at bedtime., Disp: , Rfl:  .  Multiple Vitamin (MULTIVITAMIN WITH MINERALS) TABS tablet, Take 1 tablet by mouth daily., Disp: , Rfl:  .  butalbital-acetaminophen-caffeine (FIORICET, ESGIC) 50-325-40 MG per tablet, Reported on 02/26/2016, Disp: , Rfl: 0 .  HYDROcodone-homatropine (HYCODAN) 5-1.5 MG/5ML syrup, Take 5 mLs by mouth every 6 (six) hours as needed for cough. (Patient not taking: Reported on 02/26/2016), Disp: 120 mL, Rfl: 0 Allergies  Allergen Reactions  . Imitrex [Sumatriptan] Swelling    Throat and tongue swelling   . Relpax [Eletriptan] Swelling    Throat swelling and panic attack  . Topamax [Topiramate] Shortness Of Breath and Swelling    Social History  Substance Use Topics  .  Smoking status: Current Some Day Smoker -- 0.50 packs/day    Types: Cigarettes    Start date: 05/04/1985  . Smokeless tobacco: Never Used  . Alcohol Use: No    Family History  Problem Relation Age of Onset  . Coronary artery disease Father   . Heart disease Father   . Depression Father   . Anxiety disorder Father   . Hyperlipidemia Father   . Hypertension Father   . Stroke Father   . Asthma Mother   . Heart disease Mother   . Depression Mother   . Anxiety disorder Mother   . Depression Brother   . Anxiety disorder Brother   . Hypertension Brother       Review of Systems  Constitutional: negative for fatigue and weight loss Respiratory: negative for cough and wheezing Cardiovascular:  negative for chest pain, fatigue and palpitations Gastrointestinal: negative for abdominal pain and change in bowel habits Musculoskeletal:negative for myalgias Neurological: negative for gait problems and tremors Behavioral/Psych: negative for abusive relationship, depression Endocrine: negative for temperature intolerance   Genitourinary:negative for abnormal menstrual periods, genital lesions, hot flashes, sexual problems and vaginal discharge Integument/breast: negative for breast lump, breast tenderness, nipple discharge and skin lesion(s)    Objective:       BP 127/89 mmHg  Pulse 101  Temp(Src) 98.3 F (36.8 C)  Wt 139 lb (63.05 kg) General:   alert  Skin:   no rash or abnormalities  Lungs:   clear to auscultation bilaterally  Heart:   regular rate and rhythm, S1, S2 normal, no murmur, click, rub or gallop  Breasts:   normal without suspicious masses, skin or nipple changes or axillary nodes  Abdomen:  tender to palpation deep to low transverse incision.  No masses felt.  Pelvis:  External genitalia: normal general appearance Urinary system: urethral meatus normal and bladder without fullness, nontender Vaginal: normal mucosa Cervix: absent Adnexa: absent Uterus: absent   Lab Review Urine pregnancy test Labs reviewed yes Radiologic studies reviewed yes    Assessment:    Healthy female exam.    Pelvic pain.  Possible pelvic adhesions from multiple previous surgeries.    Plan:    Will probably need [facility].  Education reviewed: calcium supplements, depression evaluation, self breast exams, smoking cessation and weight bearing exercise. Mammogram ordered. Follow up in: several  months.   No orders of the defined types were placed in this encounter.   Orders Placed This Encounter  Procedures  . SureSwab, Vaginosis/Vaginitis Plus

## 2016-03-01 LAB — SURESWAB, VAGINOSIS/VAGINITIS PLUS
ATOPOBIUM VAGINAE: NOT DETECTED Log (cells/mL)
C. ALBICANS, DNA: NOT DETECTED
C. GLABRATA, DNA: NOT DETECTED
C. PARAPSILOSIS, DNA: NOT DETECTED
C. TRACHOMATIS RNA, TMA: NOT DETECTED
C. tropicalis, DNA: NOT DETECTED
Gardnerella vaginalis: NOT DETECTED Log (cells/mL)
LACTOBACILLUS SPECIES: 7.9 Log (cells/mL)
MEGASPHAERA SPECIES: NOT DETECTED Log (cells/mL)
N. gonorrhoeae RNA, TMA: NOT DETECTED
T. VAGINALIS RNA, QL TMA: NOT DETECTED

## 2016-03-12 ENCOUNTER — Ambulatory Visit: Payer: Medicaid Other

## 2016-03-15 ENCOUNTER — Emergency Department (HOSPITAL_COMMUNITY)
Admission: EM | Admit: 2016-03-15 | Discharge: 2016-03-15 | Disposition: A | Discharge status: Home / Self Care | Payer: Medicaid Other | Attending: Emergency Medicine | Admitting: Emergency Medicine

## 2016-03-15 ENCOUNTER — Encounter (HOSPITAL_COMMUNITY): Payer: Self-pay | Admitting: Emergency Medicine

## 2016-03-15 DIAGNOSIS — I252 Old myocardial infarction: Secondary | ICD-10-CM | POA: Insufficient documentation

## 2016-03-15 DIAGNOSIS — R519 Headache, unspecified: Secondary | ICD-10-CM

## 2016-03-15 DIAGNOSIS — K219 Gastro-esophageal reflux disease without esophagitis: Secondary | ICD-10-CM | POA: Diagnosis not present

## 2016-03-15 DIAGNOSIS — E785 Hyperlipidemia, unspecified: Secondary | ICD-10-CM | POA: Diagnosis not present

## 2016-03-15 DIAGNOSIS — G8929 Other chronic pain: Secondary | ICD-10-CM | POA: Diagnosis not present

## 2016-03-15 DIAGNOSIS — F17210 Nicotine dependence, cigarettes, uncomplicated: Secondary | ICD-10-CM | POA: Insufficient documentation

## 2016-03-15 DIAGNOSIS — F410 Panic disorder [episodic paroxysmal anxiety] without agoraphobia: Secondary | ICD-10-CM | POA: Insufficient documentation

## 2016-03-15 DIAGNOSIS — R51 Headache: Secondary | ICD-10-CM | POA: Diagnosis present

## 2016-03-15 MED ORDER — BUTALBITAL-APAP-CAFFEINE 50-325-40 MG PO TABS: 1.0000 | ORAL_TABLET | Freq: Four times a day (QID) | ORAL | Status: DC | PRN

## 2016-03-15 MED ORDER — MAGNESIUM SULFATE 2 GM/50ML IV SOLN
2.0000 g | Freq: Once | INTRAVENOUS | Status: AC
  Administered 2016-03-15: 2 g via INTRAVENOUS
  Filled 2016-03-15: qty 50

## 2016-03-15 MED ORDER — KETOROLAC TROMETHAMINE 30 MG/ML IJ SOLN
30.0000 mg | Freq: Once | INTRAMUSCULAR | Status: AC
  Administered 2016-03-15: 30 mg via INTRAVENOUS
  Filled 2016-03-15: qty 1

## 2016-03-15 MED ORDER — METHYLPREDNISOLONE SODIUM SUCC 125 MG IJ SOLR
125.0000 mg | Freq: Once | INTRAMUSCULAR | Status: AC
  Administered 2016-03-15: 125 mg via INTRAVENOUS
  Filled 2016-03-15: qty 2

## 2016-03-15 MED ORDER — PROCHLORPERAZINE EDISYLATE 5 MG/ML IJ SOLN
10.0000 mg | Freq: Once | INTRAMUSCULAR | Status: AC
  Administered 2016-03-15: 10 mg via INTRAVENOUS
  Filled 2016-03-15: qty 2

## 2016-03-15 NOTE — ED Provider Notes (Signed)
CSN: [id number]     Arrival date & time 03/15/16  1039 History   First MD Initiated Contact with Patient 03/15/16 1218     Chief Complaint  Patient presents with  . Migraine     (Consider location/radiation/quality/duration/timing/severity/associated sxs/prior Treatment) HPI   Blood pressure 117/79, pulse 96, temperature 98.2 F (36.8 C), temperature source Oral, resp. rate 20, SpO2 100 %.  [name] is a 50 y.o. female complaining of right-sided periorbital headache consistent with prior migraine exacerbations onset 4 days ago intermittently but becoming more constant today. No pain medication taken prior to arrival she states that she has difficult to treat headaches. Associated symptoms of 2 episodes of nonbloody, nonbilious, coffee-ground emesis with photophobia and phonophobia. Patient states that the pain is typical for her prior headache exacerbations, pain radiates down the lateral right side of the neck. She denies change in vision, fever, chills, dysarthria, ataxia, chest pain, abdominal pain. Patient states she should've been in court this morning is requesting that we fax a note to the courthouse.  Past Medical History  Diagnosis Date  . Tobacco use disorder   . Generalized anxiety disorder   . Panic disorder without agoraphobia   . Abnormal maternal glucose tolerance, complicating pregnancy, childbirth, or the puerperium, unspecified as to episode of care   . Migraine     since age 40  . Acute MI (HCC) 2004  . Chronic back pain   . GERD (gastroesophageal reflux disease)   . Hyperlipidemia    Past Surgical History  Procedure Laterality Date  . Rhinoplasty    . Bilateral salpingoophorectomy  12/11    by DrBernardo  . Abdominal hysterectomy  2009  . Cholecystectomy    . Tonsillectomy    . Adenoidectomy    . Cesarean section  2004, 2006   Family History  Problem Relation Age of Onset  . Coronary artery disease Father   . Heart disease Father   . Depression  Father   . Anxiety disorder Father   . Hyperlipidemia Father   . Hypertension Father   . Stroke Father   . Asthma Mother   . Heart disease Mother   . Depression Mother   . Anxiety disorder Mother   . Depression Brother   . Anxiety disorder Brother   . Hypertension Brother    Social History  Substance Use Topics  . Smoking status: Current Some Day Smoker -- 0.50 packs/day    Types: Cigarettes    Start date: 05/04/1985  . Smokeless tobacco: Never Used  . Alcohol Use: No   OB History    Gravida Para Term Preterm AB TAB SAB Ectopic Multiple Living   2 2             Review of Systems  10 systems reviewed and found to be negative, except as noted in the HPI.   Allergies  Imitrex; Relpax; and Topamax  Home Medications   Prior to Admission medications   Medication Sig Start Date End Date Taking? Authorizing Provider  acetaminophen (TYLENOL) 500 MG tablet Take 1,000 mg by mouth every 6 (six) hours as needed for moderate pain or headache.   Yes Historical Provider, MD  ALPRAZolam Prudy Feeler) 1 MG tablet Take 1 mg by mouth 4 (four) times daily.    Yes Historical Provider, MD  calcium carbonate (TUMS - DOSED IN MG ELEMENTAL CALCIUM) 500 MG chewable tablet Chew 2 tablets by mouth daily as needed for indigestion or heartburn.   Yes Historical Provider,  MD  Cholecalciferol (VITAMIN D PO) Take 1 capsule by mouth daily.   Yes Historical Provider, MD  diazepam (VALIUM) 10 MG tablet Take 10 mg by mouth at bedtime.   Yes Historical Provider, MD  Multiple Vitamin (MULTIVITAMIN WITH MINERALS) TABS tablet Take 1 tablet by mouth daily.   Yes Historical Provider, MD  VITAMIN E PO Take 1 tablet by mouth daily.   Yes Historical Provider, MD  butalbital-acetaminophen-caffeine (FIORICET) 50-325-40 MG tablet Take 1 tablet by mouth every 6 (six) hours as needed for headache. 03/15/16   Joni Reining Kyllian Clingerman, PA-C  HYDROcodone-homatropine (HYCODAN) 5-1.5 MG/5ML syrup Take 5 mLs by mouth every 6 (six) hours as  needed for cough. Patient not taking: Reported on 02/26/2016 02/07/16   Barbaraann Barthel, MD   BP 131/76 mmHg  Pulse 97  Temp(Src) 97.7 F (36.5 C) (Oral)  Resp 18  SpO2 98% Physical Exam  Constitutional: She is oriented to person, place, and time. She appears well-developed and well-nourished.  HENT:  Head: Normocephalic and atraumatic.  Mouth/Throat: Oropharynx is clear and moist.  Eyes: Conjunctivae and EOM are normal. Pupils are equal, round, and reactive to light.  No TTP of maxillary or frontal sinuses  No TTP or induration of temporal arteries bilaterally  Neck: Normal range of motion. Neck supple.  FROM to C-spine. Pt can touch chin to chest without discomfort. No TTP of midline cervical spine.   Cardiovascular: Normal rate, regular rhythm and intact distal pulses.   Pulmonary/Chest: Effort normal and breath sounds normal. No respiratory distress. She has no wheezes. She has no rales. She exhibits no tenderness.  Abdominal: Soft. Bowel sounds are normal. There is no tenderness.  Musculoskeletal: Normal range of motion. She exhibits no edema or tenderness.  Neurological: She is alert and oriented to person, place, and time. No cranial nerve deficit.  II-Visual fields grossly intact. III/IV/VI-Extraocular movements intact.  Pupils reactive bilaterally. V/VII-Smile symmetric, equal eyebrow raise,  facial sensation intact VIII- Hearing grossly intact IX/X-Normal gag XI-bilateral shoulder shrug XII-midline tongue extension Motor: 5/5 bilaterally with normal tone and bulk Cerebellar: Normal finger-to-nose  and normal heel-to-shin test.   Romberg negative Ambulates with a coordinated gait   Nursing note and vitals reviewed.   ED Course  Procedures (including critical care time) Labs Review Labs Reviewed - No data to display  Imaging Review No results found. I have personally reviewed and evaluated these images and lab results as part of my medical decision-making.   EKG  Interpretation None      MDM   Final diagnoses:  Nonintractable headache, unspecified chronicity pattern, unspecified headache type    Filed Vitals:   03/15/16 1101 03/15/16 1201 03/15/16 1406  BP: 118/85 117/79 131/76  Pulse: 102 96 97  Temp: 98.1 F (36.7 C) 98.2 F (36.8 C) 97.7 F (36.5 C)  TempSrc: Oral Oral Oral  Resp: SpO2: 97% 100% 98%    Medications  prochlorperazine (COMPAZINE) injection 10 mg (10 mg Intravenous Given 03/15/16 1235)  ketorolac (TORADOL) 30 MG/ML injection 30 mg (30 mg Intravenous Given 03/15/16 1235)  magnesium sulfate IVPB 2 g 50 mL (2 g Intravenous New Bag/Given 03/15/16 1235)  methylPREDNISolone sodium succinate (SOLU-MEDROL) 125 mg/2 mL injection 125 mg (125 mg Intravenous Given 03/15/16 1235)    [name] is 50 y.o. female presenting with Migraine headache exacerbation. Neuro exam is nonfocal. HA. Presentation inon concerning for [facility], ICH, Meningitis, or temporal arteritis. Pt is afebrile with no focal neuro deficits, nuchal  rigidity, or change in vision. Pt is to follow up with PCP to discuss prophylactic medication. Pt verbalizes understanding and is agreeable with plan to dc.  Evaluation does not show pathology that would require ongoing emergent intervention or inpatient treatment. Pt is hemodynamically stable and mentating appropriately. Discussed findings and plan with patient/guardian, who agrees with care plan. All questions answered. Return precautions discussed and outpatient follow up given.   New Prescriptions   BUTALBITAL-ACETAMINOPHEN-CAFFEINE (FIORICET) 50-325-40 MG TABLET    Take 1 tablet by mouth every 6 (six) hours as needed for headache.         Wynetta Emeryicole Eshan Trupiano, PA-C 03/15/16 1450  Benjiman CoreNathan Pickering, MD 03/15/16 [id number]

## 2016-03-15 NOTE — ED Notes (Addendum)
Pt c/o migraine since 4 am this morning. Pt is usually able to handle the pain but she sts this one is worse than usual. Pt c/o vomiting. Denies dizziness. Pt sts she was supposed to be in court this morning and instead she is here so she needs someone to fax court a note on her behalf. A&Ox4 and ambulatory. C/o light and sound sensitivity. Pt sts she is allergic to all migraine medications.

## 2016-03-15 NOTE — Discharge Instructions (Signed)
Please follow with your primary care doctor in the next 2 days for a check-up. They must obtain records for further management.  ° °Do not hesitate to return to the Emergency Department for any new, worsening or concerning symptoms.  ° ° °General Headache Without Cause °A headache is pain or discomfort felt around the head or neck area. There are many causes and types of headaches. In some cases, the cause may not be found.  °HOME CARE  °Managing Pain °· Take over-the-counter and prescription medicines only as told by your doctor. °· Lie down in a dark, quiet room when you have a headache. °· If directed, apply ice to the head and neck area: °¨ Put ice in a plastic bag. °¨ Place a towel between your skin and the bag. °¨ Leave the ice on for 20 minutes, 2-3 times per day. °· Use a heating pad or hot shower to apply heat to the head and neck area as told by your doctor. °· Keep lights dim if bright lights bother you or make your headaches worse. °Eating and Drinking °· Eat meals on a regular schedule. °· Lessen how much alcohol you drink. °· Lessen how much caffeine you drink, or stop drinking caffeine. °General Instructions °· Keep all follow-up visits as told by your doctor. This is important. °· Keep a journal to find out if certain things bring on headaches. For example, write down: °¨ What you eat and drink. °¨ How much sleep you get. °¨ Any change to your diet or medicines. °· Relax by getting a massage or doing other relaxing activities. °· Lessen stress. °· Sit up straight. Do not tighten (tense) your muscles. °· Do not use tobacco products. This includes cigarettes, chewing tobacco, or e-cigarettes. If you need help quitting, ask your doctor. °· Exercise regularly as told by your doctor. °· Get enough sleep. This often means 7-9 hours of sleep. °GET HELP IF: °· Your symptoms are not helped by medicine. °· You have a headache that feels different than the other headaches. °· You feel sick to your stomach  (nauseous) or you throw up (vomit). °· You have a fever. °GET HELP RIGHT AWAY IF:  °· Your headache becomes really bad. °· You keep throwing up. °· You have a stiff neck. °· You have trouble seeing. °· You have trouble speaking. °· You have pain in the eye or ear. °· Your muscles are weak or you lose muscle control. °· You lose your balance or have trouble walking. °· You feel like you will pass out (faint) or you pass out. °· You have confusion. °  °This information is not intended to replace advice given to you by your health care provider. Make sure you discuss any questions you have with your health care provider. °  °Document Released: 09/21/2008 Document Revised: 09/03/2015 Document Reviewed: 04/07/2015 °Elsevier Interactive Patient Education ©2016 [employer] ° °

## 2016-03-19 ENCOUNTER — Ambulatory Visit (INDEPENDENT_AMBULATORY_CARE_PROVIDER_SITE_OTHER): Payer: Self-pay | Admitting: Physician Assistant

## 2016-03-19 ENCOUNTER — Ambulatory Visit (INDEPENDENT_AMBULATORY_CARE_PROVIDER_SITE_OTHER): Payer: Self-pay

## 2016-03-19 VITALS — BP 128/82 | HR 108 | Temp 98.1°F | Resp 17 | Ht 63.5 in | Wt 133.0 lb

## 2016-03-19 DIAGNOSIS — R6889 Other general symptoms and signs: Secondary | ICD-10-CM

## 2016-03-19 DIAGNOSIS — R059 Cough, unspecified: Secondary | ICD-10-CM

## 2016-03-19 DIAGNOSIS — R062 Wheezing: Secondary | ICD-10-CM

## 2016-03-19 DIAGNOSIS — R4781 Slurred speech: Secondary | ICD-10-CM

## 2016-03-19 DIAGNOSIS — R509 Fever, unspecified: Secondary | ICD-10-CM

## 2016-03-19 DIAGNOSIS — M62838 Other muscle spasm: Secondary | ICD-10-CM

## 2016-03-19 DIAGNOSIS — R05 Cough: Secondary | ICD-10-CM

## 2016-03-19 DIAGNOSIS — R0602 Shortness of breath: Secondary | ICD-10-CM

## 2016-03-19 LAB — POCT CBC
Granulocyte percent: 59.4 %G (ref 37–80)
HCT, POC: 42.9 % (ref 37.7–47.9)
HEMOGLOBIN: 14.9 g/dL (ref 12.2–16.2)
LYMPH, POC: 2.1 (ref 0.6–3.4)
MCH: 31.9 pg — AB (ref 27–31.2)
MCHC: 34.8 g/dL (ref 31.8–35.4)
MCV: 91.7 fL (ref 80–97)
MID (cbc): 0.4 (ref 0–0.9)
MPV: 6.7 fL (ref 0–99.8)
PLATELET COUNT, POC: 201 10*3/uL (ref 142–424)
POC Granulocyte: 3.6 (ref 2–6.9)
POC LYMPH PERCENT: 34.4 %L (ref 10–50)
POC MID %: 6.2 %M (ref 0–12)
RBC: 4.68 M/uL (ref 4.04–5.48)
RDW, POC: 13.9 %
WBC: 6.1 10*3/uL (ref 4.6–10.2)

## 2016-03-19 LAB — POCT INFLUENZA A/B
Influenza A, POC: NEGATIVE
Influenza B, POC: NEGATIVE

## 2016-03-19 LAB — GLUCOSE, POCT (MANUAL RESULT ENTRY): POC GLUCOSE: 89 mg/dL (ref 70–99)

## 2016-03-19 MED ORDER — METAXALONE 800 MG PO TABS: 400.0000 mg | ORAL_TABLET | Freq: Three times a day (TID) | ORAL | Status: DC

## 2016-03-19 MED ORDER — BENZONATATE 100 MG PO CAPS: ORAL_CAPSULE | ORAL | Status: AC

## 2016-03-19 MED ORDER — DICLOFENAC SODIUM 75 MG PO TBEC: 75.0000 mg | DELAYED_RELEASE_TABLET | Freq: Two times a day (BID) | ORAL | Status: DC

## 2016-03-19 MED ORDER — ALBUTEROL SULFATE (2.5 MG/3ML) 0.083% IN NEBU
2.5000 mg | INHALATION_SOLUTION | Freq: Once | RESPIRATORY_TRACT | Status: AC
  Administered 2016-03-19: 2.5 mg via RESPIRATORY_TRACT

## 2016-03-19 MED ORDER — LEVALBUTEROL HCL 0.63 MG/3ML IN NEBU: 0.6300 mg | INHALATION_SOLUTION | Freq: Once | RESPIRATORY_TRACT | Status: DC

## 2016-03-19 MED FILL — DICLOFENAC SOD EC 75 MG TAB: 75 | 15 days supply | Qty: 30 | Fill #0

## 2016-03-19 MED FILL — BENZONATATE 100 MG CAPSULE: 100 | 6 days supply | Qty: 40 | Fill #0

## 2016-03-19 MED FILL — ALPRAZolam 1 MG TABS: 1 | 30 days supply | Qty: 120 | Fill #3
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
## 2016-03-19 NOTE — Patient Instructions (Signed)
F/u with PCP early next week.  If your mental status does not improve - or gets worse you need to go to the ED  No more OTC medications for cold preps  IF you received an x-ray today, you will receive an invoice from [facility]. Please contact [facility] at [phone] with questions or concerns regarding your invoice.   IF you received labwork today, you will receive an invoice from [facility]. Please contact [facility] at [phone] with questions or concerns regarding your invoice.   Our billing staff will not be able to assist you with questions regarding bills from these companies.  You will be contacted with the lab results as soon as they are available. The fastest way to get your results is to activate your My Chart account. Instructions are located on the last page of this paperwork. If you have not heard from us regarding the results in 2 weeks, please contact this office.
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
## 2016-03-20 ENCOUNTER — Encounter: Payer: Self-pay | Admitting: Physician Assistant

## 2016-03-20 LAB — COMPLETE METABOLIC PANEL WITH GFR
ALBUMIN: 4.5 g/dL (ref 3.6–5.1)
ALK PHOS: 110 U/L (ref 33–115)
ALT: 36 U/L — ABNORMAL HIGH (ref 6–29)
AST: 55 U/L — ABNORMAL HIGH (ref 10–35)
BUN: 8 mg/dL (ref 7–25)
CALCIUM: 8.8 mg/dL (ref 8.6–10.2)
CHLORIDE: 101 mmol/L (ref 98–110)
CO2: 23 mmol/L (ref 20–31)
Creat: 0.83 mg/dL (ref 0.50–1.10)
GFR, EST NON AFRICAN AMERICAN: 83 mL/min (ref >=60)
Glucose, Bld: 81 mg/dL (ref 65–99)
POTASSIUM: 4.7 mmol/L (ref 3.5–5.3)
Sodium: 140 mmol/L (ref 135–146)
Total Bilirubin: 0.4 mg/dL (ref 0.2–1.2)
Total Protein: 7 g/dL (ref 6.1–8.1)

## 2016-03-25 MED FILL — diazePAM 10 MG TABS: 10 | 30 days supply | Qty: 30 | Fill #3

## 2016-04-20 MED FILL — traZODone HCL 100 MG TABS: 100 | 30 days supply | Qty: 60 | Fill #1

## 2016-04-23 MED FILL — ALPRAZolam 1 MG TABS: 1 | 30 days supply | Qty: 120 | Fill #0

## 2016-05-05 MED FILL — diazePAM 10 MG TABS: 10 | 30 days supply | Qty: 30 | Fill #0

## 2016-05-25 MED FILL — ALPRAZolam 1 MG TABS: 1 | 30 days supply | Qty: 120 | Fill #1

## 2016-06-03 ENCOUNTER — Emergency Department (HOSPITAL_COMMUNITY)
Admission: EM | Admit: 2016-06-03 | Discharge: 2016-06-03 | Disposition: A | Discharge status: Home / Self Care | Payer: Medicaid Other | Attending: Emergency Medicine | Admitting: Emergency Medicine

## 2016-06-03 ENCOUNTER — Encounter (HOSPITAL_COMMUNITY): Payer: Self-pay | Admitting: Emergency Medicine

## 2016-06-03 DIAGNOSIS — R51 Headache: Secondary | ICD-10-CM | POA: Diagnosis not present

## 2016-06-03 DIAGNOSIS — F17210 Nicotine dependence, cigarettes, uncomplicated: Secondary | ICD-10-CM | POA: Insufficient documentation

## 2016-06-03 DIAGNOSIS — R519 Headache, unspecified: Secondary | ICD-10-CM

## 2016-06-03 DIAGNOSIS — I252 Old myocardial infarction: Secondary | ICD-10-CM | POA: Diagnosis not present

## 2016-06-03 MED ORDER — BUTALBITAL-APAP-CAFFEINE 50-325-40 MG PO TABS: 1.0000 | ORAL_TABLET | Freq: Four times a day (QID) | ORAL | Status: AC | PRN

## 2016-06-03 MED ORDER — BUTALBITAL-APAP-CAFFEINE 50-325-40 MG PO TABS
1.0000 | ORAL_TABLET | Freq: Once | ORAL | Status: AC
  Administered 2016-06-03: 1 via ORAL
  Filled 2016-06-03: qty 1

## 2016-06-03 MED FILL — diazePAM 10 MG TABS: 10 | 30 days supply | Qty: 30 | Fill #1

## 2016-06-03 NOTE — Discharge Instructions (Signed)

## 2016-06-03 NOTE — ED Notes (Signed)
Pt reports migraine pain. Also asking for prescription for butanetol, a migraine preventative medication she was prescribed before in the ED

## 2016-06-03 NOTE — ED Provider Notes (Signed)
CSN: [id number]     Arrival date & time 06/03/16  1240 History   First MD Initiated Contact with Patient 06/03/16 1323     Chief Complaint  Patient presents with  . Migraine     (Consider location/radiation/quality/duration/timing/severity/associated sxs/prior Treatment) Patient is a 50 y.o. female presenting with migraines. The history is provided by the patient.  Migraine This is a recurrent problem. The current episode started 2 days ago. The problem occurs constantly. The problem has not changed since onset.Associated symptoms include headaches. Pertinent negatives include no chest pain, no abdominal pain and no shortness of breath. Nothing aggravates the symptoms. Nothing relieves the symptoms. She has tried nothing for the symptoms. The treatment provided no relief.   [age] yoF With a chief complaint of a headache. This feels like her typical migraines. Slow in onset started couple days ago. Slowly worsening throughout the day. Tried her home therapies without relief. Patient is waiting to see a new neurologist in about a month. She talked with her family physician who felt that it would be okay to come to the ED to get a prescription of Fioricet. She would use this as her breakthrough medication. She denies any neuro deficits.  Past Medical History  Diagnosis Date  . Tobacco use disorder   . Generalized anxiety disorder   . Panic disorder without agoraphobia   . Abnormal maternal glucose tolerance, complicating pregnancy, childbirth, or the puerperium, unspecified as to episode of care   . Migraine     since age 89  . Acute MI (HCC) 2004  . Chronic back pain   . GERD (gastroesophageal reflux disease)   . Hyperlipidemia    Past Surgical History  Procedure Laterality Date  . Rhinoplasty    . Bilateral salpingoophorectomy  12/11    by DrBernardo  . Abdominal hysterectomy  2009  . Cholecystectomy    . Tonsillectomy    . Adenoidectomy    . Cesarean section  2004, 2006   Family  History  Problem Relation Age of Onset  . Coronary artery disease Father   . Heart disease Father   . Depression Father   . Anxiety disorder Father   . Hyperlipidemia Father   . Hypertension Father   . Stroke Father   . Asthma Mother   . Heart disease Mother   . Depression Mother   . Anxiety disorder Mother   . Depression Brother   . Anxiety disorder Brother   . Hypertension Brother    Social History  Substance Use Topics  . Smoking status: Current Some Day Smoker -- 1.00 packs/day for 35 years    Types: Cigarettes  . Smokeless tobacco: Never Used  . Alcohol Use: No   OB History    Gravida Para Term Preterm AB TAB SAB Ectopic Multiple Living   2 2             Review of Systems  Constitutional: Negative for fever and chills.  HENT: Negative for congestion and rhinorrhea.   Eyes: Negative for redness and visual disturbance.  Respiratory: Negative for shortness of breath and wheezing.   Cardiovascular: Negative for chest pain and palpitations.  Gastrointestinal: Negative for nausea, vomiting and abdominal pain.  Genitourinary: Negative for dysuria and urgency.  Musculoskeletal: Negative for myalgias and arthralgias.  Skin: Negative for pallor and wound.  Neurological: Positive for headaches. Negative for dizziness.      Allergies  Imitrex; Relpax; Topamax; and Mucinex  Home Medications   Prior to Admission medications  Medication Sig Start Date End Date Taking? Authorizing Provider  acetaminophen (TYLENOL) 500 MG tablet Take 1,000 mg by mouth every 6 (six) hours as needed for moderate pain or headache.    Historical Provider, MD  ALPRAZolam Prudy Feeler) 1 MG tablet Take 1 mg by mouth 4 (four) times daily.     Historical Provider, MD  butalbital-acetaminophen-caffeine (FIORICET) 50-325-40 MG tablet Take 1 tablet by mouth every 6 (six) hours as needed for headache. 03/15/16   Joni Reining Pisciotta, PA-C  butalbital-acetaminophen-caffeine (FIORICET) 50-325-40 MG tablet Take 1-2  tablets by mouth every 6 (six) hours as needed for headache. 06/03/16 06/03/17  Melene Plan, DO  calcium carbonate (TUMS - DOSED IN MG ELEMENTAL CALCIUM) 500 MG chewable tablet Chew 2 tablets by mouth daily as needed for indigestion or heartburn. Reported on 03/19/2016    Historical Provider, MD  Cholecalciferol (VITAMIN D PO) Take 1 capsule by mouth daily. Reported on 03/19/2016    Historical Provider, MD  diazepam (VALIUM) 10 MG tablet Take 10 mg by mouth at bedtime.    Historical Provider, MD  diclofenac (VOLTAREN) 75 MG EC tablet Take 1 tablet (75 mg total) by mouth 2 (two) times daily. 03/19/16   Morrell Riddle, PA-C  HYDROcodone-homatropine (HYCODAN) 5-1.5 MG/5ML syrup Take 5 mLs by mouth every 6 (six) hours as needed for cough. Patient not taking: Reported on 02/26/2016 02/07/16   Barbaraann Barthel, MD  levalbuterol Pauline Aus) 0.63 MG/3ML nebulizer solution Take 3 mLs (0.63 mg total) by nebulization once. 03/19/16   Morrell Riddle, PA-C  metaxalone (SKELAXIN) 800 MG tablet Take 0.5-1 tablets (400-800 mg total) by mouth 3 (three) times daily. 03/19/16   Morrell Riddle, PA-C  Multiple Vitamin (MULTIVITAMIN WITH MINERALS) TABS tablet Take 1 tablet by mouth daily. Reported on 03/19/2016    Historical Provider, MD  VITAMIN E PO Take 1 tablet by mouth daily. Reported on 03/19/2016    Historical Provider, MD   BP 128/102 mmHg  Pulse 102  Temp(Src) 98.4 F (36.9 C) (Oral)  Resp 16  SpO2 98% Physical Exam  Constitutional: She is oriented to person, place, and time. She appears well-developed and well-nourished. No distress.  HENT:  Head: Normocephalic and atraumatic.  Right ptosis chronic per patient  Eyes: EOM are normal. Pupils are equal, round, and reactive to light.  Neck: Normal range of motion. Neck supple.  Cardiovascular: Normal rate and regular rhythm.  Exam reveals no gallop and no friction rub.   No murmur heard. Pulmonary/Chest: Effort normal. She has no wheezes. She has no rales.  Abdominal: Soft. She  exhibits no distension. There is no tenderness. There is no rebound and no guarding.  Musculoskeletal: She exhibits no edema or tenderness.  Neurological: She is alert and oriented to person, place, and time. She has normal strength. No cranial nerve deficit or sensory deficit. Coordination and gait normal. GCS eye subscore is 4. GCS verbal subscore is 5. GCS motor subscore is 6.  Benign neuro exam  Skin: Skin is warm and dry. She is not diaphoretic.  Psychiatric: She has a normal mood and affect. Her behavior is normal.  Nursing note and vitals reviewed.   ED Course  Procedures (including critical care time) Labs Review Labs Reviewed - No data to display  Imaging Review No results found. I have personally reviewed and evaluated these images and lab results as part of my medical decision-making.   EKG Interpretation None      MDM   Final diagnoses:  Acute  nonintractable headache, unspecified headache type    50 yo F with a chief complaint of a headache. Patient declining therapy here stating that she needs to pick up her daughter at school.  2:28 PM:  I have discussed the diagnosis/risks/treatment options with the patient and family and believe the pt to be eligible for discharge home to follow-up with PCP. We also discussed returning to the ED immediately if new or worsening sx occur. We discussed the sx which are most concerning (e.g., sudden worsening pain, fever, inability to tolerate by mouth) that necessitate immediate return. Medications administered to the patient during their visit and any new prescriptions provided to the patient are listed below.  Medications given during this visit Medications  butalbital-acetaminophen-caffeine (FIORICET, ESGIC) 50-325-40 MG per tablet 1 tablet (1 tablet Oral Given 06/03/16 1421)    Discharge Medication List as of 06/03/2016  1:35 PM    START taking these medications   Details  !! butalbital-acetaminophen-caffeine (FIORICET)  50-325-40 MG tablet Take 1-2 tablets by mouth every 6 (six) hours as needed for headache., Starting 06/03/2016, Until Fri 06/03/17, Print     !! - Potential duplicate medications found. Please discuss with provider.      The patient appears reasonably screen and/or stabilized for discharge and I doubt any other medical condition or other [facility] requiring further screening, evaluation, or treatment in the ED at this time prior to discharge.       Melene Plan, DO 06/03/16 1429

## 2016-06-22 MED FILL — ALPRAZolam 1 MG TABS: 1 | 90 days supply | Qty: 360 | Fill #0

## 2016-06-24 MED FILL — BACLOFEN 20 MG TABLET: 20 | 30 days supply | Qty: 90 | Fill #0

## 2016-07-06 ENCOUNTER — Emergency Department (HOSPITAL_COMMUNITY): Payer: Medicaid Other

## 2016-07-06 ENCOUNTER — Emergency Department (HOSPITAL_COMMUNITY)
Admission: EM | Admit: 2016-07-06 | Discharge: 2016-07-06 | Disposition: A | Discharge status: Home / Self Care | Payer: Medicaid Other | Attending: Emergency Medicine | Admitting: Emergency Medicine

## 2016-07-06 ENCOUNTER — Encounter (HOSPITAL_COMMUNITY): Payer: Self-pay

## 2016-07-06 DIAGNOSIS — E785 Hyperlipidemia, unspecified: Secondary | ICD-10-CM | POA: Insufficient documentation

## 2016-07-06 DIAGNOSIS — I252 Old myocardial infarction: Secondary | ICD-10-CM | POA: Diagnosis not present

## 2016-07-06 DIAGNOSIS — R0781 Pleurodynia: Secondary | ICD-10-CM | POA: Insufficient documentation

## 2016-07-06 DIAGNOSIS — K5900 Constipation, unspecified: Secondary | ICD-10-CM | POA: Diagnosis not present

## 2016-07-06 DIAGNOSIS — Y939 Activity, unspecified: Secondary | ICD-10-CM | POA: Insufficient documentation

## 2016-07-06 DIAGNOSIS — W01198A Fall on same level from slipping, tripping and stumbling with subsequent striking against other object, initial encounter: Secondary | ICD-10-CM | POA: Diagnosis not present

## 2016-07-06 DIAGNOSIS — R61 Generalized hyperhidrosis: Secondary | ICD-10-CM | POA: Insufficient documentation

## 2016-07-06 DIAGNOSIS — Z79899 Other long term (current) drug therapy: Secondary | ICD-10-CM | POA: Insufficient documentation

## 2016-07-06 DIAGNOSIS — R112 Nausea with vomiting, unspecified: Secondary | ICD-10-CM | POA: Diagnosis not present

## 2016-07-06 DIAGNOSIS — F17210 Nicotine dependence, cigarettes, uncomplicated: Secondary | ICD-10-CM | POA: Insufficient documentation

## 2016-07-06 DIAGNOSIS — R52 Pain, unspecified: Secondary | ICD-10-CM

## 2016-07-06 DIAGNOSIS — R0602 Shortness of breath: Secondary | ICD-10-CM | POA: Diagnosis present

## 2016-07-06 DIAGNOSIS — Y929 Unspecified place or not applicable: Secondary | ICD-10-CM | POA: Diagnosis not present

## 2016-07-06 DIAGNOSIS — Y999 Unspecified external cause status: Secondary | ICD-10-CM | POA: Insufficient documentation

## 2016-07-06 LAB — LIPASE, BLOOD: LIPASE: 55 U/L — AB (ref 11–51)

## 2016-07-06 LAB — CBC WITH DIFFERENTIAL/PLATELET
Basophils Absolute: 0 10*3/uL (ref 0.0–0.1)
Basophils Relative: 1 %
EOS ABS: 0.2 10*3/uL (ref 0.0–0.7)
Eosinophils Relative: 2 %
HEMATOCRIT: 37.1 % (ref 36.0–46.0)
HEMOGLOBIN: 12.4 g/dL (ref 12.0–15.0)
LYMPHS ABS: 2.3 10*3/uL (ref 0.7–4.0)
Lymphocytes Relative: 32 %
MCH: 32.3 pg (ref 26.0–34.0)
MCHC: 33.4 g/dL (ref 30.0–36.0)
MCV: 96.6 fL (ref 78.0–100.0)
MONOS PCT: 7 %
Monocytes Absolute: 0.5 10*3/uL (ref 0.1–1.0)
NEUTROS PCT: 58 %
Neutro Abs: 4.2 10*3/uL (ref 1.7–7.7)
Platelets: 360 10*3/uL (ref 150–400)
RBC: 3.84 MIL/uL — ABNORMAL LOW (ref 3.87–5.11)
RDW: 13.4 % (ref 11.5–15.5)
WBC: 7.1 10*3/uL (ref 4.0–10.5)

## 2016-07-06 LAB — TROPONIN I

## 2016-07-06 LAB — COMPREHENSIVE METABOLIC PANEL
ALBUMIN: 4.3 g/dL (ref 3.5–5.0)
ALT: 18 U/L (ref 14–54)
AST: 18 U/L (ref 15–41)
Alkaline Phosphatase: 97 U/L (ref 38–126)
Anion gap: 6 (ref 5–15)
BUN: 9 mg/dL (ref 6–20)
CHLORIDE: 108 mmol/L (ref 101–111)
CO2: 26 mmol/L (ref 22–32)
CREATININE: 0.53 mg/dL (ref 0.44–1.00)
Calcium: 9.3 mg/dL (ref 8.9–10.3)
GFR calc non Af Amer: >60 mL/min (ref >60)
GLUCOSE: 109 mg/dL — AB (ref 65–99)
Potassium: 3.6 mmol/L (ref 3.5–5.1)
SODIUM: 140 mmol/L (ref 135–145)
Total Bilirubin: 0.2 mg/dL — ABNORMAL LOW (ref 0.3–1.2)
Total Protein: 7.5 g/dL (ref 6.5–8.1)
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
## 2016-07-06 NOTE — Discharge Instructions (Signed)
Read the information below.   Your labs and imaging were re-assuring. I encourage you to practice symptomatic management for your pain. You can take tylenol or ibuprofen as needed for pain relief.  Follow up with your primary care provider in the next 5 days if your symptoms do not improve.  You may return to the Emergency Department at any time for worsening condition or any new symptoms that concern you. Return to ED if your symptoms worsen or you develop new symptoms or you have fever, chest pain, shortness of breath, dizziness, loss of consciousness, unilateral leg swelling, or weakness/numbness.    Pain Without a Known Cause WHAT IS PAIN WITHOUT A KNOWN CAUSE? Pain can occur in any part of the body and can range from mild to severe. Sometimes no cause can be found for why you are having pain. Some types of pain that can occur without a known cause include:   Headache.  Back pain.  Abdominal pain.  Neck pain. HOW IS PAIN WITHOUT A KNOWN CAUSE DIAGNOSED?  Your health care provider will try to find the cause of your pain. This may include:  Physical exam.  Medical history.  Blood tests.  Urine tests.  X-rays. If no cause is found, your health care provider may diagnose you with pain without a known cause.  IS THERE TREATMENT FOR PAIN WITHOUT A CAUSE?  Treatment depends on the kind of pain you have. Your health care provider may prescribe medicines to help relieve your pain.  WHAT CAN I DO AT HOME FOR MY PAIN?   Take medicines only as directed by your health care provider.  Stop any activities that cause pain. During periods of severe pain, bed rest may help.  Try to reduce your stress with activities such as yoga or meditation. Talk to your health care provider for other stress-reducing activity recommendations.  Exercise regularly, if approved by your health care provider.  Eat a healthy diet that includes fruits and vegetables. This may improve pain. Talk to your health  care provider if you have any questions about your diet. WHAT IF MY PAIN DOES NOT GET BETTER?  If you have a painful condition and no reason can be found for the pain or the pain gets worse, it is important to follow up with your health care provider. It may be necessary to repeat tests and look further for a possible cause.    This information is not intended to replace advice given to you by your health care provider. Make sure you discuss any questions you have with your health care provider.   Document Released: 09/07/2001 Document Revised: 01/03/2015 Document Reviewed: 04/30/2014 Elsevier Interactive Patient Education [employer]2016 Elsevier Inc.
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
## 2016-07-13 MED FILL — LORazepam 1 MG TABS: 1 | 30 days supply | Qty: 90 | Fill #0

## 2016-07-13 MED FILL — MELOXICAM 15 MG TABLET: 15 | 30 days supply | Qty: 30 | Fill #0

## 2016-07-15 MED FILL — diazePAM 10 MG TABS: 10 | 15 days supply | Qty: 15 | Fill #0

## 2016-08-19 MED FILL — diazePAM 10 MG TABS: 10 | 15 days supply | Qty: 15 | Fill #0

## 2016-09-09 MED FILL — BUTALB-ACETAMIN-CAFF 50-325: 50-325-40 | 5 days supply | Qty: 30 | Fill #0

## 2016-09-13 MED FILL — BUTALB-ACETAMIN-CAFF 50-325: 50-325-40 | 5 days supply | Qty: 30 | Fill #1

## 2016-09-28 MED FILL — ALPRAZolam 1 MG TABS: 1 | 90 days supply | Qty: 360 | Fill #0

## 2016-10-05 ENCOUNTER — Emergency Department (HOSPITAL_COMMUNITY)
Admission: EM | Admit: 2016-10-05 | Discharge: 2016-10-05 | Disposition: A | Discharge status: Home / Self Care | Payer: Medicaid Other | Attending: Emergency Medicine | Admitting: Emergency Medicine

## 2016-10-05 ENCOUNTER — Encounter (HOSPITAL_COMMUNITY): Payer: Self-pay | Admitting: Emergency Medicine

## 2016-10-05 DIAGNOSIS — Y92410 Unspecified street and highway as the place of occurrence of the external cause: Secondary | ICD-10-CM | POA: Diagnosis not present

## 2016-10-05 DIAGNOSIS — M549 Dorsalgia, unspecified: Secondary | ICD-10-CM | POA: Insufficient documentation

## 2016-10-05 DIAGNOSIS — M542 Cervicalgia: Secondary | ICD-10-CM | POA: Insufficient documentation

## 2016-10-05 DIAGNOSIS — Y999 Unspecified external cause status: Secondary | ICD-10-CM | POA: Diagnosis not present

## 2016-10-05 DIAGNOSIS — Y9389 Activity, other specified: Secondary | ICD-10-CM | POA: Diagnosis not present

## 2016-10-05 DIAGNOSIS — F17210 Nicotine dependence, cigarettes, uncomplicated: Secondary | ICD-10-CM | POA: Diagnosis not present

## 2016-10-05 DIAGNOSIS — Z79899 Other long term (current) drug therapy: Secondary | ICD-10-CM | POA: Insufficient documentation

## 2016-10-05 MED ORDER — NAPROXEN 500 MG PO TABS: 500.0000 mg | ORAL_TABLET | Freq: Two times a day (BID) | ORAL | 0 refills | Status: DC

## 2016-10-05 MED ORDER — METHOCARBAMOL 500 MG PO TABS: 500.0000 mg | ORAL_TABLET | Freq: Two times a day (BID) | ORAL | 0 refills | Status: DC

## 2016-10-05 NOTE — ED Notes (Signed)
Patient was alert, oriented and stable upon discharge. RN went over AVS and patient had no further questions.  
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
## 2016-10-05 NOTE — ED Notes (Signed)
Urine sample in triage 

## 2016-10-05 NOTE — Discharge Instructions (Signed)
Take the prescribed medication as directed. May wish to try heat therapy to her neck and back, this will likely help with some muscle tension. Follow-up with your primary care doctor. Return to the ED for new or worsening symptoms.
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
## 2016-10-18 MED FILL — BUTALB-ACETAMIN-CAFF 50-325: 50-325-40 | 15 days supply | Qty: 90 | Fill #0

## 2016-11-11 MED FILL — traZODone HCL 100 MG TABS: 100 | 90 days supply | Qty: 180 | Fill #0

## 2016-11-11 MED FILL — MELOXICAM 15 MG TABLET: 15 | 30 days supply | Qty: 30 | Fill #1

## 2016-12-19 ENCOUNTER — Encounter (HOSPITAL_COMMUNITY): Payer: Self-pay

## 2016-12-19 ENCOUNTER — Emergency Department (HOSPITAL_COMMUNITY)
Admission: EM | Admit: 2016-12-19 | Discharge: 2016-12-19 | Disposition: A | Discharge status: Home / Self Care | Payer: Medicaid Other | Attending: Emergency Medicine | Admitting: Emergency Medicine

## 2016-12-19 DIAGNOSIS — I252 Old myocardial infarction: Secondary | ICD-10-CM | POA: Diagnosis not present

## 2016-12-19 DIAGNOSIS — G43909 Migraine, unspecified, not intractable, without status migrainosus: Secondary | ICD-10-CM | POA: Diagnosis present

## 2016-12-19 DIAGNOSIS — G43809 Other migraine, not intractable, without status migrainosus: Secondary | ICD-10-CM

## 2016-12-19 DIAGNOSIS — F17210 Nicotine dependence, cigarettes, uncomplicated: Secondary | ICD-10-CM | POA: Insufficient documentation

## 2016-12-19 MED ORDER — BUTALBITAL-APAP-CAFFEINE 50-325-40 MG PO TABS: 1.0000 | ORAL_TABLET | Freq: Four times a day (QID) | ORAL | 0 refills | Status: DC | PRN

## 2016-12-19 NOTE — ED Provider Notes (Signed)
[facility]-EMERGENCY DEPT Provider Note   CSN: [id number] Arrival date & time: 12/19/16  0908     History   Chief Complaint Chief Complaint  Patient presents with  . Migraine  . Medication Refill    HPI [name] is a [age] y.o. female.  HPI   Migraine started 4AM, getting progressively worse, severe pain, throbbing/creeping on right side of head  Hx of chronic migraines, go from neck up and over  Haven't started throwing up yet, but feeling nausea  Yesterday had aura, right visual changes  HA same as prior headaches Out of fiorecet 6/10 right now, worried it will worsen No neuro symptoms, no fevers, no trauma  Past Medical History:  Diagnosis Date  . Abnormal maternal glucose tolerance, complicating pregnancy, childbirth, or the puerperium, unspecified as to episode of care   . Acute MI 2004  . Chronic back pain   . Generalized anxiety disorder   . GERD (gastroesophageal reflux disease)   . Hyperlipidemia   . Migraine    since age 89  . Panic disorder without agoraphobia   . Tobacco use disorder     Patient Active Problem List   Diagnosis Date Noted  . Vaginal bleeding 02/05/2016  . Left arm weakness 05/14/2015  . Chronic back pain 05/05/2015  . Grief reaction 05/05/2015  . Chest pain 11/02/2013  . Healthcare maintenance 04/20/2011  . Hyperlipidemia 04/20/2011  . CIGARETTE SMOKER 02/21/2009  . H/O gestational diabetes mellitus, not currently pregnant 12/11/2007  . ELEVATED BP READING WITHOUT DX HYPERTENSION 12/11/2007  . PANIC ATTACK 10/17/2007  . ANXIETY DISORDER, GENERALIZED 10/17/2007  . Migraine with aura 10/17/2007    Past Surgical History:  Procedure Laterality Date  . ABDOMINAL HYSTERECTOMY  2009  . ADENOIDECTOMY    . BILATERAL SALPINGOOPHORECTOMY  12/11   by DrBernardo  . CESAREAN SECTION  2004, 2006  . CHOLECYSTECTOMY    . RHINOPLASTY    . TONSILLECTOMY      OB History    Gravida Para Term Preterm AB Living   2 2           SAB TAB  Ectopic Multiple Live Births                   Home Medications    Prior to Admission medications   Medication Sig Start Date End Date Taking? Authorizing Provider  acetaminophen (TYLENOL) 500 MG tablet Take 1,000 mg by mouth every 6 (six) hours as needed for moderate pain or headache.    Historical Provider, MD  ALPRAZolam Prudy Feeler(XANAX) 1 MG tablet Take 1 mg by mouth 4 (four) times daily.     Historical Provider, MD  baclofen (LIORESAL) 20 MG tablet Take 20 mg by mouth 3 (three) times daily as needed. Muscle spasms 06/24/16   Historical Provider, MD  butalbital-acetaminophen-caffeine (FIORICET) 50-325-40 MG tablet Take 1-2 tablets by mouth every 6 (six) hours as needed for headache. 06/03/16 06/03/17  Melene Planan Floyd, DO  butalbital-acetaminophen-caffeine (FIORICET, ESGIC) 50-325-40 MG tablet Take 1 tablet by mouth every 6 (six) hours as needed for headache. 12/19/16 12/19/17  Alvira MondayErin Lenell Lama, MD  calcium carbonate (TUMS - DOSED IN MG ELEMENTAL CALCIUM) 500 MG chewable tablet Chew 2 tablets by mouth daily as needed for indigestion or heartburn. Reported on 03/19/2016    Historical Provider, MD  Cholecalciferol (VITAMIN D PO) Take 1 capsule by mouth daily. Reported on 03/19/2016    Historical Provider, MD  diazepam (VALIUM) 10 MG tablet Take 10 mg by mouth  at bedtime as needed for sleep.     Historical Provider, MD  diclofenac (VOLTAREN) 75 MG EC tablet Take 1 tablet (75 mg total) by mouth 2 (two) times daily. Patient not taking: Reported on 07/06/2016 03/19/16   Morrell Riddle, PA-C  diclofenac sodium (VOLTAREN) 1 % GEL Apply 2 g topically 2 (two) times daily as needed (pain).    Historical Provider, MD  HYDROcodone-homatropine (HYCODAN) 5-1.5 MG/5ML syrup Take 5 mLs by mouth every 6 (six) hours as needed for cough. Patient not taking: Reported on 02/26/2016 02/07/16   Barbaraann Barthel, MD  levalbuterol Pauline Aus) 0.63 MG/3ML nebulizer solution Take 3 mLs (0.63 mg total) by nebulization once. Patient not taking:  Reported on 07/06/2016 03/19/16   Morrell Riddle, PA-C  metaxalone (SKELAXIN) 800 MG tablet Take 0.5-1 tablets (400-800 mg total) by mouth 3 (three) times daily. Patient not taking: Reported on 07/06/2016 03/19/16   Morrell Riddle, PA-C  methocarbamol (ROBAXIN) 500 MG tablet Take 1 tablet (500 mg total) by mouth 2 (two) times daily. 10/05/16   Garlon Hatchet, PA-C  Multiple Vitamin (MULTIVITAMIN WITH MINERALS) TABS tablet Take 1 tablet by mouth daily. Reported on 03/19/2016    Historical Provider, MD  naproxen (NAPROSYN) 500 MG tablet Take 1 tablet (500 mg total) by mouth 2 (two) times daily with a meal. 10/05/16   Garlon Hatchet, PA-C  traZODone (DESYREL) 100 MG tablet Take 100 mg by mouth at bedtime as needed. sleep 04/20/16   Historical Provider, MD  VITAMIN E PO Take 1 tablet by mouth daily. Reported on 03/19/2016    Historical Provider, MD    Family History Family History  Problem Relation Age of Onset  . Coronary artery disease Father   . Heart disease Father   . Depression Father   . Anxiety disorder Father   . Hyperlipidemia Father   . Hypertension Father   . Stroke Father   . Asthma Mother   . Heart disease Mother   . Depression Mother   . Anxiety disorder Mother   . Depression Brother   . Anxiety disorder Brother   . Hypertension Brother     Social History Social History  Substance Use Topics  . Smoking status: Current Some Day Smoker    Packs/day: 1.00    Years: 35.00    Types: Cigarettes  . Smokeless tobacco: Never Used  . Alcohol use No     Allergies   Imitrex [sumatriptan]; Relpax [eletriptan]; Topamax [topiramate]; and Mucinex [guaifenesin er]   Review of Systems Review of Systems  Constitutional: Negative for fever.  HENT: Negative for congestion and sore throat.   Eyes: Negative for visual disturbance.  Respiratory: Negative for cough and shortness of breath.   Cardiovascular: Negative for chest pain.  Gastrointestinal: Positive for nausea. Negative for  abdominal pain and vomiting.  Genitourinary: Negative for difficulty urinating.  Musculoskeletal: Positive for neck pain. Negative for back pain.  Skin: Negative for rash.  Neurological: Positive for headaches. Negative for syncope, facial asymmetry, speech difficulty, weakness and numbness.     Physical Exam Updated Vital Signs BP (!) 151/104   Pulse 92   Temp 97.9 F (36.6 C) (Oral)   Resp 16   Ht 5\' 4"  (1.626 m)   Wt 120 lb (54.4 kg)   SpO2 100%   BMI 20.60 kg/m   Physical Exam  Constitutional: She is oriented to person, place, and time. She appears well-developed and well-nourished. No distress.  HENT:  Head: Normocephalic  and atraumatic.  Eyes: Conjunctivae and EOM are normal.  Neck: Normal range of motion.  Cardiovascular: Normal rate, regular rhythm, normal heart sounds and intact distal pulses.  Exam reveals no gallop and no friction rub.   No murmur heard. Pulmonary/Chest: Effort normal and breath sounds normal. No respiratory distress. She has no wheezes. She has no rales.  Abdominal: Soft. She exhibits no distension. There is no tenderness. There is no guarding.  Musculoskeletal: She exhibits no edema or tenderness.  Neurological: She is alert and oriented to person, place, and time. She has normal strength. No cranial nerve deficit or sensory deficit. Coordination and gait normal. GCS eye subscore is 4. GCS verbal subscore is 5. GCS motor subscore is 6.  Skin: Skin is warm and dry. No rash noted. She is not diaphoretic. No erythema.  Nursing note and vitals reviewed.    ED Treatments / Results  Labs (all labs ordered are listed, but only abnormal results are displayed) Labs Reviewed - No data to display  EKG  EKG Interpretation None       Radiology No results found.  Procedures Procedures (including critical care time)  Medications Ordered in ED Medications - No data to display   Initial Impression / Assessment and Plan / ED Course  I have  reviewed the triage vital signs and the nursing notes.  Pertinent labs & imaging results that were available during my care of the patient were reviewed by me and considered in my medical decision making (see chart for details).  Clinical Course    50yo female with history of migraines presents with concern of headache.  Headache began slowly, no trauma, no fevers, and normal neurologic exam and [facility], [facility] or meningitis. Patient declines medications in ED. Given refill of fioricet which she reports helps with breakthrough HA. Patient discharged in stable condition with understanding of reasons to return.    Final Clinical Impressions(s) / ED Diagnoses   Final diagnoses:  Other migraine without status migrainosus, not intractable    New Prescriptions Discharge Medication List as of 12/19/2016  9:58 AM    START taking these medications   Details  !! butalbital-acetaminophen-caffeine (FIORICET, ESGIC) 50-325-40 MG tablet Take 1 tablet by mouth every 6 (six) hours as needed for headache., Starting Sun 12/19/2016, Until Mon 12/19/2017, Print     !! - Potential duplicate medications found. Please discuss with provider.       Alvira MondayErin Ashia Dehner, MD 12/19/16 [phone]

## 2016-12-19 NOTE — ED Triage Notes (Addendum)
Pt c/o migraine starting this morning.  Pain score 8/10.  Pt reports that she ran out of her medication(Fioricet and Norco) and x 4 days ago.  Hx of migraine.  [facility]

## 2016-12-22 ENCOUNTER — Emergency Department (HOSPITAL_COMMUNITY)
Admission: EM | Admit: 2016-12-22 | Discharge: 2016-12-22 | Disposition: A | Discharge status: Home / Self Care | Payer: No Typology Code available for payment source | Attending: Emergency Medicine | Admitting: Emergency Medicine

## 2016-12-22 ENCOUNTER — Emergency Department (HOSPITAL_COMMUNITY): Payer: No Typology Code available for payment source

## 2016-12-22 ENCOUNTER — Encounter (HOSPITAL_COMMUNITY): Payer: Self-pay | Admitting: Emergency Medicine

## 2016-12-22 DIAGNOSIS — Y92410 Unspecified street and highway as the place of occurrence of the external cause: Secondary | ICD-10-CM | POA: Insufficient documentation

## 2016-12-22 DIAGNOSIS — S199XXA Unspecified injury of neck, initial encounter: Secondary | ICD-10-CM | POA: Diagnosis present

## 2016-12-22 DIAGNOSIS — S161XXA Strain of muscle, fascia and tendon at neck level, initial encounter: Secondary | ICD-10-CM | POA: Diagnosis not present

## 2016-12-22 DIAGNOSIS — R51 Headache: Secondary | ICD-10-CM | POA: Insufficient documentation

## 2016-12-22 DIAGNOSIS — Y999 Unspecified external cause status: Secondary | ICD-10-CM | POA: Insufficient documentation

## 2016-12-22 DIAGNOSIS — F17210 Nicotine dependence, cigarettes, uncomplicated: Secondary | ICD-10-CM | POA: Insufficient documentation

## 2016-12-22 DIAGNOSIS — Z79899 Other long term (current) drug therapy: Secondary | ICD-10-CM | POA: Insufficient documentation

## 2016-12-22 DIAGNOSIS — Y939 Activity, unspecified: Secondary | ICD-10-CM | POA: Insufficient documentation

## 2016-12-22 DIAGNOSIS — I252 Old myocardial infarction: Secondary | ICD-10-CM | POA: Insufficient documentation

## 2016-12-22 DIAGNOSIS — R519 Headache, unspecified: Secondary | ICD-10-CM

## 2016-12-22 MED ORDER — METHOCARBAMOL 500 MG PO TABS: 500.0000 mg | ORAL_TABLET | Freq: Two times a day (BID) | ORAL | 0 refills | Status: DC

## 2016-12-22 MED ORDER — KETOROLAC TROMETHAMINE 60 MG/2ML IM SOLN
30.0000 mg | Freq: Once | INTRAMUSCULAR | Status: AC
  Administered 2016-12-22: 30 mg via INTRAMUSCULAR
  Filled 2016-12-22: qty 2

## 2016-12-22 MED ORDER — PROMETHAZINE HCL 25 MG PO TABS: 25.0000 mg | ORAL_TABLET | Freq: Once | ORAL | Status: DC

## 2016-12-22 MED FILL — METHOCARBAMOL 500 MG TABLET: 500 | 10 days supply | Qty: 20 | Fill #0

## 2016-12-22 NOTE — Discharge Instructions (Signed)
Ibuprofen or tylenol for pain. Robaxin for spasms. Follow up with your doctor.
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
## 2016-12-22 NOTE — ED Notes (Signed)
Pt requesting IM phenergan for oncoming migraine.  PA ordered PO due to lethargy.  Pt refusing and will take her med at home.

## 2016-12-28 MED FILL — ALPRAZolam 1 MG TABS: 1 | 90 days supply | Qty: 360 | Fill #0

## 2017-01-04 ENCOUNTER — Encounter (HOSPITAL_COMMUNITY): Payer: Self-pay | Admitting: Family Medicine

## 2017-01-04 ENCOUNTER — Ambulatory Visit (HOSPITAL_COMMUNITY)
Admission: EM | Admit: 2017-01-04 | Discharge: 2017-01-04 | Disposition: A | Discharge status: Home / Self Care | Payer: Medicaid Other | Attending: Family Medicine | Admitting: Family Medicine

## 2017-01-04 DIAGNOSIS — G43809 Other migraine, not intractable, without status migrainosus: Secondary | ICD-10-CM

## 2017-01-04 MED ORDER — KETOROLAC TROMETHAMINE 30 MG/ML IJ SOLN
INTRAMUSCULAR | Status: AC
  Filled 2017-01-04: qty 1

## 2017-01-04 MED ORDER — ONDANSETRON HCL 4 MG/2ML IJ SOLN
4.0000 mg | Freq: Once | INTRAMUSCULAR | Status: AC
  Administered 2017-01-04: 4 mg via INTRAMUSCULAR

## 2017-01-04 MED ORDER — KETOROLAC TROMETHAMINE 30 MG/ML IJ SOLN
30.0000 mg | Freq: Once | INTRAMUSCULAR | Status: AC
  Administered 2017-01-04: 30 mg via INTRAMUSCULAR

## 2017-01-04 MED ORDER — ONDANSETRON 4 MG PO TBDP
ORAL_TABLET | ORAL | Status: AC
  Filled 2017-01-04: qty 1

## 2017-01-04 MED ORDER — ONDANSETRON HCL 4 MG/2ML IJ SOLN
INTRAMUSCULAR | Status: AC
  Filled 2017-01-04: qty 2

## 2017-01-04 MED ORDER — TRAZODONE HCL 100 MG PO TABS: 100.0000 mg | ORAL_TABLET | Freq: Every evening | ORAL | 0 refills | Status: DC | PRN

## 2017-01-04 NOTE — Discharge Instructions (Signed)
See your doctor if further problems. °

## 2017-01-04 NOTE — ED Provider Notes (Signed)
[facility]    CSN: [id number] Arrival date & time: 01/04/17  1342     History   Chief Complaint Chief Complaint  Patient presents with  . Migraine    HPI [name] is a 51 y.o. female.   The history is provided by the patient.  Migraine  This is a chronic problem. The current episode started more than 2 days ago. The problem has not changed since onset.Associated symptoms include headaches. Associated symptoms comments: Reports 11 similar cluster ha in her life, has seen specialists for same.. Nothing aggravates the symptoms. Nothing relieves the symptoms.    Past Medical History:  Diagnosis Date  . Abnormal maternal glucose tolerance, complicating pregnancy, childbirth, or the puerperium, unspecified as to episode of care   . Acute MI 2004  . Chronic back pain   . Generalized anxiety disorder   . GERD (gastroesophageal reflux disease)   . Hyperlipidemia   . Migraine    since age 12  . Panic disorder without agoraphobia   . Tobacco use disorder     Patient Active Problem List   Diagnosis Date Noted  . Vaginal bleeding 02/05/2016  . Left arm weakness 05/14/2015  . Chronic back pain 05/05/2015  . Grief reaction 05/05/2015  . Chest pain 11/02/2013  . Healthcare maintenance 04/20/2011  . Hyperlipidemia 04/20/2011  . CIGARETTE SMOKER 02/21/2009  . H/O gestational diabetes mellitus, not currently pregnant 12/11/2007  . ELEVATED BP READING WITHOUT DX HYPERTENSION 12/11/2007  . PANIC ATTACK 10/17/2007  . ANXIETY DISORDER, GENERALIZED 10/17/2007  . Migraine with aura 10/17/2007    Past Surgical History:  Procedure Laterality Date  . ABDOMINAL HYSTERECTOMY  2009  . ADENOIDECTOMY    . BILATERAL SALPINGOOPHORECTOMY  12/11   by DrBernardo  . CESAREAN SECTION  2004, 2006  . CHOLECYSTECTOMY    . RHINOPLASTY    . TONSILLECTOMY      OB History    Gravida Para Term Preterm AB Living   2 2           SAB TAB Ectopic Multiple Live Births                   Home Medications    Prior to Admission medications   Medication Sig Start Date End Date Taking? Authorizing Provider  acetaminophen (TYLENOL) 500 MG tablet Take 1,000 mg by mouth every 6 (six) hours as needed for moderate pain or headache.    Historical Provider, MD  ALPRAZolam Prudy Feeler) 1 MG tablet Take 1 mg by mouth 4 (four) times daily.     Historical Provider, MD  baclofen (LIORESAL) 20 MG tablet Take 20 mg by mouth 3 (three) times daily as needed. Muscle spasms 06/24/16   Historical Provider, MD  butalbital-acetaminophen-caffeine (FIORICET) 50-325-40 MG tablet Take 1-2 tablets by mouth every 6 (six) hours as needed for headache. 06/03/16 06/03/17  Melene Plan, DO  butalbital-acetaminophen-caffeine (FIORICET, ESGIC) 50-325-40 MG tablet Take 1 tablet by mouth every 6 (six) hours as needed for headache. 12/19/16 12/19/17  Alvira Monday, MD  calcium carbonate (TUMS - DOSED IN MG ELEMENTAL CALCIUM) 500 MG chewable tablet Chew 2 tablets by mouth daily as needed for indigestion or heartburn. Reported on 03/19/2016    Historical Provider, MD  Cholecalciferol (VITAMIN D PO) Take 1 capsule by mouth daily. Reported on 03/19/2016    Historical Provider, MD  diazepam (VALIUM) 10 MG tablet Take 10 mg by mouth at bedtime as needed for sleep.     Historical  Provider, MD  diclofenac (VOLTAREN) 75 MG EC tablet Take 1 tablet (75 mg total) by mouth 2 (two) times daily. Patient not taking: Reported on 07/06/2016 03/19/16   Morrell Riddle, PA-C  diclofenac sodium (VOLTAREN) 1 % GEL Apply 2 g topically 2 (two) times daily as needed (pain).    Historical Provider, MD  HYDROcodone-homatropine (HYCODAN) 5-1.5 MG/5ML syrup Take 5 mLs by mouth every 6 (six) hours as needed for cough. Patient not taking: Reported on 02/26/2016 02/07/16   Barbaraann Barthel, MD  levalbuterol Pauline Aus) 0.63 MG/3ML nebulizer solution Take 3 mLs (0.63 mg total) by nebulization once. Patient not taking: Reported on 07/06/2016 03/19/16   Morrell Riddle, PA-C    metaxalone (SKELAXIN) 800 MG tablet Take 0.5-1 tablets (400-800 mg total) by mouth 3 (three) times daily. Patient not taking: Reported on 07/06/2016 03/19/16   Morrell Riddle, PA-C  methocarbamol (ROBAXIN) 500 MG tablet Take 1 tablet (500 mg total) by mouth 2 (two) times daily. 12/22/16   Tatyana Kirichenko, PA-C  Multiple Vitamin (MULTIVITAMIN WITH MINERALS) TABS tablet Take 1 tablet by mouth daily. Reported on 03/19/2016    Historical Provider, MD  naproxen (NAPROSYN) 500 MG tablet Take 1 tablet (500 mg total) by mouth 2 (two) times daily with a meal. 10/05/16   Garlon Hatchet, PA-C  traZODone (DESYREL) 100 MG tablet Take 100 mg by mouth at bedtime as needed. sleep 04/20/16   Historical Provider, MD  VITAMIN E PO Take 1 tablet by mouth daily. Reported on 03/19/2016    Historical Provider, MD    Family History Family History  Problem Relation Age of Onset  . Coronary artery disease Father   . Heart disease Father   . Depression Father   . Anxiety disorder Father   . Hyperlipidemia Father   . Hypertension Father   . Stroke Father   . Asthma Mother   . Heart disease Mother   . Depression Mother   . Anxiety disorder Mother   . Depression Brother   . Anxiety disorder Brother   . Hypertension Brother     Social History Social History  Substance Use Topics  . Smoking status: Current Some Day Smoker    Packs/day: 1.00    Years: 35.00    Types: Cigarettes  . Smokeless tobacco: Never Used  . Alcohol use No     Allergies   Imitrex [sumatriptan]; Relpax [eletriptan]; Topamax [topiramate]; and Mucinex [guaifenesin er]   Review of Systems Review of Systems  Constitutional: Negative.   HENT: Negative.   Neurological: Positive for headaches. Negative for dizziness, facial asymmetry, weakness, light-headedness and numbness.     Physical Exam Triage Vital Signs ED Triage Vitals  Enc Vitals Group     BP 01/04/17 1525 119/90     Pulse Rate 01/04/17 1525 98     Resp 01/04/17 1525  18     Temp 01/04/17 1525 98.2 F (36.8 C)     Temp src --      SpO2 01/04/17 1525 98 %     Weight --      Height --      Head Circumference --      Peak Flow --      Pain Score 01/04/17 1524 8     Pain Loc --      Pain Edu? --      Excl. in GC? --    No data found.   Updated Vital Signs BP 119/90   Pulse 98  Temp 98.2 F (36.8 C)   Resp 18   SpO2 98%   Visual Acuity Right Eye Distance:   Left Eye Distance:   Bilateral Distance:    Right Eye Near:   Left Eye Near:    Bilateral Near:     Physical Exam  Constitutional: She is oriented to person, place, and time. She appears well-developed and well-nourished. No distress.  Eyes: Conjunctivae and EOM are normal. Pupils are equal, round, and reactive to light.  Neck: Normal range of motion. Neck supple.  Cardiovascular: Normal rate.   Pulmonary/Chest: Effort normal.  Abdominal: Soft. Bowel sounds are normal.  Musculoskeletal: Normal range of motion.  Lymphadenopathy:    She has no cervical adenopathy.  Neurological: She is alert and oriented to person, place, and time. No cranial nerve deficit. She exhibits normal muscle tone. Coordination normal.  Skin: Skin is warm and dry.  Nursing note and vitals reviewed.    UC Treatments / Results  Labs (all labs ordered are listed, but only abnormal results are displayed) Labs Reviewed - No data to display  EKG  EKG Interpretation None       Radiology No results found.  Procedures Procedures (including critical care time)  Medications Ordered in UC Medications - No data to display   Initial Impression / Assessment and Plan / UC Course  I have reviewed the triage vital signs and the nursing notes.  Pertinent labs & imaging results that were available during my care of the patient were reviewed by me and considered in my medical decision making (see chart for details).  Clinical Course       Final Clinical Impressions(s) / UC Diagnoses   Final  diagnoses:  None    New Prescriptions New Prescriptions   No medications on file     Linna HoffJames D Safiyah Cisney, MD 01/23/17 1313

## 2017-01-04 NOTE — ED Triage Notes (Signed)
Pt here for cluster headache x 4 days. sts hx of migraines.

## 2017-02-04 ENCOUNTER — Encounter (HOSPITAL_COMMUNITY): Payer: Self-pay | Admitting: Emergency Medicine

## 2017-02-04 ENCOUNTER — Ambulatory Visit (HOSPITAL_COMMUNITY)
Admission: EM | Admit: 2017-02-04 | Discharge: 2017-02-04 | Disposition: A | Discharge status: Home / Self Care | Payer: Medicaid Other | Attending: Family Medicine | Admitting: Family Medicine

## 2017-02-04 DIAGNOSIS — S161XXA Strain of muscle, fascia and tendon at neck level, initial encounter: Secondary | ICD-10-CM

## 2017-02-04 DIAGNOSIS — S39012A Strain of muscle, fascia and tendon of lower back, initial encounter: Secondary | ICD-10-CM

## 2017-02-04 MED ORDER — KETOROLAC TROMETHAMINE 60 MG/2ML IM SOLN
60.0000 mg | Freq: Once | INTRAMUSCULAR | Status: AC
  Administered 2017-02-04: 60 mg via INTRAMUSCULAR

## 2017-02-04 MED ORDER — KETOROLAC TROMETHAMINE 60 MG/2ML IM SOLN
INTRAMUSCULAR | Status: AC
  Filled 2017-02-04: qty 2

## 2017-02-04 MED ORDER — METHOCARBAMOL 500 MG PO TABS: 500.0000 mg | ORAL_TABLET | Freq: Three times a day (TID) | ORAL | 0 refills | Status: DC | PRN

## 2017-02-04 MED ORDER — MELOXICAM 7.5 MG PO TABS: 7.5000 mg | ORAL_TABLET | Freq: Every day | ORAL | 0 refills | Status: DC

## 2017-02-04 MED FILL — BACLOFEN 20 MG TABLET: 20 | 30 days supply | Qty: 90 | Fill #0

## 2017-02-04 MED FILL — METHOCARBAMOL 500 MG TABLET: 500 | 10 days supply | Qty: 30 | Fill #0

## 2017-02-04 MED FILL — MELOXICAM 7.5 MG TABLET: 7.5 | 10 days supply | Qty: 10 | Fill #0
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
## 2017-03-25 MED FILL — ALPRAZolam 1 MG TABS: 1 | 90 days supply | Qty: 360 | Fill #1

## 2017-06-07 MED FILL — GABAPENTIN 300 MG CAPSULE: 300 | 30 days supply | Qty: 120 | Fill #0

## 2017-06-27 MED FILL — HYDROCODON-APAP 10-325: 10-325 | 7 days supply | Qty: 28 | Fill #0

## 2017-06-27 MED FILL — ALPRAZolam 1 MG TABS: 1 | 90 days supply | Qty: 360 | Fill #0

## 2017-07-05 MED FILL — GABAPENTIN 300 MG CAPSULE: 300 | 30 days supply | Qty: 120 | Fill #1

## 2017-08-09 MED FILL — GABAPENTIN 300 MG CAPSULE: 300 | 30 days supply | Qty: 120 | Fill #2

## 2017-09-01 MED FILL — GABAPENTIN 300 MG CAPSULE: 300 | 30 days supply | Qty: 120 | Fill #3

## 2017-09-23 MED FILL — ALPRAZolam 1 MG TABS: 1 | 90 days supply | Qty: 360 | Fill #1

## 2017-09-26 MED FILL — GABAPENTIN 300 MG CAPSULE: 300 | 30 days supply | Qty: 120 | Fill #4

## 2017-10-19 MED FILL — GABAPENTIN 300 MG CAPSULE: 300 | 30 days supply | Qty: 120 | Fill #5

## 2017-11-11 MED FILL — GABAPENTIN 300 MG CAPSULE: 300 | 30 days supply | Qty: 120 | Fill #6

## 2017-11-15 MED FILL — ALPRAZolam 1 MG TABS: 1 | 90 days supply | Qty: 360 | Fill #0

## 2017-11-21 ENCOUNTER — Emergency Department (HOSPITAL_COMMUNITY)
Admission: EM | Admit: 2017-11-21 | Discharge: 2017-11-22 | Disposition: A | Discharge status: Home / Self Care | Payer: Medicaid Other | Attending: Emergency Medicine | Admitting: Emergency Medicine

## 2017-11-21 DIAGNOSIS — F17210 Nicotine dependence, cigarettes, uncomplicated: Secondary | ICD-10-CM | POA: Insufficient documentation

## 2017-11-21 DIAGNOSIS — G43809 Other migraine, not intractable, without status migrainosus: Secondary | ICD-10-CM | POA: Insufficient documentation

## 2017-11-21 DIAGNOSIS — Z79899 Other long term (current) drug therapy: Secondary | ICD-10-CM | POA: Insufficient documentation

## 2017-11-21 DIAGNOSIS — R51 Headache: Secondary | ICD-10-CM | POA: Diagnosis present

## 2017-11-21 MED ORDER — ONDANSETRON 4 MG PO TBDP
4.0000 mg | ORAL_TABLET | Freq: Once | ORAL | Status: AC | PRN
  Administered 2017-11-21: 4 mg via ORAL
  Filled 2017-11-21: qty 1

## 2017-11-21 NOTE — ED Triage Notes (Addendum)
Pt reports she has had a cluster HA for the past 3 days. Hx of cluster HAs. Has been throwing up at home due to HA. Able to keep down fluids at present. Pt tried home vioden, but just threw it up.  Pt does not want IV HA coctail and is driving home.

## 2017-11-21 NOTE — ED Provider Notes (Signed)
[facility]-EMERGENCY DEPT Provider Note   CSN: [id number] Arrival date & time: 11/21/17  1835     History   Chief Complaint Chief Complaint  Patient presents with  . Headache    HPI [name] is a 51 y.o. female.  HPI Patient is a 51 year old female reports a long-standing history of migraine headaches since she was [age].  She tried Toradol her primary care doctor's office today without improvement.  She is requesting a prescription for Fioricet at this time as she has to leave in the next 4 minutes secondary to childcare issues.  She does not want injections of medications nor does she want IV injections of medications to try and help her headache.  She states she just needs a prescription.  She is reports a cluster headache for the past 3 days.  She has had nausea and vomiting inability to keep fluids down.  She denies fevers and chills.  No weakness of her arms or legs.  Pain is moderate to severe in severity   Past Medical History:  Diagnosis Date  . Abnormal maternal glucose tolerance, complicating pregnancy, childbirth, or the puerperium, unspecified as to episode of care   . Acute MI 2004  . Chronic back pain   . Generalized anxiety disorder   . GERD (gastroesophageal reflux disease)   . Hyperlipidemia   . Migraine    since age 89  . Panic disorder without agoraphobia   . Tobacco use disorder     Patient Active Problem List   Diagnosis Date Noted  . Vaginal bleeding 02/05/2016  . Left arm weakness 05/14/2015  . Chronic back pain 05/05/2015  . Grief reaction 05/05/2015  . Chest pain 11/02/2013  . Healthcare maintenance 04/20/2011  . Hyperlipidemia 04/20/2011  . CIGARETTE SMOKER 02/21/2009  . H/O gestational diabetes mellitus, not currently pregnant 12/11/2007  . ELEVATED BP READING WITHOUT DX HYPERTENSION 12/11/2007  . PANIC ATTACK 10/17/2007  . ANXIETY DISORDER, GENERALIZED 10/17/2007  . Migraine with aura 10/17/2007    Past  Surgical History:  Procedure Laterality Date  . ABDOMINAL HYSTERECTOMY  2009  . ADENOIDECTOMY    . BILATERAL SALPINGOOPHORECTOMY  12/11   by DrBernardo  . CESAREAN SECTION  2004, 2006  . CHOLECYSTECTOMY    . RHINOPLASTY    . TONSILLECTOMY      OB History    Gravida Para Term Preterm AB Living   2 2           SAB TAB Ectopic Multiple Live Births                   Home Medications    Prior to Admission medications   Medication Sig Start Date End Date Taking? Authorizing Provider  acetaminophen (TYLENOL) 500 MG tablet Take 1,000 mg by mouth every 6 (six) hours as needed for moderate pain or headache.   Yes [provider]  ALPRAZolam Prudy Feeler(XANAX) 1 MG tablet Take 1 mg by mouth 4 (four) times daily.    Yes [provider]  HYDROcodone-acetaminophen (NORCO) 10-325 MG tablet Take 1 tablet by mouth every 6 (six) hours as needed for moderate pain.   Yes [provider]  Multiple Vitamin (MULTIVITAMIN WITH MINERALS) TABS tablet Take 1 tablet by mouth daily. Reported on 03/19/2016   Yes [provider]    Family History Family History  Problem Relation Age of Onset  . Coronary artery disease Father   . Heart disease Father   . Depression Father   .  Anxiety disorder Father   . Hyperlipidemia Father   . Hypertension Father   . Stroke Father   . Asthma Mother   . Heart disease Mother   . Depression Mother   . Anxiety disorder Mother   . Depression Brother   . Anxiety disorder Brother   . Hypertension Brother     Social History Social History   Tobacco Use  . Smoking status: Current Every Day Smoker    Packs/day: 1.00    Years: 35.00    Pack years: 35.00    Types: Cigarettes  . Smokeless tobacco: Never Used  Substance Use Topics  . Alcohol use: No    Alcohol/week: 0.0 oz  . Drug use: No     Allergies   Imitrex [sumatriptan]; Relpax [eletriptan]; Topamax [topiramate]; and Mucinex [guaifenesin er]   Review of Systems Review of  Systems  All other systems reviewed and are negative.    Physical Exam Updated Vital Signs BP (!) 138/92 (BP Location: Left Arm)   Pulse 87   Temp 97.6 F (36.4 C) (Oral)   Resp 16   Ht 5\' 5"  (1.651 m)   Wt 65.3 kg (144 lb)   SpO2 100%   BMI 23.96 kg/m   Physical Exam  Constitutional: She is oriented to person, place, and time. She appears well-developed and well-nourished.  HENT:  Head: Normocephalic and atraumatic.  Eyes: Pupils are equal, round, and reactive to light.  Cardiovascular: Regular rhythm.  Pulmonary/Chest: Effort normal.  Abdominal: Soft.  Musculoskeletal: Normal range of motion.  Neurological: She is alert and oriented to person, place, and time.  5/5 strength in major muscle groups of  bilateral upper and lower extremities. Speech normal. No facial asymetry.   Nursing note and vitals reviewed.    ED Treatments / Results  Labs (all labs ordered are listed, but only abnormal results are displayed) Labs Reviewed - No data to display  EKG  EKG Interpretation None       Radiology No results found.  Procedures Procedures (including critical care time)  Medications Ordered in ED Medications  ondansetron (ZOFRAN-ODT) disintegrating tablet 4 mg (4 mg Oral Given 11/21/17 1900)     Initial Impression / Assessment and Plan / ED Course  I have reviewed the triage vital signs and the nursing notes.  Pertinent labs & imaging results that were available during my care of the patient were reviewed by me and considered in my medical decision making (see chart for details).     11:44 PM Patient seen and evaluated.  Normal neurologic exam.  She is only requesting a prescription for Fioricet.  She does not want to try medications here in the emergency department.  She does not want IV fluids.  She does not want any additional workup.  She wants a prescription for Fioricet and to go home.  I informed her that I will be unable to prescribe her Fioricet.  She  became frustrated got up and walked out of the room.  Final Clinical Impressions(s) / ED Diagnoses   Final diagnoses:  Other migraine without status migrainosus, not intractable    ED Discharge Orders    None       Azalia Bilisampos, Eh Sauseda, MD 11/21/17 2345

## 2017-11-21 NOTE — ED Notes (Signed)
[facility], patient chart reviewed. Request patient upgrade to level 3.

## 2017-11-22 NOTE — ED Notes (Signed)
Pt stated she has a Arts administratorbaby sitter that leaves at 12 and could no longer stay. Pt refused vitals and signature for discharge. Left without paperwork.

## 2017-12-22 MED FILL — GABAPENTIN 300 MG CAPS: 300 | 30 days supply | Qty: 120 | Fill #7

## 2018-01-19 MED FILL — GABAPENTIN 300 MG CAPS: 300 | 30 days supply | Qty: 120 | Fill #8

## 2018-02-09 MED FILL — ALPRAZolam 1 MG TABS: 1 | 90 days supply | Qty: 360 | Fill #1

## 2018-02-17 MED FILL — GABAPENTIN 300 MG CAPS: 300 | 30 days supply | Qty: 120 | Fill #9

## 2018-03-13 MED FILL — CloNIDine HCL 0.1 MG TAB: 0.1 | 90 days supply | Qty: 90 | Fill #0

## 2018-03-14 MED FILL — GABAPENTIN 300 MG CAPS: 300 | 30 days supply | Qty: 120 | Fill #10

## 2018-04-18 MED FILL — HYDROCODON-APAP 10-325: 10-325 | 7 days supply | Qty: 28 | Fill #0

## 2018-04-25 MED FILL — GABAPENTIN 300 MG CAPSULE: 300 | 30 days supply | Qty: 120 | Fill #11

## 2018-05-12 MED FILL — diazePAM 10 MG TABS: 10 | 90 days supply | Qty: 270 | Fill #0

## 2018-06-01 MED FILL — GABAPENTIN 300 MG CAPSULE: 300 | 30 days supply | Qty: 120 | Fill #12

## 2018-06-28 MED FILL — GABAPENTIN 300 MG CAPSULE: 300 | 30 days supply | Qty: 120 | Fill #0

## 2018-07-25 MED FILL — GABAPENTIN 300 MG CAPSULE: 300 | 30 days supply | Qty: 120 | Fill #1

## 2018-08-11 MED FILL — ALPRAZolam 1 MG TABS: 1 | 90 days supply | Qty: 360 | Fill #0

## 2018-08-21 MED FILL — GABAPENTIN 300 MG CAPSULE: 300 | 30 days supply | Qty: 120 | Fill #2

## 2018-09-15 MED FILL — GABAPENTIN 300 MG CAPSULE: 300 | 30 days supply | Qty: 120 | Fill #3

## 2018-10-13 MED FILL — GABAPENTIN 300 MG CAPSULE: 300 | 30 days supply | Qty: 120 | Fill #4

## 2018-11-01 MED FILL — CloNIDine HCL 0.1 MG TAB: 0.1 | 30 days supply | Qty: 120 | Fill #0

## 2018-11-01 MED FILL — GABAPENTIN 300 MG CAPSULE: 300 | 30 days supply | Qty: 360 | Fill #0

## 2018-11-09 MED FILL — ALPRAZolam 1 MG TABS: 1 | 90 days supply | Qty: 360 | Fill #0

## 2018-11-30 MED FILL — GABAPENTIN 300 MG CAPSULE: 300 | 30 days supply | Qty: 360 | Fill #1

## 2018-12-13 MED FILL — CloNIDine HCL 0.1 MG TAB: 0.1 | 30 days supply | Qty: 120 | Fill #1

## 2018-12-28 MED FILL — GABAPENTIN 300 MG CAPSULE: 300 | 30 days supply | Qty: 360 | Fill #2

## 2019-01-09 MED FILL — CloNIDine HCL 0.1 MG TAB: 0.1 | 30 days supply | Qty: 120 | Fill #0

## 2019-01-26 MED FILL — GABAPENTIN 300 MG CAPSULE: 300 | 30 days supply | Qty: 360 | Fill #3

## 2019-02-07 MED FILL — ALPRAZolam 1 MG TABS: 1 | 90 days supply | Qty: 360 | Fill #0

## 2019-02-28 MED FILL — CloNIDine HCL 0.1 MG TAB: 0.1 | 30 days supply | Qty: 120 | Fill #1

## 2019-02-28 MED FILL — GABAPENTIN 300 MG CAPSULE: 300 | 30 days supply | Qty: 360 | Fill #4

## 2019-03-27 MED FILL — GABAPENTIN 300 MG CAPSULE: 300 | 30 days supply | Qty: 360 | Fill #5

## 2019-03-29 MED FILL — CloNIDine HCL 0.1 MG TAB: 0.1 | 30 days supply | Qty: 120 | Fill #2

## 2019-04-24 MED FILL — CloNIDine HCL 0.1 MG TAB: 0.1 | 30 days supply | Qty: 120 | Fill #3

## 2019-04-24 MED FILL — GABAPENTIN 300 MG CAPSULE: 300 | 30 days supply | Qty: 360 | Fill #6

## 2019-05-07 MED FILL — ALPRAZolam 1 MG TABS: 1 | 2 days supply | Qty: 8 | Fill #0

## 2019-05-22 MED FILL — GABAPENTIN 300 MG CAPSULE: 300 | 30 days supply | Qty: 360 | Fill #7

## 2019-05-22 MED FILL — CloNIDine HCL 0.1 MG TAB: 0.1 | 30 days supply | Qty: 120 | Fill #4

## 2019-06-06 MED FILL — ALPRAZolam 1 MG TABS: 1 | 90 days supply | Qty: 270 | Fill #0

## 2019-06-18 MED FILL — GABAPENTIN 300 MG CAPSULE: 300 | 30 days supply | Qty: 360 | Fill #8

## 2019-06-18 MED FILL — CloNIDine HCL 0.1 MG TAB: 0.1 | 30 days supply | Qty: 120 | Fill #5

## 2019-07-05 ENCOUNTER — Other Ambulatory Visit: Payer: Self-pay | Admitting: Internal Medicine

## 2019-07-05 DIAGNOSIS — Z1231 Encounter for screening mammogram for malignant neoplasm of breast: Secondary | ICD-10-CM

## 2019-07-13 MED FILL — GABAPENTIN 300 MG CAPSULE: 300 | 30 days supply | Qty: 360 | Fill #9

## 2019-08-13 MED FILL — CloNIDine HCL 0.1 MG TAB: 0.1 | 30 days supply | Qty: 120 | Fill #6

## 2019-08-13 MED FILL — GABAPENTIN 300 MG CAPSULE: 300 | 30 days supply | Qty: 360 | Fill #10

## 2019-08-20 MED FILL — HYDROCODON-APAP 10-325: 10-325 | 7 days supply | Qty: 21 | Fill #0

## 2019-08-25 MED FILL — HYDROCODON-APAP 10-325: 10-325 | 30 days supply | Qty: 90 | Fill #0

## 2019-09-04 MED FILL — ALPRAZolam 1 MG TABS: 1 | 90 days supply | Qty: 270 | Fill #1

## 2019-09-18 MED FILL — GABAPENTIN 300 MG CAPSULE: 300 | 30 days supply | Qty: 360 | Fill #11

## 2019-09-22 MED FILL — HYDROCODON-APAP 10-325: 10-325 | 30 days supply | Qty: 90 | Fill #0

## 2019-10-12 MED FILL — GABAPENTIN 300 MG CAPSULE: 300 | 30 days supply | Qty: 360 | Fill #12

## 2019-10-20 MED FILL — HYDROCODON-APAP 10-325: 10-325 | 30 days supply | Qty: 90 | Fill #0

## 2019-10-22 MED FILL — CloNIDine HCL 0.1 MG TAB: 0.1 | 30 days supply | Qty: 120 | Fill #7

## 2019-11-05 MED FILL — GABAPENTIN 300 MG CAPSULE: 300 | 30 days supply | Qty: 360 | Fill #0

## 2019-11-17 MED FILL — HYDROCODON-APAP 10-325: 10-325 | 30 days supply | Qty: 90 | Fill #0

## 2019-12-01 MED FILL — GABAPENTIN 300 MG CAPSULE: 300 | 30 days supply | Qty: 360 | Fill #1

## 2019-12-03 MED FILL — CloNIDine HCL 0.1 MG TAB: 0.1 | 30 days supply | Qty: 120 | Fill #8

## 2019-12-03 MED FILL — ALPRAZolam 1 MG TABS: 1 | 4 days supply | Qty: 12 | Fill #0

## 2019-12-04 MED FILL — MELOXICAM 15 MG TABLET: 15 | 30 days supply | Qty: 30 | Fill #0

## 2019-12-15 MED FILL — HYDROCODON-APAP 10-325: 10-325 | 30 days supply | Qty: 90 | Fill #0

## 2019-12-27 MED FILL — GABAPENTIN 300 MG CAPSULE: 300 | 30 days supply | Qty: 360 | Fill #0

## 2019-12-27 MED FILL — ALPRAZolam 1 MG TABS: 1 | 90 days supply | Qty: 270 | Fill #0

## 2019-12-31 MED FILL — FLUoxetine HCL 20 MG CAPS: 20 | 90 days supply | Qty: 90 | Fill #0

## 2020-01-03 MED FILL — GABAPENTIN 300 MG CAPSULE: 300 | 30 days supply | Qty: 360 | Fill #0

## 2020-01-08 MED FILL — HYDROCODON-APAP 10-325: 10-325 | 30 days supply | Qty: 120 | Fill #0

## 2020-01-16 MED FILL — CloNIDine HCL 0.1 MG TAB: 0.1 | 30 days supply | Qty: 120 | Fill #0

## 2020-01-25 MED FILL — GABAPENTIN 300 MG CAPSULE: 300 | 30 days supply | Qty: 360 | Fill #0

## 2020-02-05 MED FILL — HYDROCODON-APAP 10-325: 10-325 | 30 days supply | Qty: 120 | Fill #0

## 2020-02-19 MED FILL — CloNIDine HCL 0.1 MG TAB: 0.1 | 30 days supply | Qty: 120 | Fill #1

## 2020-02-22 MED FILL — GABAPENTIN 300 MG CAPSULE: 300 | 30 days supply | Qty: 360 | Fill #0

## 2020-03-03 MED FILL — HYDROCODON-APAP 10-325: 10-325 | 30 days supply | Qty: 120 | Fill #0

## 2020-03-21 MED FILL — CloNIDine HCL 0.1 MG TAB: 0.1 | 30 days supply | Qty: 120 | Fill #2

## 2020-03-21 MED FILL — GABAPENTIN 300 MG CAPSULE: 300 | 30 days supply | Qty: 360 | Fill #0

## 2020-03-25 MED FILL — ALPRAZolam 1 MG TABS: 1 | 30 days supply | Qty: 90 | Fill #0

## 2020-03-31 MED FILL — HYDROCODON-APAP 10-325: 10-325 | 30 days supply | Qty: 120 | Fill #0

## 2020-04-21 MED FILL — CloNIDine HCL 0.1 MG TAB: 0.1 | 30 days supply | Qty: 120 | Fill #3

## 2020-04-25 MED FILL — HYDROCODON-APAP 10-325: 10-325 | 30 days supply | Qty: 120 | Fill #0

## 2020-05-20 MED FILL — CloNIDine HCL 0.1 MG TAB: 0.1 | 30 days supply | Qty: 120 | Fill #4

## 2020-05-23 MED FILL — HYDROCODON-APAP 10-325: 10-325 | 30 days supply | Qty: 120 | Fill #0

## 2020-05-31 MED FILL — GABAPENTIN 300 MG CAPSULE: 300 | 30 days supply | Qty: 360 | Fill #1

## 2020-06-16 MED FILL — CloNIDine HCL 0.1 MG TAB: 0.1 | 30 days supply | Qty: 120 | Fill #5

## 2020-06-21 MED FILL — HYDROCODON-APAP 10-325: 10-325 | 30 days supply | Qty: 120 | Fill #0

## 2020-07-15 MED FILL — CloNIDine HCL 0.1 MG TAB: 0.1 | 30 days supply | Qty: 120 | Fill #6

## 2020-07-15 MED FILL — HYDROCODON-APAP 10-325: 10-325 | 30 days supply | Qty: 120 | Fill #0

## 2020-08-13 MED FILL — CloNIDine HCL 0.1 MG TAB: 0.1 | 30 days supply | Qty: 120 | Fill #7

## 2020-08-18 MED FILL — HYDROCODON-APAP 10-325: 10-325 | 30 days supply | Qty: 120 | Fill #0

## 2020-09-10 MED FILL — CloNIDine HCL 0.1 MG TAB: 0.1 | 30 days supply | Qty: 120 | Fill #8

## 2020-09-16 MED FILL — HYDROCODON-APAP 10-325: 10-325 | 30 days supply | Qty: 120 | Fill #0

## 2020-10-11 MED FILL — CloNIDine HCL 0.1 MG TAB: 0.1 | 30 days supply | Qty: 120 | Fill #9

## 2020-10-13 ENCOUNTER — Other Ambulatory Visit (HOSPITAL_COMMUNITY): Payer: Self-pay | Admitting: Physician Assistant

## 2020-10-14 MED FILL — HYDROCODON-APAP 10-325: 10-325 | 30 days supply | Qty: 120 | Fill #0

## 2020-11-10 ENCOUNTER — Other Ambulatory Visit (HOSPITAL_COMMUNITY): Payer: Self-pay | Admitting: Physician Assistant

## 2020-11-10 MED FILL — HYDROCODON-APAP 10-325: 10-325 | 30 days supply | Qty: 120 | Fill #0

## 2020-11-11 MED FILL — CloNIDine HCL 0.1 MG TAB: 0.1 | 30 days supply | Qty: 120 | Fill #10

## 2020-12-06 ENCOUNTER — Other Ambulatory Visit (HOSPITAL_COMMUNITY): Payer: Self-pay | Admitting: Physician Assistant

## 2020-12-06 MED FILL — CloNIDine HCL 0.1 MG TAB: 0.1 | 30 days supply | Qty: 30 | Fill #0

## 2020-12-08 ENCOUNTER — Other Ambulatory Visit (HOSPITAL_COMMUNITY): Payer: Self-pay | Admitting: Physician Assistant

## 2020-12-08 MED FILL — HYDROCODON-APAP 10-325: 10-325 | 30 days supply | Qty: 120 | Fill #0

## 2021-01-05 ENCOUNTER — Other Ambulatory Visit (HOSPITAL_COMMUNITY): Payer: Self-pay | Admitting: Physician Assistant

## 2021-01-05 MED FILL — CloNIDine HCL 0.1 MG TAB: 0.1 | 30 days supply | Qty: 30 | Fill #1

## 2021-01-06 MED FILL — HYDROCODON-APAP 10-325: 10-325 | 30 days supply | Qty: 120 | Fill #0

## 2021-02-02 ENCOUNTER — Other Ambulatory Visit (HOSPITAL_COMMUNITY): Payer: Self-pay | Admitting: Physician Assistant

## 2021-02-02 MED FILL — CloNIDine HCL 0.1 MG TAB: 0.1 | 30 days supply | Qty: 30 | Fill #2

## 2021-02-03 MED FILL — HYDROCODON-APAP 10-325: 10-325 | 30 days supply | Qty: 120 | Fill #0

## 2021-02-20 ENCOUNTER — Other Ambulatory Visit (HOSPITAL_COMMUNITY): Payer: Self-pay | Admitting: Physician Assistant

## 2021-02-20 MED FILL — CloNIDine HCL 0.1 MG TAB: 0.1 | 30 days supply | Qty: 30 | Fill #0

## 2021-03-02 ENCOUNTER — Other Ambulatory Visit (HOSPITAL_COMMUNITY): Payer: Self-pay | Admitting: Physician Assistant

## 2021-03-02 MED FILL — HYDROCODON-APAP 10-325: 10-325 | 30 days supply | Qty: 120 | Fill #0

## 2021-03-30 ENCOUNTER — Other Ambulatory Visit (HOSPITAL_COMMUNITY): Payer: Self-pay

## 2021-03-30 MED ORDER — HYDROCODONE-ACETAMINOPHEN 10-325 MG PO TABS
1.0000 | ORAL_TABLET | Freq: Four times a day (QID) | ORAL | 0 refills | Status: AC | PRN
  Filled 2021-03-30: qty 120, 30d supply, fill #0

## 2021-03-30 MED FILL — Clonidine HCl Tab 0.1 MG: ORAL | 30 days supply | Qty: 30 | Fill #0 | Status: AC

## 2021-04-27 ENCOUNTER — Other Ambulatory Visit (HOSPITAL_COMMUNITY): Payer: Self-pay

## 2021-04-27 MED ORDER — HYDROCODONE-ACETAMINOPHEN 10-325 MG PO TABS
ORAL_TABLET | ORAL | 0 refills | Status: DC
  Filled 2021-04-27: qty 120, 30d supply, fill #0

## 2021-04-27 MED FILL — Clonidine HCl Tab 0.1 MG: ORAL | 30 days supply | Qty: 30 | Fill #1 | Status: AC

## 2021-05-26 ENCOUNTER — Other Ambulatory Visit (HOSPITAL_COMMUNITY): Payer: Self-pay

## 2021-05-26 MED ORDER — HYDROCODONE-ACETAMINOPHEN 10-325 MG PO TABS
ORAL_TABLET | ORAL | 0 refills | Status: DC
  Filled 2021-05-26: qty 20, 5d supply, fill #0

## 2021-05-27 ENCOUNTER — Other Ambulatory Visit (HOSPITAL_COMMUNITY): Payer: Self-pay

## 2021-05-28 ENCOUNTER — Other Ambulatory Visit (HOSPITAL_COMMUNITY): Payer: Self-pay

## 2021-05-28 MED ORDER — CLONIDINE HCL 0.1 MG PO TABS
0.1000 mg | ORAL_TABLET | Freq: Every day | ORAL | 2 refills | Status: DC
  Filled 2021-05-28 – 2021-07-06 (×2): qty 90, 90d supply, fill #0
  Filled 2021-09-18: qty 90, 90d supply, fill #1
  Filled 2022-01-13 – 2022-02-10 (×2): qty 90, 90d supply, fill #2

## 2021-05-29 ENCOUNTER — Other Ambulatory Visit (HOSPITAL_COMMUNITY): Payer: Self-pay

## 2021-06-05 ENCOUNTER — Other Ambulatory Visit (HOSPITAL_COMMUNITY): Payer: Self-pay

## 2021-07-06 ENCOUNTER — Other Ambulatory Visit (HOSPITAL_COMMUNITY): Payer: Self-pay

## 2021-09-17 ENCOUNTER — Other Ambulatory Visit (HOSPITAL_COMMUNITY): Payer: Self-pay

## 2021-09-18 ENCOUNTER — Other Ambulatory Visit (HOSPITAL_COMMUNITY): Payer: Self-pay

## 2021-09-22 ENCOUNTER — Other Ambulatory Visit (HOSPITAL_COMMUNITY): Payer: Self-pay

## 2021-11-13 ENCOUNTER — Other Ambulatory Visit (HOSPITAL_COMMUNITY): Payer: Self-pay

## 2022-01-13 ENCOUNTER — Other Ambulatory Visit (HOSPITAL_COMMUNITY): Payer: Self-pay

## 2022-01-14 ENCOUNTER — Other Ambulatory Visit (HOSPITAL_COMMUNITY): Payer: Self-pay

## 2022-01-21 ENCOUNTER — Other Ambulatory Visit (HOSPITAL_COMMUNITY): Payer: Self-pay

## 2022-02-10 ENCOUNTER — Other Ambulatory Visit (HOSPITAL_COMMUNITY): Payer: Self-pay

## 2022-02-17 ENCOUNTER — Other Ambulatory Visit (HOSPITAL_COMMUNITY): Payer: Self-pay

## 2022-05-12 ENCOUNTER — Other Ambulatory Visit (HOSPITAL_COMMUNITY): Payer: Self-pay

## 2022-05-12 MED ORDER — CLONIDINE HCL 0.1 MG PO TABS
0.1000 mg | ORAL_TABLET | Freq: Every day | ORAL | 2 refills | Status: DC
  Filled 2022-05-12: qty 90, 90d supply, fill #0
  Filled 2022-07-15 – 2022-07-19 (×2): qty 90, 90d supply, fill #1

## 2022-07-15 ENCOUNTER — Other Ambulatory Visit (HOSPITAL_COMMUNITY): Payer: Self-pay

## 2022-07-19 ENCOUNTER — Other Ambulatory Visit (HOSPITAL_COMMUNITY): Payer: Self-pay

## 2022-08-06 ENCOUNTER — Other Ambulatory Visit (HOSPITAL_COMMUNITY): Payer: Self-pay

## 2022-09-14 ENCOUNTER — Other Ambulatory Visit (HOSPITAL_COMMUNITY): Payer: Self-pay

## 2022-09-14 MED ORDER — HYDROCODONE-ACETAMINOPHEN 10-325 MG PO TABS
1.0000 | ORAL_TABLET | ORAL | 0 refills | Status: DC
  Filled 2022-09-14: qty 136, 27d supply, fill #0
  Filled 2022-09-14: qty 150, 30d supply, fill #0
  Filled 2022-09-15: qty 51, 10d supply, fill #0
  Filled 2022-09-15: qty 99, 20d supply, fill #0

## 2022-09-15 ENCOUNTER — Other Ambulatory Visit (HOSPITAL_COMMUNITY): Payer: Self-pay

## 2022-10-13 ENCOUNTER — Telehealth: Payer: Self-pay | Admitting: *Deleted

## 2022-10-13 NOTE — Telephone Encounter (Signed)
Patient contacted the office requesting an appt to be seen for lower extremity swelling. Patient states she had a heart attack 19 years ago after giving birth. Patient states her legs are swollen now like they were at the time of her heart attack. Advised patient to contact Cardiology for management of edema. Phone number provided. Patient accepts receipt.

## 2022-11-08 ENCOUNTER — Other Ambulatory Visit (HOSPITAL_COMMUNITY): Payer: Self-pay

## 2022-11-08 MED ORDER — OXYCODONE-ACETAMINOPHEN 7.5-325 MG PO TABS
1.0000 | ORAL_TABLET | Freq: Four times a day (QID) | ORAL | 0 refills | Status: DC | PRN
  Filled 2022-11-08 – 2022-11-10 (×2): qty 120, 30d supply, fill #0

## 2022-11-09 ENCOUNTER — Other Ambulatory Visit (HOSPITAL_COMMUNITY): Payer: Self-pay

## 2022-11-10 ENCOUNTER — Other Ambulatory Visit (HOSPITAL_COMMUNITY): Payer: Self-pay

## 2022-11-16 ENCOUNTER — Other Ambulatory Visit: Payer: Self-pay | Admitting: Physician Assistant

## 2022-11-16 DIAGNOSIS — Z1231 Encounter for screening mammogram for malignant neoplasm of breast: Secondary | ICD-10-CM

## 2022-11-19 ENCOUNTER — Other Ambulatory Visit (HOSPITAL_COMMUNITY): Payer: Self-pay

## 2022-11-22 ENCOUNTER — Other Ambulatory Visit (HOSPITAL_COMMUNITY): Payer: Self-pay

## 2022-11-26 ENCOUNTER — Emergency Department (HOSPITAL_COMMUNITY): Payer: Medicaid Other

## 2022-11-26 ENCOUNTER — Inpatient Hospital Stay (HOSPITAL_COMMUNITY)
Admission: EM | Admit: 2022-11-26 | Discharge: 2022-11-30 | DRG: 871 | Disposition: A | Discharge status: Home / Self Care | Payer: Medicaid Other | Attending: Internal Medicine | Admitting: Internal Medicine

## 2022-11-26 DIAGNOSIS — Z9049 Acquired absence of other specified parts of digestive tract: Secondary | ICD-10-CM

## 2022-11-26 DIAGNOSIS — R0902 Hypoxemia: Secondary | ICD-10-CM | POA: Diagnosis present

## 2022-11-26 DIAGNOSIS — Z825 Family history of asthma and other chronic lower respiratory diseases: Secondary | ICD-10-CM

## 2022-11-26 DIAGNOSIS — G43909 Migraine, unspecified, not intractable, without status migrainosus: Secondary | ICD-10-CM | POA: Diagnosis present

## 2022-11-26 DIAGNOSIS — A419 Sepsis, unspecified organism: Principal | ICD-10-CM | POA: Diagnosis present

## 2022-11-26 DIAGNOSIS — T424X1A Poisoning by benzodiazepines, accidental (unintentional), initial encounter: Secondary | ICD-10-CM | POA: Diagnosis present

## 2022-11-26 DIAGNOSIS — N390 Urinary tract infection, site not specified: Secondary | ICD-10-CM

## 2022-11-26 DIAGNOSIS — Y92009 Unspecified place in unspecified non-institutional (private) residence as the place of occurrence of the external cause: Secondary | ICD-10-CM

## 2022-11-26 DIAGNOSIS — G894 Chronic pain syndrome: Secondary | ICD-10-CM | POA: Diagnosis present

## 2022-11-26 DIAGNOSIS — Z888 Allergy status to other drugs, medicaments and biological substances status: Secondary | ICD-10-CM

## 2022-11-26 DIAGNOSIS — T3695XA Adverse effect of unspecified systemic antibiotic, initial encounter: Secondary | ICD-10-CM | POA: Diagnosis not present

## 2022-11-26 DIAGNOSIS — Z90710 Acquired absence of both cervix and uterus: Secondary | ICD-10-CM

## 2022-11-26 DIAGNOSIS — K521 Toxic gastroenteritis and colitis: Secondary | ICD-10-CM | POA: Diagnosis not present

## 2022-11-26 DIAGNOSIS — G928 Other toxic encephalopathy: Secondary | ICD-10-CM | POA: Diagnosis present

## 2022-11-26 DIAGNOSIS — Z1152 Encounter for screening for COVID-19: Secondary | ICD-10-CM

## 2022-11-26 DIAGNOSIS — T402X1A Poisoning by other opioids, accidental (unintentional), initial encounter: Secondary | ICD-10-CM | POA: Diagnosis present

## 2022-11-26 DIAGNOSIS — Z823 Family history of stroke: Secondary | ICD-10-CM

## 2022-11-26 DIAGNOSIS — Z818 Family history of other mental and behavioral disorders: Secondary | ICD-10-CM

## 2022-11-26 DIAGNOSIS — E876 Hypokalemia: Secondary | ICD-10-CM | POA: Diagnosis present

## 2022-11-26 DIAGNOSIS — F411 Generalized anxiety disorder: Secondary | ICD-10-CM | POA: Diagnosis present

## 2022-11-26 DIAGNOSIS — K219 Gastro-esophageal reflux disease without esophagitis: Secondary | ICD-10-CM | POA: Diagnosis present

## 2022-11-26 DIAGNOSIS — F17210 Nicotine dependence, cigarettes, uncomplicated: Secondary | ICD-10-CM | POA: Diagnosis present

## 2022-11-26 DIAGNOSIS — J690 Pneumonitis due to inhalation of food and vomit: Secondary | ICD-10-CM | POA: Diagnosis present

## 2022-11-26 DIAGNOSIS — R571 Hypovolemic shock: Secondary | ICD-10-CM | POA: Diagnosis present

## 2022-11-26 DIAGNOSIS — E785 Hyperlipidemia, unspecified: Secondary | ICD-10-CM | POA: Diagnosis present

## 2022-11-26 DIAGNOSIS — R579 Shock, unspecified: Secondary | ICD-10-CM | POA: Diagnosis present

## 2022-11-26 DIAGNOSIS — Z8249 Family history of ischemic heart disease and other diseases of the circulatory system: Secondary | ICD-10-CM

## 2022-11-26 DIAGNOSIS — Z560 Unemployment, unspecified: Secondary | ICD-10-CM

## 2022-11-26 DIAGNOSIS — Z83438 Family history of other disorder of lipoprotein metabolism and other lipidemia: Secondary | ICD-10-CM

## 2022-11-26 DIAGNOSIS — J189 Pneumonia, unspecified organism: Secondary | ICD-10-CM

## 2022-11-26 LAB — CBC
HCT: 40.1 % (ref 36.0–46.0)
Hemoglobin: 12.6 g/dL (ref 12.0–15.0)
MCH: 32.6 pg (ref 26.0–34.0)
MCHC: 31.4 g/dL (ref 30.0–36.0)
MCV: 103.6 fL — ABNORMAL HIGH (ref 80.0–100.0)
Platelets: 317 10*3/uL (ref 150–400)
RBC: 3.87 MIL/uL (ref 3.87–5.11)
RDW: 15.5 % (ref 11.5–15.5)
WBC: 6.9 10*3/uL (ref 4.0–10.5)
nRBC: 0 % (ref 0.0–0.2)

## 2022-11-26 LAB — URINALYSIS, ROUTINE W REFLEX MICROSCOPIC
Bilirubin Urine: NEGATIVE
Glucose, UA: NEGATIVE mg/dL
Ketones, ur: NEGATIVE mg/dL
Nitrite: NEGATIVE
Protein, ur: 30 mg/dL — AB
Specific Gravity, Urine: 1.025 (ref 1.005–1.030)
pH: 6 (ref 5.0–8.0)

## 2022-11-26 LAB — COMPREHENSIVE METABOLIC PANEL
ALT: 51 U/L — ABNORMAL HIGH (ref 0–44)
AST: 65 U/L — ABNORMAL HIGH (ref 15–41)
Albumin: 4 g/dL (ref 3.5–5.0)
Alkaline Phosphatase: 86 U/L (ref 38–126)
Anion gap: 8 (ref 5–15)
BUN: 7 mg/dL (ref 6–20)
CO2: 26 mmol/L (ref 22–32)
Calcium: 8.6 mg/dL — ABNORMAL LOW (ref 8.9–10.3)
Chloride: 106 mmol/L (ref 98–111)
Creatinine, Ser: 0.82 mg/dL (ref 0.44–1.00)
GFR, Estimated: >60 mL/min (ref >60)
Glucose, Bld: 113 mg/dL — ABNORMAL HIGH (ref 70–99)
Potassium: 3.2 mmol/L — ABNORMAL LOW (ref 3.5–5.1)
Sodium: 140 mmol/L (ref 135–145)
Total Bilirubin: 0.5 mg/dL (ref 0.3–1.2)
Total Protein: 6.7 g/dL (ref 6.5–8.1)

## 2022-11-26 LAB — ACETAMINOPHEN LEVEL: Acetaminophen (Tylenol), Serum: <10 ug/mL — ABNORMAL LOW (ref 10–30)

## 2022-11-26 LAB — RAPID URINE DRUG SCREEN, HOSP PERFORMED
Amphetamines: NOT DETECTED
Barbiturates: NOT DETECTED
Benzodiazepines: POSITIVE — AB
Cocaine: NOT DETECTED
Opiates: POSITIVE — AB
Tetrahydrocannabinol: NOT DETECTED

## 2022-11-26 LAB — CBG MONITORING, ED: Glucose-Capillary: 92 mg/dL (ref 70–99)

## 2022-11-26 LAB — LACTIC ACID, PLASMA: Lactic Acid, Venous: 1.6 mmol/L (ref 0.5–1.9)

## 2022-11-26 LAB — ETHANOL: Alcohol, Ethyl (B): <10 mg/dL (ref <10)

## 2022-11-26 LAB — APTT: aPTT: 29 seconds (ref 24–36)

## 2022-11-26 LAB — SALICYLATE LEVEL: Salicylate Lvl: <7 mg/dL — ABNORMAL LOW (ref 7.0–30.0)

## 2022-11-26 LAB — PROTIME-INR
INR: 1.1 (ref 0.8–1.2)
Prothrombin Time: 13.6 seconds (ref 11.4–15.2)

## 2022-11-26 MED ORDER — ACETAMINOPHEN 325 MG PO TABS
650.0000 mg | ORAL_TABLET | Freq: Once | ORAL | Status: AC | PRN
  Administered 2022-11-26: 650 mg via ORAL
  Filled 2022-11-26: qty 2

## 2022-11-26 MED ORDER — LACTATED RINGERS IV BOLUS (SEPSIS)
1000.0000 mL | Freq: Once | INTRAVENOUS | Status: AC
  Administered 2022-11-27: 1000 mL via INTRAVENOUS

## 2022-11-26 MED ORDER — LACTATED RINGERS IV SOLN: INTRAVENOUS | Status: AC

## 2022-11-26 MED ORDER — LACTATED RINGERS IV BOLUS (SEPSIS)
1000.0000 mL | Freq: Once | INTRAVENOUS | Status: AC
  Administered 2022-11-26 (×2): 1000 mL via INTRAVENOUS

## 2022-11-26 MED ORDER — SODIUM CHLORIDE 0.9 % IV SOLN
500.0000 mg | INTRAVENOUS | Status: AC
  Administered 2022-11-27 – 2022-11-28 (×3): 500 mg via INTRAVENOUS
  Filled 2022-11-26 (×4): qty 5

## 2022-11-26 MED ORDER — SODIUM CHLORIDE 0.9 % IV SOLN
2.0000 g | INTRAVENOUS | Status: DC
  Administered 2022-11-26 – 2022-11-28 (×3): 2 g via INTRAVENOUS
  Filled 2022-11-26 (×3): qty 20

## 2022-11-26 NOTE — ED Triage Notes (Signed)
Pt arrives from home via [facility], per report, pt son was having a hard time waking her up. (Her son had not seen her since last weekend) On arrival, she was awake, alert to self and location, but not to her baseline She had dark black emesis with white looking substance in it. Pt family says she has a Hx of rx pain abuse and said that she has a UTI. En route, cooperative, vitals 120/70, hr 124, lung sounds clear.

## 2022-11-26 NOTE — ED Provider Notes (Addendum)
[facility]-EMERGENCY DEPT Provider Note   CSN: [id number] Arrival date & time: 11/26/22  2129     History  Chief Complaint  Patient presents with  . Altered Mental Status    [name] is a 56 y.o. female who presents via EMS after her son found her at home and was having hard time waking her up, seemed somewhat confused, had 1 episode of emesis per EMS that was reportedly dark in color.  Per family patient had history of prescription pain medication abuse and also that she has had any UTI, unclear if this has been treated.  Patient confused, unable to answer my questions to my evaluation, the 5 caveat due to patient's acuity presentation upon arrival.  I personally read medical records previous history of polysubstance use, generalized anxiety disorder, migraine, acute MI, GERD.  HPI     Home Medications Prior to Admission medications   Medication Sig Start Date End Date Taking? Authorizing Provider  acetaminophen (TYLENOL) 500 MG tablet Take 1,000 mg by mouth every 6 (six) hours as needed for moderate pain or headache.    [provider]  ALPRAZolam Prudy Feeler) 1 MG tablet Take 1 mg by mouth 4 (four) times daily.     [provider]  cloNIDine (CATAPRES) 0.1 MG tablet TAKE 1 TABLET BY MOUTH DAILY 12/06/20 12/06/21  Paschal Dopp, PA  cloNIDine (CATAPRES) 0.1 MG tablet Take 1 tablet (0.1 mg total) by mouth daily. 05/12/22     HYDROcodone-acetaminophen (NORCO) 10-325 MG tablet Take 1 tablet by mouth every 6 (six) hours as needed for moderate pain.    [provider]  HYDROcodone-acetaminophen (NORCO) 10-325 MG tablet Take 1 (one) tablet by mouth four times daily as needed 05/26/21     HYDROcodone-acetaminophen (NORCO) 10-325 MG tablet Take 1 tablet by mouth 5 times a day as needed 09/14/22     Multiple Vitamin (MULTIVITAMIN WITH MINERALS) TABS tablet Take 1 tablet by mouth daily. Reported on 03/19/2016    [provider]   oxyCODONE-acetaminophen (PERCOCET) 7.5-325 MG tablet Take 1 tablet by mouth 4 (four) times daily as needed. 11/08/22         Allergies    Imitrex [sumatriptan], Relpax [eletriptan], Topamax [topiramate], Mucinex [guaifenesin er], and Promethazine    Review of Systems   Review of Systems  Unable to perform ROS: Mental status change    Physical Exam Updated Vital Signs BP (!) 95/56   Pulse 67   Temp 100.1 F (37.8 C) (Oral)   Resp 10   SpO2 98%  Physical Exam Vitals and nursing note reviewed.  Constitutional:      Appearance: She is ill-appearing. She is not toxic-appearing or diaphoretic.  HENT:     Head: Normocephalic and atraumatic.     Nose: Nose normal.     Mouth/Throat:     Mouth: Mucous membranes are moist.     Pharynx: No oropharyngeal exudate or posterior oropharyngeal erythema.  Eyes:     General:        Right eye: No discharge.        Left eye: No discharge.     Extraocular Movements: Extraocular movements intact.     Conjunctiva/sclera: Conjunctivae normal.     Pupils: Pupils are equal, round, and reactive to light.  Cardiovascular:     Rate and Rhythm: Regular rhythm. Tachycardia present.     Pulses: Normal pulses.     Heart sounds: Normal heart sounds. No murmur heard. Pulmonary:  Effort: Pulmonary effort is normal. Tachypnea present. No respiratory distress.     Breath sounds: No stridor, decreased air movement or transmitted upper airway sounds. Examination of the right-lower field reveals rhonchi. Rhonchi present. No wheezing or rales.     Comments: Patient was hypoxic to the 80s at time of arrival, requiring 2 L of nausea nasal cannula. Chest:     Chest wall: No mass, lacerations, deformity, swelling, tenderness, crepitus or edema.  Abdominal:     General: Bowel sounds are normal. There is no distension.     Palpations: Abdomen is soft.     Tenderness: There is no abdominal tenderness. There is no right CVA tenderness, left CVA tenderness,  guarding or rebound.  Musculoskeletal:        General: No deformity.     Cervical back: Neck supple.     Right lower leg: No edema.     Left lower leg: No edema.  Skin:    General: Skin is warm.     Capillary Refill: Capillary refill takes less than 2 seconds.  Neurological:     General: No focal deficit present.     Mental Status: She is alert and oriented to person, place, and time. Mental status is at baseline.     GCS: GCS eye subscore is 4. GCS verbal subscore is 2. GCS motor subscore is 6.  Psychiatric:        Mood and Affect: Mood normal.     ED Results / Procedures / Treatments   Labs (all labs ordered are listed, but only abnormal results are displayed) Labs Reviewed  COMPREHENSIVE METABOLIC PANEL - Abnormal; Notable for the following components:      Result Value   Potassium 3.2 (*)    Glucose, Bld 113 (*)    Calcium 8.6 (*)    AST 65 (*)    ALT 51 (*)    All other components within normal limits  CBC - Abnormal; Notable for the following components:   MCV 103.6 (*)    All other components within normal limits  URINALYSIS, ROUTINE W REFLEX MICROSCOPIC - Abnormal; Notable for the following components:   Color, Urine YELLOW (*)    APPearance CLEAR (*)    Hgb urine dipstick MODERATE (*)    Protein, ur 30 (*)    Leukocytes,Ua TRACE (*)    Bacteria, UA MANY (*)    All other components within normal limits  RAPID URINE DRUG SCREEN, HOSP PERFORMED - Abnormal; Notable for the following components:   Opiates POSITIVE (*)    Benzodiazepines POSITIVE (*)    All other components within normal limits  ACETAMINOPHEN LEVEL - Abnormal; Notable for the following components:   Acetaminophen (Tylenol), Serum <10 (*)    All other components within normal limits  SALICYLATE LEVEL - Abnormal; Notable for the following components:   Salicylate Lvl <7.0 (*)    All other components within normal limits  RESP PANEL BY RT-PCR (FLU A&B, COVID) ARPGX2  CULTURE, BLOOD (ROUTINE X 2)   CULTURE, BLOOD (ROUTINE X 2)  URINE CULTURE  LACTIC ACID, PLASMA  ETHANOL  PROTIME-INR  APTT  PREGNANCY, URINE  CBG MONITORING, ED  CBG MONITORING, ED    EKG None  Radiology DG Chest Port 1 View  Result Date: 11/26/2022 CLINICAL DATA:  Cough EXAM: PORTABLE CHEST 1 VIEW COMPARISON:  07/06/2016 FINDINGS: Heart is normal size. Right lower lobe and probable left mid lung airspace disease concerning for pneumonia. No effusions. No acute bony abnormality.  IMPRESSION: Right lower lobe and probable early left mid lung airspace disease concerning for pneumonia. Electronically Signed   By: Charlett Nose M.D.   On: 11/26/2022 22:23    Procedures Procedures    Medications Ordered in ED Medications  lactated ringers infusion ( Intravenous New Bag/Given 11/27/22 0038)  cefTRIAXone (ROCEPHIN) 2 g in sodium chloride 0.9 % 100 mL IVPB (0 g Intravenous Stopped 11/27/22 0002)  azithromycin (ZITHROMAX) 500 mg in sodium chloride 0.9 % 250 mL IVPB (0 mg Intravenous Stopped 11/27/22 0126)  naloxone (NARCAN) injection 0.4 mg (0.4 mg Intravenous Given 11/27/22 0138)  norepinephrine (LEVOPHED) 4mg  in (0.016 mg/mL) premix infusion (4 mcg/min Intravenous Infusion Verify 11/27/22 0251)  acetaminophen (TYLENOL) tablet 650 mg (650 mg Oral Given 11/26/22 2314)  lactated ringers bolus 1,000 mL (0 mLs Intravenous Stopped 11/27/22 0002)    And  lactated ringers bolus 1,000 mL (0 mLs Intravenous Stopped 11/27/22 0038)  lactated ringers bolus 1,000 mL (0 mLs Intravenous Stopped 11/27/22 0120)  ketorolac (TORADOL) 15 MG/ML injection 15 mg (15 mg Intravenous Given 11/27/22 0105)  naloxone (NARCAN) injection 1 mg (1 mg Intravenous Given 11/27/22 0110)    ED Course/ Medical Decision Making/ A&P Clinical Course as of 11/27/22 14/02/23  Sat Nov 27, 2022  0029 Consult to hospitalist Dr. 0030 who is agreeable to manage her service appreciate her collaboration of care with patient. [RS]  0121 I was called to bedside by ED  RN for drop in systolic blood pressure to the 70s.  Family at the bedside endorsing the patient recent loss of loved 1, concerned she may have been abusing her narcotics at home again.  Another liter bolus on a pressure bag, patient placed in Trendelenburg, single dose of Narcan administered with significant improvement in her mental status as well as improvement in her maps back above 70.  Tachycardia after Narcan as expected, will continue to monitor.  Admitting hospitalist Dr. Joneen Roach made aware by this provider via phone. [RS]  [phone] Patient's blood pressures declining again with maps below 65.  EDP Dr. 2130 also evaluated patient at bedside.  Will begin norepinephrine.  Consult pending to intensivist for admission to the ICU. [RS]  0228 Repage to CC for consult not returned in 1 hr.  [RS]  0335 CCM page pending over 2 hours, I personally called the cell phone of the CCM APP listed in Amion, JD [id number], PA-C, who accepted to the patient their service. I appreciate his collaboration in the care of this patient.  [RS]  Suzie Portela Repeat called to critical care APP JD [id number], PA-C as it has been 3 hours and critical care has not seen this patient.  He informed me that the CCM attending is on her way to [facility] emergency department at this time.  I appreciate their collaboration in care of this patient during a very busy time in the hospital. [RS]    Clinical Course User Index [RS] [place] Sylvan, [facility], PA-C                           Medical Decision Making 56 year old female presents via EMS for altered mental status and vomiting.  Patient febrile and tachycardic time of arrival to the ED, also has hypoxia with oxygen saturation in the 80s, requiring 2 L of oxygen nasal cannula.  Abdominal exam is benign.  Pulmonary exam with rhonchi in the right lower lobe, tachycardic rate with regular rhythm on cardiac exam, patient with normal  neurovascular status in the extremities.  GCS of 12 at time of arrival.  The  differential diagnosis for AMS is extensive and includes, but is not limited to:  Drug overdose - opioids, alcohol, sedatives, antipsychotics, drug withdrawal, others Metabolic: hypoxia, hypoglycemia, hyperglycemia, hypercalcemia, hypernatremia, hyponatremia, uremia, hepatic encephalopathy, hypothyroidism, hyperthyroidism, vitamin B12 or thiamine deficiency, carbon monoxide poisoning, Wilson's disease, Lactic acidosis, DKA/HHOS Infectious: meningitis, encephalitis, bacteremia/sepsis, urinary tract infection, pneumonia, neurosyphilis Structural: Space-occupying lesion, (brain tumor, subdural hematoma, hydrocephalus,) Vascular: stroke, subarachnoid hemorrhage, coronary ischemia, hypertensive encephalopathy, CNS vasculitis, thrombotic thrombocytopenic purpura, disseminated intravascular coagulation, hyperviscosity Psychiatric: Schizophrenia, depression; Other: Seizure, hypothermia, heat stroke, ICU psychosis, dementia -"sundowning."     Amount and/or Complexity of Data Reviewed Labs: ordered.    Details: CBC without leukocytosis or anemia, CMP with hypokalemia of 3.2, transaminitis with AST/ALT 65/51.  Ethanol salicylate Tylenol levels are normal, lactic acid is normal.  INR is normal, UA with moderate hemoglobin, proteinuria, trace leukocytes, 11-20 WBCs and many bacteria.  Urine culture pending.  UDS positive for opiates and benzos.   Radiology:     Details: Chest x-ray with right lower lobe and left midlung space infiltrates concerning for pneumonia, visualized by this provider and agree with radiologist interpretation. ECG/medicine tests:     Details:   EKG with sinus tachycardia with prolonged QT, occasional PVCs.  Risk OTC drugs. Prescription drug management. Decision regarding hospitalization.    Sepsis order set activated patient's arrival, antibiotics ordered to cover for CAP, UTI will also be covered with Rocephin.  Tylenol ministered for fever and intake.  Patient require admission  to the hospital for septic presentation likely related to both UTI and pneumonia.  Unable to contact patient's family.  Consult to hospitalist Dr. Joneen Roach.  ADDENDUM: Patient subsequently became hypotensive after admission to the hospitalist, but prior to hospitalist evaluation.  Required initiation of Levophed to get her MAP above 65.  Consult to critical care medicine, JD Suzie Portela, PA-C who is excepted this patient to service.  Per CCM PA, patient to be evaluated in [facility] ED by [facility] attending physician. I appreciate their collaboration in the care of this patient. Patient is stable on of levophed at this time.  This chart was dictated using voice recognition software, Dragon. Despite the best efforts of this provider to proofread and correct errors, errors may still occur which can change documentation meaning.  Final Clinical Impression(s) / ED Diagnoses Final diagnoses:  Sepsis, due to unspecified organism, unspecified whether acute organ dysfunction present [facility])  Lower urinary tract infectious disease  Pneumonia of right lower lobe due to infectious organism    Rx / DC Orders ED Discharge Orders     None         Paris Lore, PA-C 11/27/22 0030    Brunella Wileman, Eugene Gavia, PA-C 11/27/22 0430    Sabas Sous, MD 11/27/22 [phone]

## 2022-11-26 NOTE — ED Provider Notes (Incomplete)
[facility]-EMERGENCY DEPT Provider Note   CSN: [id number] Arrival date & time: 11/26/22  2129     History {Add pertinent medical, surgical, social history, OB history to HPI:1} Chief Complaint  Patient presents with  . Altered Mental Status    [name] is a 56 y.o. female who presents via EMS after her son found her at home and was having hard time waking her up, seemed somewhat confused, had 1 episode of emesis per EMS that was reportedly dark in color.  Per family patient had history of prescription pain medication abuse and also that she has had any UTI, unclear if this has been treated.  Patient confused, unable to answer my questions to my evaluation, the 5 caveat due to patient's acuity presentation upon arrival.  I personally read medical records previous history of polysubstance use, generalized anxiety disorder, migraine, acute MI, GERD.  HPI     Home Medications Prior to Admission medications   Medication Sig Start Date End Date Taking? Authorizing Provider  acetaminophen (TYLENOL) 500 MG tablet Take 1,000 mg by mouth every 6 (six) hours as needed for moderate pain or headache.    [provider]  ALPRAZolam Prudy Feeler) 1 MG tablet Take 1 mg by mouth 4 (four) times daily.     [provider]  cloNIDine (CATAPRES) 0.1 MG tablet TAKE 1 TABLET BY MOUTH DAILY 12/06/20 12/06/21  Paschal Dopp, PA  cloNIDine (CATAPRES) 0.1 MG tablet Take 1 tablet (0.1 mg total) by mouth daily. 05/12/22     HYDROcodone-acetaminophen (NORCO) 10-325 MG tablet Take 1 tablet by mouth every 6 (six) hours as needed for moderate pain.    [provider]  HYDROcodone-acetaminophen (NORCO) 10-325 MG tablet Take 1 (one) tablet by mouth four times daily as needed 05/26/21     HYDROcodone-acetaminophen (NORCO) 10-325 MG tablet Take 1 tablet by mouth 5 times a day as needed 09/14/22     Multiple Vitamin (MULTIVITAMIN WITH MINERALS) TABS tablet Take 1 tablet  by mouth daily. Reported on 03/19/2016    [provider]  oxyCODONE-acetaminophen (PERCOCET) 7.5-325 MG tablet Take 1 tablet by mouth 4 (four) times daily as needed. 11/08/22         Allergies    Imitrex [sumatriptan], Relpax [eletriptan], Topamax [topiramate], Mucinex [guaifenesin er], and Promethazine    Review of Systems   Review of Systems  Unable to perform ROS: Mental status change    Physical Exam Updated Vital Signs BP 101/76   Pulse (!) 123   Temp (!) 101.4 F (38.6 C) (Oral)   Resp (!) 23   SpO2 95%  Physical Exam Vitals and nursing note reviewed.  Constitutional:      Appearance: She is ill-appearing. She is not toxic-appearing or diaphoretic.  HENT:     Head: Normocephalic and atraumatic.     Nose: Nose normal.     Mouth/Throat:     Mouth: Mucous membranes are moist.     Pharynx: No oropharyngeal exudate or posterior oropharyngeal erythema.  Eyes:     General:        Right eye: No discharge.        Left eye: No discharge.     Extraocular Movements: Extraocular movements intact.     Conjunctiva/sclera: Conjunctivae normal.     Pupils: Pupils are equal, round, and reactive to light.  Cardiovascular:     Rate and Rhythm: Regular rhythm. Tachycardia present.     Pulses: Normal pulses.  Heart sounds: Normal heart sounds. No murmur heard. Pulmonary:     Effort: Pulmonary effort is normal. Tachypnea present. No respiratory distress.     Breath sounds: No stridor, decreased air movement or transmitted upper airway sounds. Examination of the right-lower field reveals rhonchi. Rhonchi present. No wheezing or rales.     Comments: Patient was hypoxic to the 80s at time of arrival, requiring 2 L of nausea nasal cannula. Chest:     Chest wall: No mass, lacerations, deformity, swelling, tenderness, crepitus or edema.  Abdominal:     General: Bowel sounds are normal. There is no distension.     Palpations: Abdomen is soft.     Tenderness: There is no  abdominal tenderness. There is no right CVA tenderness, left CVA tenderness, guarding or rebound.  Musculoskeletal:        General: No deformity.     Cervical back: Neck supple.     Right lower leg: No edema.     Left lower leg: No edema.  Skin:    General: Skin is warm.     Capillary Refill: Capillary refill takes less than 2 seconds.  Neurological:     General: No focal deficit present.     Mental Status: She is alert and oriented to person, place, and time. Mental status is at baseline.     GCS: GCS eye subscore is 4. GCS verbal subscore is 2. GCS motor subscore is 6.  Psychiatric:        Mood and Affect: Mood normal.     ED Results / Procedures / Treatments   Labs (all labs ordered are listed, but only abnormal results are displayed) Labs Reviewed  COMPREHENSIVE METABOLIC PANEL - Abnormal; Notable for the following components:      Result Value   Potassium 3.2 (*)    Glucose, Bld 113 (*)    Calcium 8.6 (*)    AST 65 (*)    ALT 51 (*)    All other components within normal limits  CBC - Abnormal; Notable for the following components:   MCV 103.6 (*)    All other components within normal limits  ACETAMINOPHEN LEVEL - Abnormal; Notable for the following components:   Acetaminophen (Tylenol), Serum <10 (*)    All other components within normal limits  SALICYLATE LEVEL - Abnormal; Notable for the following components:   Salicylate Lvl <7.0 (*)    All other components within normal limits  RESP PANEL BY RT-PCR (FLU A&B, COVID) ARPGX2  CULTURE, BLOOD (ROUTINE X 2)  CULTURE, BLOOD (ROUTINE X 2)  URINE CULTURE  LACTIC ACID, PLASMA  ETHANOL  PROTIME-INR  APTT  LACTIC ACID, PLASMA  URINALYSIS, ROUTINE W REFLEX MICROSCOPIC  RAPID URINE DRUG SCREEN, HOSP PERFORMED  PREGNANCY, URINE  CBG MONITORING, ED  CBG MONITORING, ED    EKG None  Radiology DG Chest Port 1 View  Result Date: 11/26/2022 CLINICAL DATA:  Cough EXAM: PORTABLE CHEST 1 VIEW COMPARISON:  07/06/2016  FINDINGS: Heart is normal size. Right lower lobe and probable left mid lung airspace disease concerning for pneumonia. No effusions. No acute bony abnormality. IMPRESSION: Right lower lobe and probable early left mid lung airspace disease concerning for pneumonia. Electronically Signed   By: Charlett Nose M.D.   On: 11/26/2022 22:23    Procedures Procedures  {Document cardiac monitor, telemetry assessment procedure when appropriate:1}  Medications Ordered in ED Medications  lactated ringers infusion (has no administration in time range)  lactated ringers bolus 1,000 mL (1,000  mLs Intravenous New Bag/Given 11/26/22 2315)    And  lactated ringers bolus 1,000 mL (has no administration in time range)  cefTRIAXone (ROCEPHIN) 2 g in sodium chloride 0.9 % 100 mL IVPB (2 g Intravenous New Bag/Given 11/26/22 2308)  azithromycin (ZITHROMAX) 500 mg in sodium chloride 0.9 % 250 mL IVPB (has no administration in time range)  acetaminophen (TYLENOL) tablet 650 mg (650 mg Oral Given 11/26/22 2314)    ED Course/ Medical Decision Making/ A&P                           Medical Decision Making 56 year old female presents via EMS for altered mental status and vomiting.  Patient febrile and tachycardic time of arrival to the ED, also has hypoxia with oxygen saturation in the 80s, requiring 2 L of oxygen nasal cannula.  Abdominal exam is benign.  Pulmonary exam with rhonchi in the right lower lobe, tachycardic rate with regular rhythm on cardiac exam, patient with normal neurovascular status in the extremities.  GCS of 12 at time of arrival.  The differential diagnosis for AMS is extensive and includes, but is not limited to:  Drug overdose - opioids, alcohol, sedatives, antipsychotics, drug withdrawal, others Metabolic: hypoxia, hypoglycemia, hyperglycemia, hypercalcemia, hypernatremia, hyponatremia, uremia, hepatic encephalopathy, hypothyroidism, hyperthyroidism, vitamin B12 or thiamine deficiency, carbon monoxide  poisoning, Wilson's disease, Lactic acidosis, ***DKA/HHOS Infectious: meningitis, encephalitis, bacteremia/sepsis, urinary tract infection, pneumonia, neurosyphilis Structural: Space-occupying lesion, (brain tumor, subdural hematoma, hydrocephalus,) Vascular: stroke, subarachnoid hemorrhage, coronary ischemia, hypertensive encephalopathy, CNS vasculitis, thrombotic thrombocytopenic purpura, disseminated intravascular coagulation, hyperviscosity Psychiatric: Schizophrenia, depression; Other: Seizure, hypothermia, heat stroke, ICU psychosis, dementia -"sundowning."     Amount and/or Complexity of Data Reviewed Labs: ordered.    Details: CBC without leukocytosis or anemia, CMP with hypokalemia of 3.2, transaminitis with AST/ALT 65/51.  Ethanol salicylate Tylenol levels are normal, lactic acid is normal.  INR is normal, UA with moderate hemoglobin, proteinuria, trace leukocytes, 11-20 WBCs and many bacteria.  Urine culture pending.  UDS positive for opiates and benzos.   Radiology:     Details: Chest x-ray with right lower lobe and left midlung space infiltrates concerning for pneumonia, visualized by this provider and agree with radiologist interpretation. ECG/medicine tests:     Details:   EKG with sinus tachycardia with prolonged QT, occasional PVCs.  Risk OTC drugs. Prescription drug management. Decision regarding hospitalization.    Sepsis order set activated patient's arrival, antibiotics ordered to cover for CAP, UTI will also be covered with Rocephin.  Tylenol ministered for fever and intake.  Patient require admission to the hospital for septic presentation likely related to both UTI and pneumonia.  Unable to contact patient's family.  Consult to hospitalist Dr. Joneen Roach***.  This chart was dictated using voice recognition software, Dragon. Despite the best efforts of this provider to proofread and correct errors, errors may still occur which can change documentation  meaning.   {Document critical care time when appropriate:1} {Document review of labs and clinical decision tools ie heart score, Chads2Vasc2 etc:1}  {Document your independent review of radiology images, and any outside records:1} {Document your discussion with family members, caretakers, and with consultants:1} {Document social determinants of health affecting pt's care:1} {Document your decision making why or why not admission, treatments were needed:1} Final Clinical Impression(s) / ED Diagnoses Final diagnoses:  Sepsis, due to unspecified organism, unspecified whether acute organ dysfunction present [facility])    Rx / DC Orders ED Discharge Orders  None       

## 2022-11-27 ENCOUNTER — Other Ambulatory Visit: Payer: Self-pay

## 2022-11-27 ENCOUNTER — Encounter (HOSPITAL_COMMUNITY): Payer: Self-pay | Admitting: Critical Care Medicine

## 2022-11-27 DIAGNOSIS — K521 Toxic gastroenteritis and colitis: Secondary | ICD-10-CM | POA: Diagnosis not present

## 2022-11-27 DIAGNOSIS — R579 Shock, unspecified: Secondary | ICD-10-CM | POA: Diagnosis not present

## 2022-11-27 DIAGNOSIS — Z9049 Acquired absence of other specified parts of digestive tract: Secondary | ICD-10-CM | POA: Diagnosis not present

## 2022-11-27 DIAGNOSIS — T424X1A Poisoning by benzodiazepines, accidental (unintentional), initial encounter: Secondary | ICD-10-CM | POA: Diagnosis present

## 2022-11-27 DIAGNOSIS — T402X1A Poisoning by other opioids, accidental (unintentional), initial encounter: Secondary | ICD-10-CM | POA: Diagnosis present

## 2022-11-27 DIAGNOSIS — E785 Hyperlipidemia, unspecified: Secondary | ICD-10-CM | POA: Diagnosis present

## 2022-11-27 DIAGNOSIS — Z560 Unemployment, unspecified: Secondary | ICD-10-CM | POA: Diagnosis not present

## 2022-11-27 DIAGNOSIS — Y92009 Unspecified place in unspecified non-institutional (private) residence as the place of occurrence of the external cause: Secondary | ICD-10-CM | POA: Diagnosis not present

## 2022-11-27 DIAGNOSIS — Z825 Family history of asthma and other chronic lower respiratory diseases: Secondary | ICD-10-CM | POA: Diagnosis not present

## 2022-11-27 DIAGNOSIS — G43909 Migraine, unspecified, not intractable, without status migrainosus: Secondary | ICD-10-CM | POA: Diagnosis present

## 2022-11-27 DIAGNOSIS — Z83438 Family history of other disorder of lipoprotein metabolism and other lipidemia: Secondary | ICD-10-CM | POA: Diagnosis not present

## 2022-11-27 DIAGNOSIS — N390 Urinary tract infection, site not specified: Secondary | ICD-10-CM | POA: Diagnosis present

## 2022-11-27 DIAGNOSIS — F411 Generalized anxiety disorder: Secondary | ICD-10-CM | POA: Diagnosis present

## 2022-11-27 DIAGNOSIS — Z823 Family history of stroke: Secondary | ICD-10-CM | POA: Diagnosis not present

## 2022-11-27 DIAGNOSIS — F17210 Nicotine dependence, cigarettes, uncomplicated: Secondary | ICD-10-CM | POA: Diagnosis present

## 2022-11-27 DIAGNOSIS — A419 Sepsis, unspecified organism: Secondary | ICD-10-CM | POA: Diagnosis present

## 2022-11-27 DIAGNOSIS — J690 Pneumonitis due to inhalation of food and vomit: Secondary | ICD-10-CM | POA: Diagnosis present

## 2022-11-27 DIAGNOSIS — Z888 Allergy status to other drugs, medicaments and biological substances status: Secondary | ICD-10-CM | POA: Diagnosis not present

## 2022-11-27 DIAGNOSIS — Z818 Family history of other mental and behavioral disorders: Secondary | ICD-10-CM | POA: Diagnosis not present

## 2022-11-27 DIAGNOSIS — T3695XA Adverse effect of unspecified systemic antibiotic, initial encounter: Secondary | ICD-10-CM | POA: Diagnosis not present

## 2022-11-27 DIAGNOSIS — G928 Other toxic encephalopathy: Secondary | ICD-10-CM | POA: Diagnosis present

## 2022-11-27 DIAGNOSIS — Z1152 Encounter for screening for COVID-19: Secondary | ICD-10-CM | POA: Diagnosis not present

## 2022-11-27 DIAGNOSIS — K219 Gastro-esophageal reflux disease without esophagitis: Secondary | ICD-10-CM | POA: Diagnosis present

## 2022-11-27 DIAGNOSIS — R571 Hypovolemic shock: Secondary | ICD-10-CM | POA: Diagnosis present

## 2022-11-27 DIAGNOSIS — Z8249 Family history of ischemic heart disease and other diseases of the circulatory system: Secondary | ICD-10-CM | POA: Diagnosis not present

## 2022-11-27 DIAGNOSIS — G894 Chronic pain syndrome: Secondary | ICD-10-CM | POA: Diagnosis present

## 2022-11-27 DIAGNOSIS — E876 Hypokalemia: Secondary | ICD-10-CM | POA: Diagnosis present

## 2022-11-27 LAB — HEMOGLOBIN AND HEMATOCRIT, BLOOD
HCT: 34.9 % — ABNORMAL LOW (ref 36.0–46.0)
Hemoglobin: 10.6 g/dL — ABNORMAL LOW (ref 12.0–15.0)

## 2022-11-27 LAB — RESP PANEL BY RT-PCR (FLU A&B, COVID) ARPGX2
Influenza A by PCR: NEGATIVE
Influenza B by PCR: NEGATIVE
SARS Coronavirus 2 by RT PCR: NEGATIVE

## 2022-11-27 LAB — PREGNANCY, URINE: Preg Test, Ur: NEGATIVE

## 2022-11-27 LAB — MRSA NEXT GEN BY PCR, NASAL: MRSA by PCR Next Gen: NOT DETECTED

## 2022-11-27 LAB — MAGNESIUM: Magnesium: 1.7 mg/dL (ref 1.7–2.4)

## 2022-11-27 LAB — HIV ANTIBODY (ROUTINE TESTING W REFLEX): HIV Screen 4th Generation wRfx: NONREACTIVE

## 2022-11-27 LAB — STREP PNEUMONIAE URINARY ANTIGEN: Strep Pneumo Urinary Antigen: NEGATIVE

## 2022-11-27 MED ORDER — NALOXONE HCL 2 MG/2ML IJ SOSY
1.0000 mg | PREFILLED_SYRINGE | Freq: Once | INTRAMUSCULAR | Status: AC
  Administered 2022-11-27: 1 mg via INTRAVENOUS
  Filled 2022-11-27: qty 2

## 2022-11-27 MED ORDER — INFLUENZA VAC SPLIT QUAD 0.5 ML IM SUSY: 0.5000 mL | PREFILLED_SYRINGE | INTRAMUSCULAR | Status: DC | PRN

## 2022-11-27 MED ORDER — PANTOPRAZOLE SODIUM 40 MG IV SOLR
40.0000 mg | Freq: Two times a day (BID) | INTRAVENOUS | Status: DC
  Administered 2022-11-27 – 2022-11-28 (×3): 40 mg via INTRAVENOUS
  Filled 2022-11-27 (×3): qty 10

## 2022-11-27 MED ORDER — DOCUSATE SODIUM 100 MG PO CAPS: 100.0000 mg | ORAL_CAPSULE | Freq: Two times a day (BID) | ORAL | Status: DC | PRN

## 2022-11-27 MED ORDER — NOREPINEPHRINE 4 MG/250ML-% IV SOLN
INTRAVENOUS | Status: AC
  Administered 2022-11-27: 2 ug/min via INTRAVENOUS
  Filled 2022-11-27: qty 250

## 2022-11-27 MED ORDER — NALOXONE HCL 0.4 MG/ML IJ SOLN
0.4000 mg | INTRAMUSCULAR | Status: DC | PRN
  Administered 2022-11-27: 0.4 mg via INTRAVENOUS
  Filled 2022-11-27: qty 1

## 2022-11-27 MED ORDER — POLYETHYLENE GLYCOL 3350 17 G PO PACK: 17.0000 g | PACK | Freq: Every day | ORAL | Status: DC | PRN

## 2022-11-27 MED ORDER — KETOROLAC TROMETHAMINE 15 MG/ML IJ SOLN
15.0000 mg | Freq: Once | INTRAMUSCULAR | Status: AC
  Administered 2022-11-27: 15 mg via INTRAVENOUS
  Filled 2022-11-27: qty 1

## 2022-11-27 MED ORDER — ORAL CARE MOUTH RINSE: 15.0000 mL | OROMUCOSAL | Status: DC | PRN

## 2022-11-27 MED ORDER — NOREPINEPHRINE 4 MG/250ML-% IV SOLN: 0.0000 ug/min | INTRAVENOUS | Status: DC

## 2022-11-27 MED ORDER — SODIUM CHLORIDE 0.9 % IV SOLN
250.0000 mL | INTRAVENOUS | Status: DC
  Administered 2022-11-27: 250 mL via INTRAVENOUS

## 2022-11-27 MED ORDER — POTASSIUM CHLORIDE 10 MEQ/100ML IV SOLN
10.0000 meq | INTRAVENOUS | Status: AC
  Administered 2022-11-27 (×6): 10 meq via INTRAVENOUS
  Filled 2022-11-27 (×5): qty 100

## 2022-11-27 MED ORDER — PNEUMOCOCCAL VAC POLYVALENT 25 MCG/0.5ML IJ INJ: 0.5000 mL | INJECTION | INTRAMUSCULAR | Status: DC | PRN

## 2022-11-27 MED ORDER — ACETAMINOPHEN 325 MG PO TABS
650.0000 mg | ORAL_TABLET | ORAL | Status: DC | PRN
  Administered 2022-11-27 – 2022-11-28 (×2): 650 mg via ORAL
  Filled 2022-11-27 (×2): qty 2

## 2022-11-27 MED ORDER — NOREPINEPHRINE 4 MG/250ML-% IV SOLN
2.0000 ug/min | INTRAVENOUS | Status: DC
  Administered 2022-11-27: 10 ug/min via INTRAVENOUS

## 2022-11-27 MED ORDER — LACTATED RINGERS IV BOLUS
1000.0000 mL | Freq: Once | INTRAVENOUS | Status: AC
  Administered 2022-11-27: 1000 mL via INTRAVENOUS

## 2022-11-27 MED ORDER — CHLORHEXIDINE GLUCONATE CLOTH 2 % EX PADS
6.0000 | MEDICATED_PAD | Freq: Every day | CUTANEOUS | Status: DC
  Administered 2022-11-27: 6 via TOPICAL

## 2022-11-27 NOTE — H&P (Signed)
/  NAMEKERRY-ANNE MEZO, MRN:  [id number], DOB:  13-Aug-1966, LOS: 0 ADMISSION DATE:  11/26/2022, CONSULTATION DATE:  11/27/22 REFERRING MD:  edp, CHIEF COMPLAINT:  ams   History of Present Illness:  56 yo female found by family member at home difficult to arouse. Pt's son at bedside and states pt has h/o pain medication abuse and migraines but no other pmh. Pt is a poor historian at this time and while awakes has slurred speech and is very tangential. She is unable to answer ROS questioning. She is unaware of what brought her to the ed. It is unclear how long she has been altered as the son has not seen her for about 7 days.   Upon presentation pt was noted to be hypoxic to the 80's that improved with 4L. She was hypotensive req levo up to at this time. She has dried, what appears to be blood around her mouth. She states she feels better then she did when she first presented but is unclear about why she needs to be at hospital.   Pertinent  Medical History  Migraines Gad gerd  Significant Hospital Events: Including procedures, antibiotic start and stop dates in addition to other pertinent events   Admitted to hospital 12/2  Interim History / Subjective:    Objective   Blood pressure (!) 97/56, pulse 70, temperature 99.4 F (37.4 C), temperature source Oral, resp. rate 12, SpO2 99 %.        Intake/Output Summary (Last 24 hours) at 11/27/2022 0640 Last data filed at 11/27/2022 0251 Gross per 24 hour  Intake 3013.95 ml  Output --  Net 3013.95 ml   There were no vitals filed for this visit.  Examination: General: ill appearing female reclining comfortable in bed HENT: ncat, eomi, perrla, mm dry but pink, does have dried blood around mouth Lungs: rhonchi bilaterally Cardiovascular: rrr Abdomen: soft non tender, non distended Extremities: no c/c/e Neuro: arousable, but does drift back off to sleep easily, slurred speech, no focal deficits GU: deferred  Resolved Hospital  Problem list     Assessment & Plan:  Acute toxic encephalopathy:   -improving per son but not at baseline.  -will check ammonia as well -uds + for benzo and opiates -cth if persists  Shock, suspect multifactorial with hypovolemia and sepsis Sepsis 2/2 R pneumonia + UTI -titrate pressors to map >65 -empiric abx -follow cx -strep/legionella urine ag for completeness sake  Hematemesis:  -suspected, not witnessed -zofran prn -follow hgb -ppi bid -consider GI consult vs outpt f/u  Hypokalemia:  -replace -check mag  Chronic pain Migraines Generalized anxiety disorder:  -resume meds as able.    Best Practice (right click and "Reselect all SmartList Selections" daily)   Diet/type: NPO DVT prophylaxis: other GI prophylaxis: PPI Lines: N/A Foley:  N/A Code Status:  full code Last date of multidisciplinary goals of care discussion [full with pt's son]  Labs   CBC: Recent Labs  Lab 11/26/22 2205  WBC 6.9  HGB 12.6  HCT 40.1  MCV 103.6*  PLT 317    Basic Metabolic Panel: Recent Labs  Lab 11/26/22 2205  NA 140  K 3.2*  CL 106  CO2 26  GLUCOSE 113*  BUN 7  CREATININE 0.82  CALCIUM 8.6*   GFR: CrCl cannot be calculated (Unknown ideal weight.). Recent Labs  Lab 11/26/22 2205  WBC 6.9  LATICACIDVEN 1.6    Liver Function Tests: Recent Labs  Lab 11/26/22 2205  AST 65*  ALT 51*  ALKPHOS 86  BILITOT 0.5  PROT 6.7  ALBUMIN 4.0   No results for input(s): "LIPASE", "AMYLASE" in the last 168 hours. No results for input(s): "AMMONIA" in the last 168 hours.  ABG    Component Value Date/Time   HCO3 23.2 09/16/2007 1442   TCO2 26 05/21/2014 1255   ACIDBASEDEF 2.0 09/16/2007 1442     Coagulation Profile: Recent Labs  Lab 11/26/22 2205  INR 1.1    Cardiac Enzymes: No results for input(s): "CKTOTAL", "CKMB", "CKMBINDEX", "TROPONINI" in the last 168 hours.  HbA1C: Hemoglobin A1C  Date/Time Value Ref Range Status  05/05/2015 10:07 AM 5.6   Final    CBG: Recent Labs  Lab 11/26/22 2252  GLUCAP 92    Review of Systems:   As per HPI  Past Medical History:  She,  has a past medical history of Abnormal maternal glucose tolerance, complicating pregnancy, childbirth, or the puerperium, unspecified as to episode of care, Acute MI (2004), Chronic back pain, Generalized anxiety disorder, GERD (gastroesophageal reflux disease), Hyperlipidemia, Migraine, Panic disorder without agoraphobia, and Tobacco use disorder.   Surgical History:   Past Surgical History:  Procedure Laterality Date   ABDOMINAL HYSTERECTOMY  2009   ADENOIDECTOMY     BILATERAL SALPINGOOPHORECTOMY  12/11   by DrBernardo   CESAREAN SECTION  2004, 2006   CHOLECYSTECTOMY     RHINOPLASTY     TONSILLECTOMY       Social History:   reports that she has been smoking cigarettes. She has a 35.00 pack-year smoking history. She has never used smokeless tobacco. She reports that she does not drink alcohol and does not use drugs.   Family History:  Her family history includes Anxiety disorder in her brother, father, and mother; Asthma in her mother; Coronary artery disease in her father; Depression in her brother, father, and mother; Heart disease in her father and mother; Hyperlipidemia in her father; Hypertension in her brother and father; Stroke in her father.   Allergies Allergies  Allergen Reactions   Imitrex [Sumatriptan] Swelling    Throat and tongue swelling    Relpax [Eletriptan] Swelling    Throat swelling and panic attack   Topamax [Topiramate] Shortness Of Breath and Swelling   Mucinex [Guaifenesin Er] Other (See Comments)    "felt like she crawled the walls"   Promethazine      Home Medications  Prior to Admission medications   Medication Sig Start Date End Date Taking? Authorizing Provider  acetaminophen (TYLENOL) 500 MG tablet Take 1,000 mg by mouth every 6 (six) hours as needed for moderate pain or headache.    [provider]   ALPRAZolam Prudy Feeler) 1 MG tablet Take 1 mg by mouth 4 (four) times daily.     [provider]  cloNIDine (CATAPRES) 0.1 MG tablet TAKE 1 TABLET BY MOUTH DAILY 12/06/20 12/06/21  Paschal Dopp, PA  cloNIDine (CATAPRES) 0.1 MG tablet Take 1 tablet (0.1 mg total) by mouth daily. 05/12/22     HYDROcodone-acetaminophen (NORCO) 10-325 MG tablet Take 1 tablet by mouth every 6 (six) hours as needed for moderate pain.    [provider]  HYDROcodone-acetaminophen (NORCO) 10-325 MG tablet Take 1 (one) tablet by mouth four times daily as needed 05/26/21     HYDROcodone-acetaminophen (NORCO) 10-325 MG tablet Take 1 tablet by mouth 5 times a day as needed 09/14/22     Multiple Vitamin (MULTIVITAMIN WITH MINERALS) TABS tablet Take 1 tablet by mouth daily. Reported on  03/19/2016    [provider]  oxyCODONE-acetaminophen (PERCOCET) 7.5-325 MG tablet Take 1 tablet by mouth 4 (four) times daily as needed. 11/08/22        Critical care time: 40 min cc time excluding procedures.

## 2022-11-27 NOTE — Sepsis Progress Note (Signed)
Following for sepsis monitoring ?

## 2022-11-27 NOTE — ED Notes (Signed)
ED Provider at bedside. 

## 2022-11-27 NOTE — Progress Notes (Signed)
1910 patient alert able to make needs known sitter at bedside patient assisted to [facility]

## 2022-11-27 NOTE — Progress Notes (Signed)
I was notified by [facility] PD that a family member had taken out a court ordered involuntary commitment on the patient prior to arrival on the floor. Patient was not deemed to be a risk to herself upon my assessment or the evaluation completed by the provider downstairs. Patient has had appropriate behavior and has only had a little confusion about the reason that she was brought here. Provider will put in psych order for patient to be evaluated in the morning so that the IVC can be removed if patient is still deemed appropriate. Sitter assigned to patient room. Will continue to assess.

## 2022-11-27 NOTE — Progress Notes (Incomplete)
Officer Vear Clock with [employer] informed staff of IVC paperwork from court. [employer] officer served IVC paperwork to patient with this [employer] and [employer]

## 2022-11-27 NOTE — Progress Notes (Signed)
/  [name], MRN:  [id number], DOB:  [birth year], LOS: 0 ADMISSION DATE:  11/26/2022, CONSULTATION DATE:  11/27/22 REFERRING MD:  edp, CHIEF COMPLAINT:  ams   History of Present Illness:  56 yo female found by family member at home difficult to arouse. Pt's son at bedside and states pt has h/o pain medication abuse and migraines but no other pmh. Pt is a poor historian at this time and while awakes has slurred speech and is very tangential. She is unable to answer ROS questioning. She is unaware of what brought her to the ed. It is unclear how long she has been altered as the son has not seen her for about 7 days.   Upon presentation pt was noted to be hypoxic to the 80's that improved with 4L. She was hypotensive req levo up to at this time. She has dried, what appears to be blood around her mouth. She states she feels better then she did when she first presented but is unclear about why she needs to be at hospital.   Pertinent  Medical History  Migraines Gad gerd  Significant Hospital Events: Including procedures, antibiotic start and stop dates in addition to other pertinent events   Admitted to hospital 12/2  Interim History / Subjective:   No acute events since admission Patient more alert and awake now She has been weaned off levophed She complains of headache  Objective   Blood pressure 132/73, pulse 94, temperature (!) 100.9 F (38.3 C), temperature source Oral, resp. rate 20, height 5\' 5"  (1.651 m), weight 68.2 kg, SpO2 99 %.        Intake/Output Summary (Last 24 hours) at 11/27/2022 1646 Last data filed at 11/27/2022 1351 Gross per 24 hour  Intake 5035.51 ml  Output 800 ml  Net 4235.51 ml   Filed Weights   11/27/22 1326  Weight: 68.2 kg    Examination: General: lethargic appearing HENT: dry mucous membranes, PERRL Lungs: clear to auscultation bilaterally, no wheezing Cardiovascular: rrr, no murmurs Abdomen: soft non tender, non  distended Extremities: warm no edema Neuro: alert, oriented, moving all extremities GU: deferred  Resolved Hospital Problem list     Assessment & Plan:  Acute toxic encephalopathy:   -improving -uds + for benzo and opiates  Shock, suspect multifactorial with hypovolemia and sepsis Sepsis 2/2 R pneumonia + UTI -titrate pressors to map >65 -empiric abx, ceftriaxone + azithromycin -follow cx -strep/legionella urine ag for completeness sake  Hematemesis:  -suspected, not witnessed. No evidence of GI bleeding since admit -zofran prn -follow hgb -ppi bid -consider GI consult vs outpt f/u  Hypokalemia:  -repleted - monitor daily  Chronic pain Migraines Generalized anxiety disorder:  -resume meds as able.    Best Practice (right click and "Reselect all SmartList Selections" daily)   Diet/type: Regular consistency (see orders) DVT prophylaxis: other GI prophylaxis: PPI Lines: N/A Foley:  N/A Code Status:  full code Last date of multidisciplinary goals of care discussion [full with pt's son]  Labs   CBC: Recent Labs  Lab 11/26/22 2205  WBC 6.9  HGB 12.6  HCT 40.1  MCV 103.6*  PLT 317    Basic Metabolic Panel: Recent Labs  Lab 11/26/22 0653 11/26/22 2205  NA  --  140  K  --  3.2*  CL  --  106  CO2  --  26  GLUCOSE  --  113*  BUN  --  7  CREATININE  --  0.82  CALCIUM  --  8.6*  MG 1.7  --    GFR: Estimated Creatinine Clearance: 68.9 mL/min (by C-G formula based on SCr of 0.82 mg/dL). Recent Labs  Lab 11/26/22 2205  WBC 6.9  LATICACIDVEN 1.6    Liver Function Tests: Recent Labs  Lab 11/26/22 2205  AST 65*  ALT 51*  ALKPHOS 86  BILITOT 0.5  PROT 6.7  ALBUMIN 4.0   No results for input(s): "LIPASE", "AMYLASE" in the last 168 hours. No results for input(s): "AMMONIA" in the last 168 hours.  ABG    Component Value Date/Time   HCO3 23.2 09/16/2007 1442   TCO2 26 05/21/2014 1255   ACIDBASEDEF 2.0 09/16/2007 1442     Coagulation  Profile: Recent Labs  Lab 11/26/22 2205  INR 1.1    Cardiac Enzymes: No results for input(s): "CKTOTAL", "CKMB", "CKMBINDEX", "TROPONINI" in the last 168 hours.  HbA1C: Hemoglobin A1C  Date/Time Value Ref Range Status  05/05/2015 10:07 AM 5.6  Final    CBG: Recent Labs  Lab 11/26/22 2252  GLUCAP 92    Critical care time: n/a    Regina Comas, MD [facility] Office: [phone]   See Amion for personal pager PCCM on call pager [phone] until 7pm. Please call Elink 7p-7a. [phone]

## 2022-11-27 NOTE — ED Notes (Signed)
Pt is wakes to voice, briefly wakes up, able to say she is in the hospital, looks around and falls back to sleep. Will notify PA of pt current BP

## 2022-11-28 DIAGNOSIS — R579 Shock, unspecified: Secondary | ICD-10-CM | POA: Diagnosis not present

## 2022-11-28 LAB — BASIC METABOLIC PANEL
Anion gap: 7 (ref 5–15)
BUN: 9 mg/dL (ref 6–20)
CO2: 22 mmol/L (ref 22–32)
Calcium: 8.4 mg/dL — ABNORMAL LOW (ref 8.9–10.3)
Chloride: 110 mmol/L (ref 98–111)
Creatinine, Ser: 0.53 mg/dL (ref 0.44–1.00)
GFR, Estimated: >60 mL/min (ref >60)
Glucose, Bld: 95 mg/dL (ref 70–99)
Potassium: 3.4 mmol/L — ABNORMAL LOW (ref 3.5–5.1)
Sodium: 139 mmol/L (ref 135–145)

## 2022-11-28 LAB — HEPATIC FUNCTION PANEL
ALT: 40 U/L (ref 0–44)
AST: 44 U/L — ABNORMAL HIGH (ref 15–41)
Albumin: 2.8 g/dL — ABNORMAL LOW (ref 3.5–5.0)
Alkaline Phosphatase: 75 U/L (ref 38–126)
Bilirubin, Direct: 0.1 mg/dL (ref 0.0–0.2)
Indirect Bilirubin: 0.3 mg/dL (ref 0.3–0.9)
Total Bilirubin: 0.4 mg/dL (ref 0.3–1.2)
Total Protein: 5.2 g/dL — ABNORMAL LOW (ref 6.5–8.1)

## 2022-11-28 LAB — CBC
HCT: 32 % — ABNORMAL LOW (ref 36.0–46.0)
Hemoglobin: 10.4 g/dL — ABNORMAL LOW (ref 12.0–15.0)
MCH: 32.5 pg (ref 26.0–34.0)
MCHC: 32.5 g/dL (ref 30.0–36.0)
MCV: 100 fL (ref 80.0–100.0)
Platelets: 271 10*3/uL (ref 150–400)
RBC: 3.2 MIL/uL — ABNORMAL LOW (ref 3.87–5.11)
RDW: 15 % (ref 11.5–15.5)
WBC: 12.7 10*3/uL — ABNORMAL HIGH (ref 4.0–10.5)
nRBC: 0 % (ref 0.0–0.2)

## 2022-11-28 LAB — MAGNESIUM: Magnesium: 1.7 mg/dL (ref 1.7–2.4)

## 2022-11-28 MED ORDER — PANTOPRAZOLE SODIUM 40 MG PO TBEC: 40.0000 mg | DELAYED_RELEASE_TABLET | Freq: Every day | ORAL | Status: DC

## 2022-11-28 MED ORDER — FLUOXETINE HCL 10 MG PO CAPS
10.0000 mg | ORAL_CAPSULE | Freq: Every day | ORAL | Status: DC
  Administered 2022-11-29 – 2022-11-30 (×2): 10 mg via ORAL
  Filled 2022-11-28 (×2): qty 1

## 2022-11-28 MED ORDER — POTASSIUM CHLORIDE CRYS ER 20 MEQ PO TBCR
40.0000 meq | EXTENDED_RELEASE_TABLET | Freq: Once | ORAL | Status: AC
  Administered 2022-11-28: 40 meq via ORAL
  Filled 2022-11-28: qty 2

## 2022-11-28 NOTE — Consult Note (Signed)
[facility] Face-to-Face Psychiatry Consult   Reason for Consult:  ''? Overdose '' Referring Physician:  Lynden Oxford, MD Patient Identification: [name] MRN:  [id number] Principal Diagnosis: Shock (HCC) Diagnosis:  Principal Problem:   Shock (HCC) Active Problems:   ANXIETY DISORDER, GENERALIZED   Total Time spent with patient: 1 hour  Subjective:   [name] is a 56 y.o. female patient admitted with altered mental status.  HPI:  Patient is a 56 y.o. female who reports history of substance abuse, Migraine headache, generalized anxiety disorder, Panic disorder and chronic pain for which she has been seeing a pain management doctor. Per chart review, patient presents via EMS to the hospital after her son found her at home and was having hard time waking her up and seemed somewhat confused. Also, collateral information from her family revealed that patient had history of prescription pain medication abuse but has been compliant with her medications until this incidence. Today, patient is awake, alert, calm, cooperative, oriented x 4. She vehemently denied intentionally overdosed to hurt herself. But  reports that prior to the incidence she had a bad migraine episode took her prescribed medications without any relief. She then proceeded to take an old tablet of Xanax prescribed for her in 2021 to relief her anxiety. But patient reports that she knows not to combine her pain medication and Xanax but regretted her action. She reports being the caregiver for her 49 year old grandmother and would not take her life because GM needs her to be alive. She reports ongoing anxiety but denies depression, psychosis, delusions, prior history of suicide attempt and self harming thoughts. Patient denies alcohol use disorder.  Past Psychiatric History: as above  Risk to Self:  denies Risk to Others:  denies Prior Inpatient Therapy:  none reported Prior Outpatient Therapy:  yes, use to see [name] for  mental health  Past Medical History:  Past Medical History:  Diagnosis Date   Abnormal maternal glucose tolerance, complicating pregnancy, childbirth, or the puerperium, unspecified as to episode of care    Acute MI (HCC) 2004   Chronic back pain    Generalized anxiety disorder    GERD (gastroesophageal reflux disease)    Hyperlipidemia    Migraine    since age 85   Panic disorder without agoraphobia    Tobacco use disorder     Past Surgical History:  Procedure Laterality Date   ABDOMINAL HYSTERECTOMY  2009   ADENOIDECTOMY     BILATERAL SALPINGOOPHORECTOMY  12/11   by DrBernardo   CESAREAN SECTION  2004, 2006   CHOLECYSTECTOMY     RHINOPLASTY     TONSILLECTOMY     Family History:  Family History  Problem Relation Age of Onset   Coronary artery disease Father    Heart disease Father    Depression Father    Anxiety disorder Father    Hyperlipidemia Father    Hypertension Father    Stroke Father    Asthma Mother    Heart disease Mother    Depression Mother    Anxiety disorder Mother    Depression Brother    Anxiety disorder Brother    Hypertension Brother    Family Psychiatric  History:  Social History:  Social History   Substance and Sexual Activity  Alcohol Use No   Alcohol/week: 0.0 standard drinks of alcohol     Social History   Substance and Sexual Activity  Drug Use No    Social History  Socioeconomic History   Marital status: Divorced    Spouse name: Not on file   Number of children: Not on file   Years of education: Not on file   Highest education level: Not on file  Occupational History   Occupation: unemployed  Tobacco Use   Smoking status: Every Day    Packs/day: 1.00    Years: 35.00    Total pack years: 35.00    Types: Cigarettes   Smokeless tobacco: Never  Substance and Sexual Activity   Alcohol use: No    Alcohol/week: 0.0 standard drinks of alcohol   Drug use: No   Sexual activity: Not Currently    Birth control/protection:  Surgical  Other Topics Concern   Not on file  Social History Narrative   Not on file   Social Determinants of Health   Financial Resource Strain: Not on file  Food Insecurity: No Food Insecurity (11/27/2022)   Hunger Vital Sign    Worried About Running Out of Food in the Last Year: Never true    Ran Out of Food in the Last Year: Never true  Transportation Needs: No Transportation Needs (11/27/2022)   PRAPARE - Administrator, Civil Service (Medical): No    Lack of Transportation (Non-Medical): No  Physical Activity: Not on file  Stress: Not on file  Social Connections: Not on file   Additional Social History:    Allergies:   Allergies  Allergen Reactions   Imitrex [Sumatriptan] Swelling    Throat and tongue swelling    Relpax [Eletriptan] Swelling, Anxiety and Other (See Comments)    Throat swelling Panic attack   Topamax [Topiramate] Shortness Of Breath and Swelling   Mucinex [Guaifenesin Er] Other (See Comments)    "felt like she crawled the walls"   Promethazine Other (See Comments)    Drowsiness    Triptans Itching, Nausea Only, Swelling, Anxiety and Other (See Comments)    Panic attack    Labs:  Results for orders placed or performed during the hospital encounter of 11/26/22 (from the past 48 hour(s))  Comprehensive metabolic panel     Status: Abnormal   Collection Time: 11/26/22 10:05 PM  Result Value Ref Range   Sodium 140 135 - 145 mmol/L   Potassium 3.2 (L) 3.5 - 5.1 mmol/L   Chloride 106 98 - 111 mmol/L   CO2 26 22 - 32 mmol/L   Glucose, Bld 113 (H) 70 - 99 mg/dL    Comment: Glucose reference range applies only to samples taken after fasting for at least 8 hours.   BUN 7 6 - 20 mg/dL   Creatinine, Ser 9.60 0.44 - 1.00 mg/dL   Calcium 8.6 (L) 8.9 - 10.3 mg/dL   Total Protein 6.7 6.5 - 8.1 g/dL   Albumin 4.0 3.5 - 5.0 g/dL   AST 65 (H) 15 - 41 U/L   ALT 51 (H) 0 - 44 U/L   Alkaline Phosphatase 86 38 - 126 U/L   Total Bilirubin 0.5 0.3 - 1.2  mg/dL   GFR, Estimated >45 >40 mL/min    Comment: (NOTE) Calculated using the CKD-EPI Creatinine Equation (2021)    Anion gap 8 5 - 15    Comment: Performed at [facility], [address] [place], [place] [address]  CBC     Status: Abnormal   Collection Time: 11/26/22 10:05 PM  Result Value Ref Range   WBC 6.9 4.0 - 10.5 K/uL   RBC 3.87 3.87 -  5.11 MIL/uL   Hemoglobin 12.6 12.0 - 15.0 g/dL   HCT 19.1 47.8 - 29.5 %   MCV 103.6 (H) 80.0 - 100.0 fL   MCH 32.6 26.0 - 34.0 pg   MCHC 31.4 30.0 - 36.0 g/dL   RDW 62.1 30.8 - 65.7 %   Platelets 317 150 - 400 K/uL   nRBC 0.0 0.0 - 0.2 %    Comment: Performed at [facility], [address], [place], [place] [address]  Lactic acid, plasma     Status: None   Collection Time: 11/26/22 10:05 PM  Result Value Ref Range   Lactic Acid, Venous 1.6 0.5 - 1.9 mmol/L    Comment: Performed at [facility], [address], [place], [place] [address]  Ethanol     Status: None   Collection Time: 11/26/22 10:05 PM  Result Value Ref Range   Alcohol, Ethyl (B) <10 <10 mg/dL    Comment: (NOTE) Lowest detectable limit for serum alcohol is 10 mg/dL.  For medical purposes only. Performed at [facility], [address], [place], [place] [address]   Acetaminophen level     Status: Abnormal   Collection Time: 11/26/22 10:05 PM  Result Value Ref Range   Acetaminophen (Tylenol), Serum <10 (L) 10 - 30 ug/mL    Comment: (NOTE) Therapeutic concentrations vary significantly. A range of 10-30 ug/mL  may be an effective concentration for many patients. However, some  are best treated at concentrations outside of this range. Acetaminophen concentrations >150 ug/mL at 4 hours after ingestion  and >50 ug/mL at 12 hours after ingestion are often associated with  toxic reactions.  Performed at [facility], [address] [place], [place] [address]   Salicylate  level     Status: Abnormal   Collection Time: 11/26/22 10:05 PM  Result Value Ref Range   Salicylate Lvl <7.0 (L) 7.0 - 30.0 mg/dL    Comment: Performed at [facility], [address] [place], [place] [address]  Protime-INR     Status: None   Collection Time: 11/26/22 10:05 PM  Result Value Ref Range   Prothrombin Time 13.6 11.4 - 15.2 seconds   INR 1.1 0.8 - 1.2    Comment: (NOTE) INR goal varies based on device and disease states. Performed at [facility], [address], [place], [place] [address]   APTT     Status: None   Collection Time: 11/26/22 10:05 PM  Result Value Ref Range   aPTT 29 24 - 36 seconds    Comment: Performed at [facility], [address], [place], [place] [address]  CBG monitoring, ED     Status: None   Collection Time: 11/26/22 10:52 PM  Result Value Ref Range   Glucose-Capillary 92 70 - 99 mg/dL    Comment: Glucose reference range applies only to samples taken after fasting for at least 8 hours.  Blood Culture (routine x 2)     Status: None (Preliminary result)   Collection Time: 11/26/22 11:00 PM   Specimen: BLOOD  Result Value Ref Range   Specimen Description      BLOOD BLOOD LEFT HAND Performed at [facility], [address], [place], [place] [address]    Special Requests      BOTTLES DRAWN AEROBIC ONLY Blood Culture results may not be optimal due to an inadequate volume of blood received in culture bottles Performed at [facility], [address] [place], [place] [address]    Culture      NO GROWTH 1 DAY Performed at [facility], [address] [place], [place] [address]    Report Status PENDING   Blood Culture (routine x 2)     Status: None (Preliminary result)   Collection Time: 11/26/22 11:00 PM   Specimen: BLOOD  Result Value Ref Range   Specimen Description      BLOOD LEFT ANTECUBITAL Performed at [facility], [address] [place], [place] [address]    Special Requests      BOTTLES DRAWN AEROBIC AND ANAEROBIC Blood Culture adequate volume Performed at [facility], [address] [place], [place] [address]    Culture      NO GROWTH 1 DAY Performed at [facility], [address] [place], [place] [address]    Report Status PENDING   Resp Panel by RT-PCR (Flu A&B, Covid) Anterior Nasal Swab     Status: None   Collection Time: 11/26/22 11:05 PM   Specimen: Anterior Nasal Swab  Result Value Ref Range   SARS Coronavirus 2 by RT PCR NEGATIVE NEGATIVE    Comment: (NOTE) SARS-CoV-2 target nucleic acids are NOT DETECTED.  The SARS-CoV-2 RNA is generally detectable in upper respiratory specimens during the acute phase of infection. The lowest concentration of SARS-CoV-2 viral copies this assay can detect is 138 copies/mL. A negative result does not preclude SARS-Cov-2 infection and should not be used as the sole basis for treatment or other patient management decisions. A negative result may occur with  improper specimen collection/handling, submission of specimen other than nasopharyngeal swab, presence of viral mutation(s) within the areas targeted by this assay, and inadequate number of viral copies(<138 copies/mL). A negative result must be combined with clinical observations, patient history, and epidemiological information. The expected result is Negative.  Fact Sheet for Patients:  BloggerCourse.comhttps://www.fda.gov/media/152166/download  Fact Sheet for Healthcare Providers:  SeriousBroker.ithttps://www.fda.gov/media/152162/download  This test is no t yet approved or cleared by the [place] FDA and  has been authorized for detection and/or diagnosis of SARS-CoV-2 by FDA under an Emergency Use Authorization (EUA). This EUA will remain  in effect (meaning this test can be used) for the duration of the COVID-19 declaration under Section 564(b)(1) of the Act,  21 U.S.C.section 360bbb-3(b)(1), unless the authorization is terminated  or revoked sooner.       Influenza A by PCR NEGATIVE NEGATIVE   Influenza B by PCR NEGATIVE NEGATIVE    Comment: (NOTE) The Xpert Xpress SARS-CoV-2/FLU/RSV plus assay is intended as an aid in the diagnosis of influenza from Nasopharyngeal swab specimens and should not be used as a sole basis for treatment. Nasal washings and aspirates are unacceptable for Xpert Xpress SARS-CoV-2/FLU/RSV testing.  Fact Sheet for Patients: BloggerCourse.comhttps://www.fda.gov/media/152166/download  Fact Sheet for Healthcare Providers: SeriousBroker.ithttps://www.fda.gov/media/152162/download  This test is not yet approved or cleared by the [place] FDA and has been authorized for detection and/or diagnosis of SARS-CoV-2 by FDA under an Emergency Use Authorization (EUA). This EUA will remain in effect (meaning this test can be used) for the duration of the COVID-19 declaration under Section 564(b)(1) of the Act, 21 U.S.C. section 360bbb-3(b)(1), unless the authorization is terminated or revoked.  Performed at [facility], [address] [place], [place] [address]   Urinalysis, Routine w reflex microscopic Urine, Catheterized     Status: Abnormal   Collection Time: 11/26/22 11:15  PM  Result Value Ref Range   Color, Urine YELLOW (A) YELLOW   APPearance CLEAR (A) CLEAR   Specific Gravity, Urine 1.025 1.005 - 1.030   pH 6.0 5.0 - 8.0   Glucose, UA NEGATIVE NEGATIVE mg/dL   Hgb urine dipstick MODERATE (A) NEGATIVE   Bilirubin Urine NEGATIVE NEGATIVE   Ketones, ur NEGATIVE NEGATIVE mg/dL   Protein, ur 30 (A) NEGATIVE mg/dL   Nitrite NEGATIVE NEGATIVE   Leukocytes,Ua TRACE (A) NEGATIVE   RBC / HPF 0-5 0 - 5 RBC/hpf   WBC, UA 11-20 0 - 5 WBC/hpf   Bacteria, UA MANY (A) NONE SEEN   Squamous Epithelial / LPF 0-5 0 - 5    Comment: Performed at [facility], [address] [place], [place] [address]  Urine rapid  drug screen (hosp performed)     Status: Abnormal   Collection Time: 11/26/22 11:15 PM  Result Value Ref Range   Opiates POSITIVE (A) NONE DETECTED   Cocaine NONE DETECTED NONE DETECTED   Benzodiazepines POSITIVE (A) NONE DETECTED   Amphetamines NONE DETECTED NONE DETECTED   Tetrahydrocannabinol NONE DETECTED NONE DETECTED   Barbiturates NONE DETECTED NONE DETECTED    Comment: (NOTE) DRUG SCREEN FOR MEDICAL PURPOSES ONLY.  IF CONFIRMATION IS NEEDED FOR ANY PURPOSE, NOTIFY LAB WITHIN 5 DAYS.  LOWEST DETECTABLE LIMITS FOR URINE DRUG SCREEN Drug Class                     Cutoff (ng/mL) Amphetamine and metabolites    1000 Barbiturate and metabolites    200 Benzodiazepine                 200 Opiates and metabolites        300 Cocaine and metabolites        300 THC                            50 Performed at [facility], [address] [place], [place] [address]   Urine Culture     Status: Abnormal (Preliminary result)   Collection Time: 11/26/22 11:15 PM   Specimen: In/Out Cath Urine  Result Value Ref Range   Specimen Description      IN/OUT CATH URINE Performed at [facility], [address] [place], [place] [address]    Special Requests      NONE Performed at [facility], [address], [place], [place] [address]    Culture (A)     >=100,000 COLONIES/mL ESCHERICHIA COLI SUSCEPTIBILITIES TO FOLLOW Performed at [facility], [address], [place], [place] [address]    Report Status PENDING   Pregnancy, urine     Status: None   Collection Time: 11/26/22 11:15 PM  Result Value Ref Range   Preg Test, Ur NEGATIVE NEGATIVE    Comment:        THE SENSITIVITY OF THIS METHODOLOGY IS >20 mIU/mL. Performed at [facility], [address] [place], [place] [address]   Strep pneumoniae urinary antigen (not at [facility])     Status: None   Collection Time: 11/26/22 11:15 PM  Result Value Ref  Range   Strep Pneumo Urinary Antigen NEGATIVE NEGATIVE    Comment: Performed at [facility], [address], [place], [place] [address]  MRSA Next Gen by PCR, Nasal     Status: None   Collection  Time: 11/27/22  6:49 PM   Specimen: Nasal Mucosa; Nasal Swab  Result Value Ref Range   MRSA by PCR Next Gen NOT DETECTED NOT DETECTED    Comment: (NOTE) The GeneXpert MRSA Assay (FDA approved for NASAL specimens only), is one component of a comprehensive MRSA colonization surveillance program. It is not intended to diagnose MRSA infection nor to guide or monitor treatment for MRSA infections. Test performance is not FDA approved in patients less than 20 years old. Performed at [facility], [address], [place], [place] [address]   CBC     Status: Abnormal   Collection Time: 11/28/22  3:20 AM  Result Value Ref Range   WBC 12.7 (H) 4.0 - 10.5 K/uL   RBC 3.20 (L) 3.87 - 5.11 MIL/uL   Hemoglobin 10.4 (L) 12.0 - 15.0 g/dL   HCT 80.2 (L) 23.3 - 61.2 %   MCV 100.0 80.0 - 100.0 fL   MCH 32.5 26.0 - 34.0 pg   MCHC 32.5 30.0 - 36.0 g/dL   RDW 24.4 97.5 - 30.0 %   Platelets 271 150 - 400 K/uL   nRBC 0.0 0.0 - 0.2 %    Comment: Performed at [facility], [address] [place], [place] [address]  Basic metabolic panel     Status: Abnormal   Collection Time: 11/28/22  3:20 AM  Result Value Ref Range   Sodium 139 135 - 145 mmol/L   Potassium 3.4 (L) 3.5 - 5.1 mmol/L   Chloride 110 98 - 111 mmol/L   CO2 22 22 - 32 mmol/L   Glucose, Bld 95 70 - 99 mg/dL    Comment: Glucose reference range applies only to samples taken after fasting for at least 8 hours.   BUN 9 6 - 20 mg/dL   Creatinine, Ser 1.11 0.44 - 1.00 mg/dL   Calcium 8.4 (L) 8.9 - 10.3 mg/dL   GFR, Estimated >73 >56 mL/min    Comment: (NOTE) Calculated using the CKD-EPI Creatinine Equation (2021)    Anion gap 7 5 - 15    Comment: Performed at [facility], [address] [place], [place] [address]  Magnesium     Status: None   Collection Time: 11/28/22  3:20 AM  Result Value Ref Range   Magnesium 1.7 1.7 - 2.4 mg/dL    Comment: Performed at [facility], [address], [place], [place] [address]    Current Facility-Administered Medications  Medication Dose Route Frequency Provider Last Rate Last Admin   0.9 %  sodium chloride infusion  250 mL Intravenous Continuous Briant Sites, DO 20 mL/hr at 11/27/22 1900 Infusion Verify at 11/27/22 1900   acetaminophen (TYLENOL) tablet 650 mg  650 mg Oral Q4H PRN Martina Sinner, MD   650 mg at 11/28/22 0906   azithromycin (ZITHROMAX) 500 mg in sodium chloride 0.9 % 250 mL IVPB  500 mg Intravenous Q24H Briant Sites, DO 250 mL/hr at 11/27/22 2340 500 mg at 11/27/22 2340   cefTRIAXone (ROCEPHIN) 2 g in sodium chloride 0.9 % 100 mL IVPB  2 g Intravenous Q24H Sponseller, Rebekah R, PA-C 200 mL/hr at 11/27/22 2217 2 g at 11/27/22 2217   Chlorhexidine Gluconate Cloth 2 % PADS 6 each  6 each Topical Daily Briant Sites, DO   6 each at 11/27/22 1351   docusate sodium (COLACE) capsule 100 mg  100 mg Oral BID PRN Briant Sites, DO  influenza vac split quadrivalent PF (FLUARIX) injection 0.5 mL  0.5 mL Intramuscular Prior to discharge Martina Sinner, MD       naloxone [facility]) injection 0.4 mg  0.4 mg Intravenous PRN Sponseller, Rebekah R, PA-C   0.4 mg at 11/27/22 0138   Oral care mouth rinse  15 mL Mouth Rinse PRN Briant Sites, DO       [START ON 11/29/2022] pantoprazole (PROTONIX) EC tablet 40 mg  40 mg Oral Daily Rolly Salter, MD       pneumococcal 23 valent vaccine (PNEUMOVAX-23) injection 0.5 mL  0.5 mL Intramuscular Prior to discharge Martina Sinner, MD        Musculoskeletal: Strength & Muscle Tone: within normal limits Gait & Station: normal Patient leans: N/A    Psychiatric Specialty Exam:  Presentation  General Appearance:  Appropriate for  Environment  Eye Contact: Good  Speech: Clear and Coherent  Speech Volume: Normal  Handedness: Right   Mood and Affect  Mood: Anxious  Affect: Congruent   Thought Process  Thought Processes: Goal Directed; Linear  Descriptions of Associations:Intact  Orientation:Full (Time, Place and Person)  Thought Content:Logical  History of Schizophrenia/Schizoaffective disorder:No data recorded Duration of Psychotic Symptoms:No data recorded Hallucinations:Hallucinations: None  Ideas of Reference:None  Suicidal Thoughts:Suicidal Thoughts: No  Homicidal Thoughts:Homicidal Thoughts: No   Sensorium  Memory: Immediate Good; Recent Good; Remote Good  Judgment: Intact  Insight: Fair   Art therapist  Concentration: Good  Attention Span: Good  Recall: Good  Fund of Knowledge: Good  Language: Good   Psychomotor Activity  Psychomotor Activity: Psychomotor Activity: Normal   Assets  Assets: Communication Skills; Desire for Improvement; Social Support   Sleep  Sleep: Sleep: Fair   Physical Exam: Physical Exam Review of Systems  Psychiatric/Behavioral:  Positive for substance abuse. Negative for depression and suicidal ideas. The patient is nervous/anxious.    Blood pressure (!) 144/87, pulse 85, temperature 99.6 F (37.6 C), temperature source Oral, resp. rate 16, height  (1.651 m), weight 68.2 kg, SpO2 100 %. Body mass index is 25.02 kg/m.  Treatment Plan Summary: 56 y/o female with history of substance abuse, anxiety who was admitted to the hospital with altered mental status. Today, she is alert, awake , oriented x 4 and denies self harming thought. But she reports ongoing anxiety for which she is willing to be prescribed SSRI. Based on my evaluation today, patient is low risk for suicide granting the protective factors such as caring family members, love for her 37 year old grandmother and no prior history of  suicide.  Plan/Recommendations: -Start Prozac 10 mg daily for anxiety -Consider referring patient to outpatient psychiatrist upon discharge    Disposition: No evidence of imminent risk to self or others at present.   Patient does not meet criteria for psychiatric inpatient admission. Supportive therapy provided about ongoing stressors. Psychiatric service signing out. Re-consult as needed  Thedore Mins, MD 11/28/2022 2:35 PM

## 2022-11-28 NOTE — Hospital Course (Signed)
PMH of generalized anxiety disorder, panic disorder, chronic migraine, chronic pain syndrome on narcotics chronically, smoker presented to the hospital with complaints of an unresponsive event at home.  This was in the setting of patient taking more medications than prescribed per patient.  No suicidal ideation. Initial plan was to admit to hospitalist service but then became hypotensive requiring peripheral pressors and was transferred to ICU service. Blood pressure improved mentation improved and was transferred to hospitalist service on 12/3. Patient was provided IVC documentation per family/[employer].  Psychiatry was consulted, on 12/3 psychiatry recommended no indication for inpatient psychiatric admission and therefore IVC was rescinded.

## 2022-11-28 NOTE — TOC Progression Note (Signed)
Transition of Care [facility]) - Progression Note    Patient Details  Name: [name] MRN: [id number] Date of Birth: Apr 17, 1966  Transition of Care [facility]) CM/SW Contact  Adrian Prows, RN Phone Number: 11/28/2022, 4:02 PM  Clinical Narrative:    Order received to rescind IVC; MD signed rescind IVC form and faxed to [facility]; electronic confirmation received; patient resources given.        Expected Discharge Plan and Services                                                 Social Determinants of Health (SDOH) Interventions    Readmission Risk Interventions     No data to display

## 2022-11-28 NOTE — TOC Initial Note (Signed)
Transition of Care (TOC) - Initial/Assessment Note    Patient Details  Name: [name] MRN: [id number] Date of Birth: 08/31/1966  Transition of Care [facility]) CM/SW Contact:    Adrian Prows, RN Phone Number: 11/28/2022, 4:23 PM  Clinical Narrative:                 Spoke to pt in room; the pt says she lives with her grandmother and plans to return at d/c; the pt says she has transportation; the pt says she wears glasses but she does not have dentures, hearing aides, HH services; the pt also says she does not have DME; the pt says she agrees to receive resources for residential and OP substance abuse counseling; copies of resources given to pt and placed in d/c instructions; no TOC needs;TOC will con't to follow  Expected Discharge Plan: Home/Self Care Barriers to Discharge: Continued Medical Work up   Patient Goals and CMS Choice        Expected Discharge Plan and Services Expected Discharge Plan: Home/Self Care   Discharge Planning Services: CM Consult   Living arrangements for the past 2 months: Single Family Home                                      Prior Living Arrangements/Services Living arrangements for the past 2 months: Single Family Home Lives with:: Relatives Patient language and need for interpreter reviewed:: Yes Do you feel safe going back to the place where you live?: Yes      Need for Family Participation in Patient Care: Yes (Comment) Care giver support system in place?: Yes (comment)   Criminal Activity/Legal Involvement Pertinent to Current Situation/Hospitalization: No - Comment as needed  Activities of Daily Living Home Assistive Devices/Equipment: None ADL Screening (condition at time of admission) Patient's cognitive ability adequate to safely complete daily activities?: Yes Is the patient deaf or have difficulty hearing?: No Does the patient have difficulty seeing, even when wearing glasses/contacts?: No Does the patient have  difficulty concentrating, remembering, or making decisions?: No Patient able to express need for assistance with ADLs?: Yes Does the patient have difficulty dressing or bathing?: No Independently performs ADLs?: Yes (appropriate for developmental age) Does the patient have difficulty walking or climbing stairs?: No Weakness of Legs: None Weakness of Arms/Hands: None  Permission Sought/Granted Permission sought to share information with : Case Manager Permission granted to share information with : Yes, Verbal Permission Granted  Share Information with NAME: Burnard Bunting, RN, CM           Emotional Assessment Appearance:: Appears stated age Attitude/Demeanor/Rapport: Gracious Affect (typically observed): Accepting Orientation: : Oriented to Self, Oriented to Place, Oriented to  Time, Oriented to Situation   Psych Involvement: Yes (comment) (psych signed off 11/28/22)  Admission diagnosis:  Lower urinary tract infectious disease [N39.0] Shock (HCC) [R57.9] Pneumonia of right lower lobe due to infectious organism [J18.9] Sepsis, due to unspecified organism, unspecified whether acute organ dysfunction present [facility]) [A41.9] Patient Active Problem List   Diagnosis Date Noted   Shock (HCC) 11/27/2022   Vaginal bleeding 02/05/2016   Left arm weakness 05/14/2015   Chronic back pain 05/05/2015   Grief reaction 05/05/2015   Chest pain 11/02/2013   Healthcare maintenance 04/20/2011   Hyperlipidemia 04/20/2011   CIGARETTE SMOKER 02/21/2009   H/O gestational diabetes mellitus, not currently pregnant 12/11/2007   ELEVATED BP READING WITHOUT DX  HYPERTENSION 12/11/2007   PANIC ATTACK 10/17/2007   ANXIETY DISORDER, GENERALIZED 10/17/2007   Migraine with aura 10/17/2007   PCP:  [facility]:   [facility] - [place], [place] - [address] [place] [address] Phone: [phone] Fax: [phone]  [facility] [address] [place] - [address] [place] [address] Phone: [phone] Fax: [phone]  [id number] - [place], [place] - [address] [place] [address] Phone: [phone] Fax: [phone]   - [facility] [address] [place] [address] Phone: [phone] Fax: [phone]  [id number] [place], [place] - [address] AT [facility] OF [address] [place] [address] Phone: [phone] Fax: [phone]     Social Determinants of Health (SDOH) Interventions    Readmission Risk Interventions     No data to display

## 2022-11-28 NOTE — Progress Notes (Signed)
[facility] Progress Note Patient: [name] FGH:[id number] DOB: 08/07/1966 DOA: 11/26/2022  DOS: the patient was seen and examined on 11/28/2022  Brief hospital course: PMH of generalized anxiety disorder, panic disorder, chronic migraine, chronic pain syndrome on narcotics chronically, smoker presented to the hospital with complaints of an unresponsive event at home.  This was in the setting of patient taking more medications than prescribed per patient.  No suicidal ideation. Initial plan was to admit to hospitalist service but then became hypotensive requiring peripheral pressors and was transferred to ICU service. Blood pressure improved mentation improved and was transferred to hospitalist service on 12/3. Patient was provided IVC documentation per family/GPD.  Psychiatry was consulted, on 12/3 psychiatry recommended no indication for inpatient psychiatric admission and therefore IVC was rescinded. Assessment and Plan: Acute toxic encephalopathy. Accidental drug overdose. Patient presents with complaints of unresponsive event at home. UDS was positive for benzodiazepine and opiates. Patient does not have any prescription for benzodiazepine in last 1 year. She reports she had some Xanax from many years ago which she still stores in her house. Patient was also prescribed Norco 10/325 in September 2023 which she filled at [facility] but somehow also she filled the same prescription at [facility] in November 2023. Along with that patient also was able to fill Percocet at [facility] in November 2023. As she has access to more medications than was prescribed. Most likely patient took them which led to unresponsive event. We will report to her prescriber about this on Monday 12/4. I called the pharmacy on 12/3 but pharmacist was not able to discuss patient's care with me. For now mentation improving.  No further work-up.  Hypovolemic shock. Blood pressure  was significantly low. Treated with IV fluids and IV pressors. Blood pressure improving. For now we will monitor.  Sepsis, possible septic shock Patient is treated with IV antibiotics for pneumonia seen on the CT scan. This is most likely an aspiration event and situation of her unresponsiveness. Blood pressure dropped down but suspect that is mostly hypovolemic shock rather than septic shock. Regardless currently blood pressure is better. We will continue with antibiotics. Cultures are negative. Monitor.  Concern for hematemesis. Ruled out.  Hypokalemia. Repleted.  Chronic pain and migraine. General anxiety disorder. Patient was started on SSRI per psychiatry. Would not continue any narcotics on discharge. Recommended patient to have somebody else provide her pain medications although patient does not have anybody living with her and she actually takes care of her grandmother. Patient has a niece who can visit her every day and recommend her to monitor patient's prescription pill counts every day.  IVC documentation. Patient was provided IVC by [employer]/family. Psychiatry was consulted. Patient was cleared from a psychiatry point of view. Recommendation from psychiatry as per following. o evidence of imminent risk to self or others at present.   Patient does not meet criteria for psychiatric inpatient admission. -Start Prozac 10 mg daily for anxiety -Consider referring patient to outpatient psychiatrist upon discharge We will rescind IVC based on this.  Subjective: No nausea no vomiting.  No headache.  No fever no chills.  Pain well controlled.  Physical Exam: General: in mild distress;  Cardiovascular: S1 and S2 Present, no Murmur Respiratory: Normal respiratory effort, Bilateral Air entry present, right-sided crackles, no wheezes Abdomen: Bowel Sound present, Non tender  Extremities: no edema Neurology: alert and oriented to time, place, and person   Data Reviewed: I  have Reviewed nursing notes, Vitals, and  Lab results. Since last encounter, pertinent lab results CBC and BMP   . I have ordered test including CBC and BMP  .   Disposition: Status is: Inpatient Remains inpatient appropriate because: Continue treatment for pneumonia with IV antibiotics for now.  SCDs Start: 11/27/22 0654   Family Communication: No one at bedside Level of care: Progressive continue progressive care for now. Vitals:   11/28/22 0127 11/28/22 0756 11/28/22 0947 11/28/22 1332  BP: 124/77 (!) 142/83 (!) 148/96 (!) 144/87  Pulse: 98 (!) 102 (!) 104 85  Resp: 16 16 18 16   Temp: 99.2 F (37.3 C) 100.1 F (37.8 C) 99.4 F (37.4 C) 99.6 F (37.6 C)  TempSrc: Oral Oral Oral Oral  SpO2: 93% 98% 99% 100%  Weight:      Height:         Author: , MD 11/28/2022 5:50 PM  Please look on [email or web address] to find out who is on call.

## 2022-11-29 ENCOUNTER — Inpatient Hospital Stay (HOSPITAL_COMMUNITY): Payer: Medicaid Other

## 2022-11-29 DIAGNOSIS — R579 Shock, unspecified: Secondary | ICD-10-CM | POA: Diagnosis not present

## 2022-11-29 LAB — CBC WITH DIFFERENTIAL/PLATELET
Abs Immature Granulocytes: 0.06 10*3/uL (ref 0.00–0.07)
Basophils Absolute: 0 10*3/uL (ref 0.0–0.1)
Basophils Relative: 0 %
Eosinophils Absolute: 0 10*3/uL (ref 0.0–0.5)
Eosinophils Relative: 0 %
HCT: 33.4 % — ABNORMAL LOW (ref 36.0–46.0)
Hemoglobin: 11.1 g/dL — ABNORMAL LOW (ref 12.0–15.0)
Immature Granulocytes: 1 %
Lymphocytes Relative: 8 %
Lymphs Abs: 1 10*3/uL (ref 0.7–4.0)
MCH: 32.3 pg (ref 26.0–34.0)
MCHC: 33.2 g/dL (ref 30.0–36.0)
MCV: 97.1 fL (ref 80.0–100.0)
Monocytes Absolute: 0.8 10*3/uL (ref 0.1–1.0)
Monocytes Relative: 6 %
Neutro Abs: 11.1 10*3/uL — ABNORMAL HIGH (ref 1.7–7.7)
Neutrophils Relative %: 85 %
Platelets: 322 10*3/uL (ref 150–400)
RBC: 3.44 MIL/uL — ABNORMAL LOW (ref 3.87–5.11)
RDW: 15 % (ref 11.5–15.5)
WBC: 12.9 10*3/uL — ABNORMAL HIGH (ref 4.0–10.5)
nRBC: 0 % (ref 0.0–0.2)

## 2022-11-29 LAB — GASTROINTESTINAL PANEL BY PCR, STOOL (REPLACES STOOL CULTURE)

## 2022-11-29 LAB — BASIC METABOLIC PANEL
Anion gap: 11 (ref 5–15)
BUN: 8 mg/dL (ref 6–20)
CO2: 19 mmol/L — ABNORMAL LOW (ref 22–32)
Calcium: 8.6 mg/dL — ABNORMAL LOW (ref 8.9–10.3)
Chloride: 109 mmol/L (ref 98–111)
Creatinine, Ser: 0.48 mg/dL (ref 0.44–1.00)
GFR, Estimated: >60 mL/min (ref >60)
Glucose, Bld: 98 mg/dL (ref 70–99)
Potassium: 3 mmol/L — ABNORMAL LOW (ref 3.5–5.1)
Sodium: 139 mmol/L (ref 135–145)

## 2022-11-29 LAB — COMPREHENSIVE METABOLIC PANEL
ALT: 37 U/L (ref 0–44)
AST: 29 U/L (ref 15–41)
Albumin: 3.2 g/dL — ABNORMAL LOW (ref 3.5–5.0)
Alkaline Phosphatase: 73 U/L (ref 38–126)
Anion gap: 10 (ref 5–15)
BUN: 7 mg/dL (ref 6–20)
CO2: 20 mmol/L — ABNORMAL LOW (ref 22–32)
Calcium: 8.4 mg/dL — ABNORMAL LOW (ref 8.9–10.3)
Chloride: 108 mmol/L (ref 98–111)
Creatinine, Ser: 0.47 mg/dL (ref 0.44–1.00)
GFR, Estimated: >60 mL/min (ref >60)
Glucose, Bld: 106 mg/dL — ABNORMAL HIGH (ref 70–99)
Potassium: 2.9 mmol/L — ABNORMAL LOW (ref 3.5–5.1)
Sodium: 138 mmol/L (ref 135–145)
Total Bilirubin: 0.6 mg/dL (ref 0.3–1.2)
Total Protein: 6.2 g/dL — ABNORMAL LOW (ref 6.5–8.1)

## 2022-11-29 LAB — URINE CULTURE: Culture: >=100000 — AB

## 2022-11-29 LAB — C DIFFICILE (CDIFF) QUICK SCRN (NO PCR REFLEX)
C Diff antigen: NEGATIVE
C Diff interpretation: NOT DETECTED
C Diff toxin: NEGATIVE

## 2022-11-29 LAB — MAGNESIUM: Magnesium: 1.8 mg/dL (ref 1.7–2.4)

## 2022-11-29 MED ORDER — POTASSIUM CHLORIDE CRYS ER 20 MEQ PO TBCR
40.0000 meq | EXTENDED_RELEASE_TABLET | ORAL | Status: AC
  Administered 2022-11-29 (×2): 40 meq via ORAL
  Filled 2022-11-29 (×2): qty 2

## 2022-11-29 MED ORDER — HYDROXYZINE HCL 25 MG PO TABS: 25.0000 mg | ORAL_TABLET | Freq: Three times a day (TID) | ORAL | Status: DC | PRN

## 2022-11-29 MED ORDER — CEFDINIR 300 MG PO CAPS
300.0000 mg | ORAL_CAPSULE | Freq: Two times a day (BID) | ORAL | Status: DC
  Administered 2022-11-29 – 2022-11-30 (×3): 300 mg via ORAL
  Filled 2022-11-29 (×3): qty 1

## 2022-11-29 MED ORDER — NICOTINE 14 MG/24HR TD PT24
14.0000 mg | MEDICATED_PATCH | Freq: Every day | TRANSDERMAL | Status: DC
  Administered 2022-11-29 – 2022-11-30 (×2): 14 mg via TRANSDERMAL
  Filled 2022-11-29 (×2): qty 1

## 2022-11-29 MED ORDER — POTASSIUM CHLORIDE CRYS ER 20 MEQ PO TBCR
40.0000 meq | EXTENDED_RELEASE_TABLET | ORAL | Status: AC
  Administered 2022-11-29 (×2): 40 meq via ORAL
  Filled 2022-11-29: qty 2

## 2022-11-29 MED ORDER — DICYCLOMINE HCL 10 MG PO CAPS: 10.0000 mg | ORAL_CAPSULE | Freq: Three times a day (TID) | ORAL | Status: DC | PRN

## 2022-11-29 MED ORDER — SODIUM CHLORIDE 0.9 % IV SOLN
250.0000 mL | INTRAVENOUS | Status: DC
  Administered 2022-11-29: 250 mL via INTRAVENOUS

## 2022-11-29 MED ORDER — NAPROXEN 250 MG PO TABS
250.0000 mg | ORAL_TABLET | Freq: Three times a day (TID) | ORAL | Status: DC | PRN
  Administered 2022-11-29: 250 mg via ORAL
  Filled 2022-11-29: qty 1

## 2022-11-29 MED ORDER — LOPERAMIDE HCL 2 MG PO CAPS
2.0000 mg | ORAL_CAPSULE | ORAL | Status: DC | PRN
  Administered 2022-11-29: 2 mg via ORAL
  Filled 2022-11-29: qty 1

## 2022-11-29 MED ORDER — SACCHAROMYCES BOULARDII 250 MG PO CAPS
250.0000 mg | ORAL_CAPSULE | Freq: Two times a day (BID) | ORAL | Status: DC
  Administered 2022-11-29 – 2022-11-30 (×3): 250 mg via ORAL
  Filled 2022-11-29 (×3): qty 1

## 2022-11-29 MED ORDER — CHOLESTYRAMINE LIGHT 4 G PO PACK
4.0000 g | PACK | ORAL | Status: DC
  Administered 2022-11-29 – 2022-11-30 (×2): 4 g via ORAL
  Filled 2022-11-29 (×2): qty 1

## 2022-11-29 NOTE — Progress Notes (Signed)
Patient had  abdominal cramping and diarrhea through the night, more than 10 small to large Bowel movements. Notified On call provider Garner Nash NP, enteric precaution place, order in place too. Will await order for specimen collection.

## 2022-11-29 NOTE — Progress Notes (Signed)
Patient had diarrhea episodes all day, at least 1 every 53min-hr. Stool sample sent to lab per order.  Patient remains very pleasant, cooperative & A/Ox4. Her brother Gaynelle Adu came to visit & was updated with pt consent. [name] verbalized today & yesterday that she was very interested in going to inpatient rehab after her hospital stay. She does have a list of options at bedside & once medically stable for discharge she would like to explore these options.

## 2022-11-29 NOTE — Progress Notes (Signed)
[facility] Progress Note Patient: [name] [id number] DOB: 09/03/1966 DOA: 11/26/2022  DOS: the patient was seen and examined on 11/29/2022  Brief hospital course: PMH of generalized anxiety disorder, panic disorder, chronic migraine, chronic pain syndrome on narcotics chronically, smoker presented to the hospital with complaints of an unresponsive event at home.  This was in the setting of patient taking more medications than prescribed per patient.  No suicidal ideation. Initial plan was to admit to hospitalist service but then became hypotensive requiring peripheral pressors and was transferred to ICU service. Blood pressure improved mentation improved and was transferred to hospitalist service on 12/3. Patient was provided IVC documentation per family/GPD.  Psychiatry was consulted, on 12/3 psychiatry recommended no indication for inpatient psychiatric admission and therefore IVC was rescinded. Assessment and Plan: Acute toxic encephalopathy. Accidental drug overdose. Patient presents with complaints of unresponsive event at home. UDS was positive for benzodiazepine and opiates. Patient does not have any prescription for benzodiazepine in last 1 year. She reports she had some Xanax from many years ago which she still stores in her house. Patient was also prescribed Norco 10/325 in September 2023 which she filled at [facility] but somehow also she filled the same prescription at [facility] in November 2023. Along with that patient also was able to fill Percocet at [facility] in November 2023. As she has access to more medications than was prescribed. Most likely patient took them which led to unresponsive event. We will report to her prescriber about this on Monday 12/4. I called the pharmacy on 12/3 but pharmacist was not able to discuss patient's care with me. For now mentation improving.  No further work-up.  Diarrhea. Potentially antibiotic  induced. Potentially from withdrawals as well. C. difficile negative. Continue Imodium.  Severe hypokalemia. In the setting of diarrhea. Replacing aggressively. Monitor.  Hypovolemic shock. Blood pressure was significantly low. Treated with IV fluids and IV pressors. Blood pressure improving. For now we will monitor.  Sepsis, possible septic shock Patient is treated with IV antibiotics for pneumonia seen on the CT scan. This is most likely an aspiration event and situation of her unresponsiveness. Blood pressure dropped down but suspect that is mostly hypovolemic shock rather than septic shock. Regardless currently blood pressure is better. We will continue with antibiotics. Cultures are negative. Monitor.  Concern for hematemesis. Ruled out.  Hypokalemia. Repleted.  Chronic pain and migraine. General anxiety disorder. Patient was started on SSRI per psychiatry. Would not continue any narcotics on discharge. Recommended patient to have somebody else provide her pain medications although patient does not have anybody living with her and she actually takes care of her grandmother. Patient has a niece who can visit her every day and recommend her to monitor patient's prescription pill counts every day.  IVC documentation. Patient was provided IVC by [employer]/family. Psychiatry was consulted. Patient was cleared from a psychiatry point of view. Recommendation from psychiatry as per following. o evidence of imminent risk to self or others at present.   Patient does not meet criteria for psychiatric inpatient admission. -Start Prozac 10 mg daily for anxiety -Consider referring patient to outpatient psychiatrist upon discharge We will rescind IVC based on this.  Subjective: No nausea, manage reports abdominal cramp.  Hide multiple episodes of diarrhea overnight.  No fever no chills.  Pain not present right now.  Physical Exam: General: Appear in mild distress; Cardiovascular:  S1 and S2 Present, no Murmur, Respiratory: good respiratory effort, Bilateral Air entry present,  right basal crackles, no wheezes Abdomen: Bowel Sound present, Non tender  Extremities: no Pedal edema Neurology: alert and oriented to time, place, and person   Data Reviewed: I have Reviewed nursing notes, Vitals, and Lab results. Since last encounter, pertinent lab results CBC and BMP   . I have ordered test including CBC and BMP  . I have discussed pt's care plan and test results with CMO Dr. Ninetta Lights  .    Disposition: Status is: Inpatient Remains inpatient appropriate because: Monitor for improvement in diarrhea.  SCDs Start: 11/27/22 0654   Family Communication: No one at bedside Level of care: Med-Surg switch to MedSurg.  Discontinue telemetry. Vitals:   11/28/22 1332 11/28/22 2029 11/29/22 0415 11/29/22 1359  BP: (!) 144/87 (!) 145/82 (!) 152/91 (!) 140/82  Pulse: 85 83 70 62  Resp: 16 20 (!) 22 17  Temp: 99.6 F (37.6 C) 99.8 F (37.7 C) 98.7 F (37.1 C) 98.3 F (36.8 C)  TempSrc: Oral Oral Oral Oral  SpO2: 100% 97% 97% 96%  Weight:      Height:         Author: Lynden Oxford, MD 11/29/2022 7:04 PM  Please look on [email or web address] to find out who is on call.

## 2022-11-30 ENCOUNTER — Other Ambulatory Visit (HOSPITAL_COMMUNITY): Payer: Self-pay

## 2022-11-30 DIAGNOSIS — R579 Shock, unspecified: Secondary | ICD-10-CM | POA: Diagnosis not present

## 2022-11-30 LAB — BASIC METABOLIC PANEL
Anion gap: 10 (ref 5–15)
BUN: 12 mg/dL (ref 6–20)
CO2: 16 mmol/L — ABNORMAL LOW (ref 22–32)
Calcium: 8.5 mg/dL — ABNORMAL LOW (ref 8.9–10.3)
Chloride: 112 mmol/L — ABNORMAL HIGH (ref 98–111)
Creatinine, Ser: 0.47 mg/dL (ref 0.44–1.00)
GFR, Estimated: >60 mL/min (ref >60)
Glucose, Bld: 94 mg/dL (ref 70–99)
Potassium: 4.1 mmol/L (ref 3.5–5.1)
Sodium: 138 mmol/L (ref 135–145)

## 2022-11-30 LAB — CBC
HCT: 36 % (ref 36.0–46.0)
Hemoglobin: 11.9 g/dL — ABNORMAL LOW (ref 12.0–15.0)
MCH: 32.2 pg (ref 26.0–34.0)
MCHC: 33.1 g/dL (ref 30.0–36.0)
MCV: 97.3 fL (ref 80.0–100.0)
Platelets: 366 10*3/uL (ref 150–400)
RBC: 3.7 MIL/uL — ABNORMAL LOW (ref 3.87–5.11)
RDW: 15 % (ref 11.5–15.5)
WBC: 8.8 10*3/uL (ref 4.0–10.5)
nRBC: 0 % (ref 0.0–0.2)

## 2022-11-30 LAB — LEGIONELLA PNEUMOPHILA SEROGP 1 UR AG: L. pneumophila Serogp 1 Ur Ag: NEGATIVE

## 2022-11-30 LAB — MAGNESIUM: Magnesium: 2 mg/dL (ref 1.7–2.4)

## 2022-11-30 MED ORDER — CEFDINIR 300 MG PO CAPS
300.0000 mg | ORAL_CAPSULE | Freq: Two times a day (BID) | ORAL | 0 refills | Status: AC
  Filled 2022-11-30: qty 4, 2d supply, fill #0

## 2022-11-30 MED ORDER — NICOTINE 14 MG/24HR TD PT24
14.0000 mg | MEDICATED_PATCH | Freq: Every day | TRANSDERMAL | 0 refills | Status: AC
  Filled 2022-11-30: qty 28, 28d supply, fill #0

## 2022-11-30 MED ORDER — DULOXETINE HCL 20 MG PO CPEP
20.0000 mg | ORAL_CAPSULE | Freq: Every day | ORAL | 2 refills | Status: DC
  Filled 2022-11-30: qty 30, 30d supply, fill #0
  Filled 2023-01-20 (×2): qty 30, 30d supply, fill #1

## 2022-11-30 MED ORDER — DICYCLOMINE HCL 10 MG PO CAPS
10.0000 mg | ORAL_CAPSULE | Freq: Three times a day (TID) | ORAL | 0 refills | Status: AC | PRN
  Filled 2022-11-30: qty 30, 10d supply, fill #0

## 2022-11-30 MED ORDER — NAPROXEN 250 MG PO TABS
250.0000 mg | ORAL_TABLET | Freq: Three times a day (TID) | ORAL | 0 refills | Status: DC | PRN
  Filled 2022-11-30: qty 30, 10d supply, fill #0

## 2022-11-30 MED ORDER — HYDROXYZINE HCL 25 MG PO TABS
25.0000 mg | ORAL_TABLET | Freq: Three times a day (TID) | ORAL | 0 refills | Status: AC | PRN
  Filled 2022-11-30: qty 30, 10d supply, fill #0

## 2022-11-30 MED ORDER — LOPERAMIDE HCL 2 MG PO CAPS
2.0000 mg | ORAL_CAPSULE | ORAL | 0 refills | Status: AC | PRN
  Filled 2022-11-30: qty 30, fill #0

## 2022-11-30 MED ORDER — NALOXONE HCL 4 MG/0.1ML NA LIQD
1.0000 | Freq: Once | NASAL | 0 refills | Status: AC
  Filled 2022-11-30: qty 2, 2d supply, fill #0

## 2022-11-30 MED ORDER — ENSURE ENLIVE PO LIQD: 237.0000 mL | Freq: Two times a day (BID) | ORAL | Status: DC

## 2022-11-30 NOTE — TOC Progression Note (Signed)
Transition of Care [facility]) - Progression Note    Patient Details  Name: [name] MRN: [id number] Date of Birth: [birth year]  Transition of Care [facility]) CM/SW Contact  Golda Acre, RN Phone Number: 11/30/2022, 12:57 PM  Clinical Narrative:    List of homeless shelters and resources for substance abuse given and reviewed with patient.  Will need a bus pass when dcd.   Expected Discharge Plan: Home/Self Care Barriers to Discharge: Continued Medical Work up  Expected Discharge Plan and Services Expected Discharge Plan: Home/Self Care   Discharge Planning Services: CM Consult   Living arrangements for the past 2 months: Single Family Home                                       Social Determinants of Health (SDOH) Interventions    Readmission Risk Interventions   No data to display

## 2022-11-30 NOTE — Discharge Summary (Signed)
Physician Discharge Summary   Patient: [name] MRN: [id number] DOB: November 10, 1966  Admit date:     11/26/2022  Discharge date: 11/30/2022  Discharge Physician: Berle Mull  PCP: [facility] [facility]  Recommendations at discharge:  Follow up with PCP in 1 week   [place]. Schedule an appointment as soon as possible for a visit in 1 week(s).   Contact information: [place] [phone]                Discharge Diagnoses: Principal Problem:   Shock [facility]) Active Problems:   ANXIETY DISORDER, GENERALIZED  Hospital Course: PMH of generalized anxiety disorder, panic disorder, chronic migraine, chronic pain syndrome on narcotics chronically, smoker presented to the hospital with complaints of an unresponsive event at home.  This was in the setting of patient taking more medications than prescribed per patient.  No suicidal ideation. Initial plan was to admit to hospitalist service but then became hypotensive requiring peripheral pressors and was transferred to ICU service. Blood pressure improved mentation improved and was transferred to hospitalist service on 12/3. Patient was provided IVC documentation per family/GPD.  Psychiatry was consulted, on 12/3 psychiatry recommended no indication for inpatient psychiatric admission and therefore IVC was rescinded.  Assessment and Plan  Acute toxic encephalopathy. Accidental drug overdose. Patient presents with complaints of unresponsive event at home. UDS was positive for benzodiazepine and opiates. Patient does not have any prescription for benzodiazepine in last 1 year. She reports she had some Xanax from many years ago which she still stores in her house. Patient was also prescribed Norco 10/325 in September 2023 which she filled at [facility] but somehow also she filled the same prescription at [facility] in November  2023. Along with that patient also was able to fill Percocet at [facility] in November 2023. Thus she has access to more medications than was prescribed. Most likely patient took them which led to unresponsive event. I reported this to her prescriber about this as well as notified her pharmacy about it. For now I suggested that the pt has only access to 2 pills a day and rest of the pills should stay with her cousin. Pt contracted for this and is willing to work with that plan.  Unfortunately pt will not be able to return to her grandmother;s home and no other family member willing to help for stay. TOC provided list of shelter.    Diarrhea. Potentially antibiotic induced. Potentially from withdrawals as well. C. difficile negative. Resolved, Continue Imodium.   Severe hypokalemia. In the setting of diarrhea. Replaced and now stable.    Hypovolemic shock. Blood pressure was significantly low. Treated with IV fluids and IV pressors. Blood pressure improving and now stable.   Sepsis, possible septic shock Patient is treated with IV antibiotics for pneumonia seen on the CT scan. This is most likely an aspiration event and situation of her unresponsiveness. Blood pressure dropped down but suspect that is mostly hypovolemic shock rather than septic shock. Regardless currently blood pressure is better. We will continue with antibiotics. Cultures are negative. Monitor.   Concern for hematemesis. Ruled out.   Hypokalemia. Repleted.   Chronic pain and migraine. General anxiety disorder. Patient was started on SSRI per psychiatry. Would not continue any narcotics on discharge. 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 Patient has a niece who can visit her every day and recommend her to monitor patient's prescription pill counts every day.   IVC  documentation. Patient was provided IVC by GPD/family. Psychiatry was consulted. Patient was cleared from a psychiatry point of view. Recommendation from psychiatry as per following. o evidence of imminent risk to self or others at present.   Patient does not meet criteria for psychiatric inpatient admission. -Start Prozac 10 mg daily for anxiety -Consider referring patient to outpatient psychiatrist upon discharge We will rescind IVC based on this.    Pain control - [employer] Controlled Substance Reporting System database was reviewed. and patient was instructed, not to drive, operate heavy machinery, perform activities at heights, swimming or participation in water activities or provide baby-sitting services while on Pain, Sleep and Anxiety Medications; until their outpatient Physician has advised to do so again. Also recommended to not to take more than prescribed Pain, Sleep and Anxiety Medications.  Consultants:  Psychiatry  PCCM   Procedures performed:  none  DISCHARGE MEDICATION: Allergies as of 11/30/2022       Reactions   Imitrex [sumatriptan] Swelling   Throat and tongue swelling    Relpax [eletriptan] Swelling, Anxiety, Other (See Comments)   Throat swelling Panic attack   Topamax [topiramate] Shortness Of Breath, Swelling   Mucinex [guaifenesin Er] Other (See Comments)   "felt like she crawled the walls"   Promethazine Other (See Comments)   Drowsiness    Triptans Itching, Nausea Only, Swelling, Anxiety, Other (See Comments)   Panic attack        Medication List     STOP taking these medications    ALPRAZolam 1 MG tablet Commonly known as: XANAX   cloNIDine 0.1 MG tablet Commonly known as: CATAPRES   gabapentin 300 MG capsule Commonly known as: NEURONTIN   HYDROcodone-acetaminophen 10-325 MG tablet Commonly known as: NORCO   oxyCODONE-acetaminophen 7.5-325 MG tablet Commonly known as: PERCOCET       TAKE these medications    cefdinir  300 MG capsule Commonly known as: OMNICEF Take 1 capsule (300 mg total) by mouth 2 (two) times daily for 2 days.   dicyclomine 10 MG capsule Commonly known as: BENTYL Take 1 capsule (10 mg total) by mouth every 8 (eight) hours as needed for spasms (abdominal cramps).   DULoxetine 20 MG capsule Commonly known as: Cymbalta Take 1 capsule (20 mg total) by mouth daily.   hydrOXYzine 25 MG tablet Commonly known as: ATARAX Take 1 tablet (25 mg total) by mouth 3 (three) times daily as needed for itching or anxiety.   loperamide 2 MG capsule Commonly known as: IMODIUM Take 1 capsule (2 mg total) by mouth as needed for diarrhea or loose stools.   multivitamin with minerals Tabs tablet Take 1 tablet by mouth in the morning.   naloxone 4 MG/0.1ML Liqd nasal spray kit Commonly known as: NARCAN Place 1 spray into the nose once for 1 dose.   naproxen 250 MG tablet Commonly known as: NAPROSYN Take 1 tablet (250 mg total) by mouth 3 (three) times daily as needed for moderate pain or headache.   nicotine 14 mg/24hr patch Commonly known as: NICODERM CQ - dosed in mg/24 hours Place 1 patch (14 mg total) onto the skin daily. Start taking on: December 01, 2022       Disposition: Home Diet recommendation: Regular diet  Discharge Exam: Vitals:   11/29/22 1359 11/29/22 2019 11/30/22 0432 11/30/22 1249  BP: Marland Kitchen)  140/82 (!) 156/91 (!) 158/92 (!) 168/91  Pulse: 62 (!) 58 64 (!) 59  Resp: _0 Temp: 98.3 F (36.8 C) 98.4 F (36.9 C) 98.6 F (37 C) 97.6 F (36.4 C)  TempSrc: Oral Oral Oral Oral  SpO2: 96% 98% 99% 98%  Weight:      Height:       General: Appear in no distress; no visible Abnormal Neck Mass Or lumps, Conjunctiva normal Cardiovascular: S1 and S2 Present, no Murmur, Respiratory: good respiratory effort, Bilateral Air entry present and CTA, no Crackles, no wheezes Abdomen: Bowel Sound present, Non tender  Extremities: no Pedal edema Neurology: alert and oriented to  time, place, and person  Filed Weights   11/27/22 1326  Weight: 68.2 kg   Condition at discharge: stable  The results of significant diagnostics from this hospitalization (including imaging, microbiology, ancillary and laboratory) are listed below for reference.   Imaging Studies: DG Abd Portable 1V  Result Date: 11/29/2022 CLINICAL DATA:  Abdominal discomfort. EXAM: PORTABLE ABDOMEN - 1 VIEW COMPARISON:  08/17/2006. FINDINGS: Bibasilar atelectasis or infiltrate noted. No gaseous bowel dilatation to suggest obstruction. Surgical clips noted right upper quadrant with phleboliths seen along the pelvic sidewall bilaterally. Visualized bony anatomy unremarkable. IMPRESSION: Bibasilar atelectasis or infiltrate. No gaseous bowel dilatation to suggest obstruction. Electronically Signed   By: Misty Stanley M.D.   On: 11/29/2022 08:28   DG Chest Port 1 View  Result Date: 11/26/2022 CLINICAL DATA:  Cough EXAM: PORTABLE CHEST 1 VIEW COMPARISON:  07/06/2016 FINDINGS: Heart is normal size. Right lower lobe and probable left mid lung airspace disease concerning for pneumonia. No effusions. No acute bony abnormality. IMPRESSION: Right lower lobe and probable early left mid lung airspace disease concerning for pneumonia. Electronically Signed   By: Rolm Baptise M.D.   On: 11/26/2022 22:23    Microbiology: Results for orders placed or performed during the hospital encounter of 11/26/22  Blood Culture (routine x 2)     Status: None (Preliminary result)   Collection Time: 11/26/22 11:00 PM   Specimen: BLOOD  Result Value Ref Range Status   Specimen Description   Final    BLOOD BLOOD LEFT HAND Performed at [facility] [address], [place], [place] [address]    Special Requests   Final    BOTTLES DRAWN AEROBIC ONLY Blood Culture results may not be optimal due to an inadequate volume of blood received in culture bottles Performed at [facility] [address], [place], [place] [address]    Culture   Final    NO GROWTH 3 DAYS Performed at [facility], [place] [address], [place], [place] [address]    Report Status PENDING  Incomplete  Blood Culture (routine x 2)     Status: None (Preliminary result)   Collection Time: 11/26/22 11:00 PM   Specimen: BLOOD  Result Value Ref Range Status   Specimen Description   Final    BLOOD LEFT ANTECUBITAL Performed at [facility] [address] [place], [place] [address]    Special Requests   Final    BOTTLES DRAWN AEROBIC AND ANAEROBIC Blood Culture adequate volume Performed at [facility] [address] [place], [place] [address]    Culture   Final    NO GROWTH 3 DAYS Performed at [facility], [place] [address] [place], [place] [address]    Report Status PENDING  Incomplete  Resp  Panel by RT-PCR (Flu A&B, Covid) Anterior Nasal Swab     Status: None   Collection Time: 11/26/22 11:05 PM   Specimen: Anterior Nasal Swab  Result Value Ref Range Status   SARS Coronavirus 2 by RT PCR NEGATIVE NEGATIVE Final    Comment: (NOTE) SARS-CoV-2 target nucleic acids are NOT DETECTED.  The SARS-CoV-2 RNA is generally detectable in upper respiratory specimens during the acute phase of infection. The lowest concentration of SARS-CoV-2 viral copies this assay can detect is 138 copies/mL. A negative result does not preclude SARS-Cov-2 infection and should not be used as the sole basis for treatment or other patient management decisions. A negative result may occur with  improper specimen collection/handling, submission of specimen other than nasopharyngeal swab, presence of viral mutation(s) within the areas targeted by this assay, and inadequate number of viral copies(<138 copies/mL). A negative result must be combined with clinical observations, patient history, and epidemiological information. The expected result is Negative.  Fact Sheet for Patients:   EntrepreneurPulse.com.au  Fact Sheet for Healthcare Providers:  IncredibleEmployment.be  This test is no t yet approved or cleared by the [place] FDA and  has been authorized for detection and/or diagnosis of SARS-CoV-2 by FDA under an Emergency Use Authorization (EUA). This EUA will remain  in effect (meaning this test can be used) for the duration of the COVID-19 declaration under Section 564(b)(1) of the Act, 21 U.S.C.section 360bbb-3(b)(1), unless the authorization is terminated  or revoked sooner.       Influenza A by PCR NEGATIVE NEGATIVE Final   Influenza B by PCR NEGATIVE NEGATIVE Final    Comment: (NOTE) The Xpert Xpress SARS-CoV-2/FLU/RSV plus assay is intended as an aid in the diagnosis of influenza from Nasopharyngeal swab specimens and should not be used as a sole basis for treatment. Nasal washings and aspirates are unacceptable for Xpert Xpress SARS-CoV-2/FLU/RSV testing.  Fact Sheet for Patients: EntrepreneurPulse.com.au  Fact Sheet for Healthcare Providers: IncredibleEmployment.be  This test is not yet approved or cleared by the [place] FDA and has been authorized for detection and/or diagnosis of SARS-CoV-2 by FDA under an Emergency Use Authorization (EUA). This EUA will remain in effect (meaning this test can be used) for the duration of the COVID-19 declaration under Section 564(b)(1) of the Act, 21 U.S.C. section 360bbb-3(b)(1), unless the authorization is terminated or revoked.  Performed at [facility], [place] [address], [place], [place] [address]   Urine Culture     Status: Abnormal   Collection Time: 11/26/22 11:15 PM   Specimen: In/Out Cath Urine  Result Value Ref Range Status   Specimen Description   Final    IN/OUT CATH URINE Performed at [facility] [address], [place], [place] [address]    Special Requests   Final     NONE Performed at [facility], [place] [address], [place], [place] [address]    Culture >=100,000 COLONIES/mL ESCHERICHIA COLI (A)  Final   Report Status 11/29/2022 FINAL  Final   Organism ID, Bacteria ESCHERICHIA COLI (A)  Final      Susceptibility   Escherichia coli - MIC*    AMPICILLIN <=2 SENSITIVE Sensitive     CEFAZOLIN <=4 SENSITIVE Sensitive     CEFEPIME <=0.12 SENSITIVE Sensitive     CEFTRIAXONE <=0.25 SENSITIVE Sensitive     CIPROFLOXACIN <=0.25 SENSITIVE Sensitive     GENTAMICIN <=1 SENSITIVE Sensitive     IMIPENEM <=0.25 SENSITIVE Sensitive     NITROFURANTOIN <=  16 SENSITIVE Sensitive     TRIMETH/SULFA <=20 SENSITIVE Sensitive     AMPICILLIN/SULBACTAM <=2 SENSITIVE Sensitive     PIP/TAZO <=4 SENSITIVE Sensitive     * >=100,000 COLONIES/mL ESCHERICHIA COLI  MRSA Next Gen by PCR, Nasal     Status: None   Collection Time: 11/27/22  6:49 PM   Specimen: Nasal Mucosa; Nasal Swab  Result Value Ref Range Status   MRSA by PCR Next Gen NOT DETECTED NOT DETECTED Final    Comment: (NOTE) The GeneXpert MRSA Assay (FDA approved for NASAL specimens only), is one component of a comprehensive MRSA colonization surveillance program. It is not intended to diagnose MRSA infection nor to guide or monitor treatment for MRSA infections. Test performance is not FDA approved in patients less than 52 years old. Performed at [facility], [place] [address] [place], [place] [address]   Gastrointestinal Panel by PCR , Stool     Status: None   Collection Time: 11/29/22 11:50 AM   Specimen: Stool  Result Value Ref Range Status   Campylobacter species NOT DETECTED NOT DETECTED Final   Plesimonas shigelloides NOT DETECTED NOT DETECTED Final   Salmonella species NOT DETECTED NOT DETECTED Final   Yersinia enterocolitica NOT DETECTED NOT DETECTED Final   Vibrio species NOT DETECTED NOT DETECTED Final   Vibrio cholerae NOT DETECTED NOT DETECTED Final    Enteroaggregative E coli (EAEC) NOT DETECTED NOT DETECTED Final   Enteropathogenic E coli (EPEC) NOT DETECTED NOT DETECTED Final   Enterotoxigenic E coli (ETEC) NOT DETECTED NOT DETECTED Final   Shiga like toxin producing E coli (STEC) NOT DETECTED NOT DETECTED Final   Shigella/Enteroinvasive E coli (EIEC) NOT DETECTED NOT DETECTED Final   Cryptosporidium NOT DETECTED NOT DETECTED Final   Cyclospora cayetanensis NOT DETECTED NOT DETECTED Final   Entamoeba histolytica NOT DETECTED NOT DETECTED Final   Giardia lamblia NOT DETECTED NOT DETECTED Final   Adenovirus F40/41 NOT DETECTED NOT DETECTED Final   Astrovirus NOT DETECTED NOT DETECTED Final   Norovirus GI/GII NOT DETECTED NOT DETECTED Final   Rotavirus A NOT DETECTED NOT DETECTED Final   Sapovirus (I, II, IV, and V) NOT DETECTED NOT DETECTED Final    Comment: Performed at [facility], [place] [place], [place] [address]  C Difficile Quick Screen (NO PCR Reflex)     Status: None   Collection Time: 11/29/22 11:50 AM   Specimen: Stool  Result Value Ref Range Status   C Diff antigen NEGATIVE NEGATIVE Final   C Diff toxin NEGATIVE NEGATIVE Final   C Diff interpretation No C. difficile detected.  Final    Comment: Performed at [facility], [place] [address], [place], [place] [address]   Labs: CBC: Recent Labs  Lab 11/26/22 [phone] 11/26/22 2205 11/28/22 0320 11/29/22 0745 11/30/22 0824  WBC  --  6.9 12.7* 12.9* 8.8  NEUTROABS  --   --   --  11.1*  --   HGB 10.6* 12.6 10.4* 11.1* 11.9*  HCT 34.9* 40.1 32.0* 33.4* 36.0  MCV  --  103.6* 100.0 97.1 97.3  PLT  --  317 271 322 371   Basic Metabolic Panel: Recent Labs  Lab 11/26/22 0653 11/26/22 2205 11/28/22 0320 11/29/22 0745 11/29/22 1653 11/30/22 0824  NA  --  140 139 138 139 138  K  --  3.2* 3.4* 2.9* 3.0* 4.1  CL  --  106 110 108 109 112*  CO2  --  26 22  20* 19* 16*  GLUCOSE  --  113* 95 106* 98 94  BUN  --  _0 CREATININE  --   0.82 0.53 0.47 0.48 0.47  CALCIUM  --  8.6* 8.4* 8.4* 8.6* 8.5*  MG 1.7  --  1.7 1.8  --  2.0   Liver Function Tests: Recent Labs  Lab 11/26/22 2205 11/28/22 0320 11/29/22 0745  AST 65* 44* 29  ALT 51* 40 37  ALKPHOS 86 75 73  BILITOT 0.5 0.4 0.6  PROT 6.7 5.2* 6.2*  ALBUMIN 4.0 2.8* 3.2*   CBG: Recent Labs  Lab 11/26/22 2252  GLUCAP 92    Discharge time spent: greater than 30 minutes.  Signed: Berle Mull, MD [facility] 11/30/2022

## 2022-12-01 ENCOUNTER — Other Ambulatory Visit (HOSPITAL_COMMUNITY): Payer: Self-pay

## 2022-12-02 LAB — CULTURE, BLOOD (ROUTINE X 2)
Culture: NO GROWTH
Culture: NO GROWTH
Special Requests: ADEQUATE

## 2022-12-11 ENCOUNTER — Other Ambulatory Visit (HOSPITAL_COMMUNITY): Payer: Self-pay

## 2022-12-13 ENCOUNTER — Other Ambulatory Visit (HOSPITAL_COMMUNITY): Payer: Self-pay

## 2022-12-13 MED ORDER — CLONIDINE HCL 0.1 MG PO TABS
0.1000 mg | ORAL_TABLET | Freq: Every day | ORAL | 2 refills | Status: DC
  Filled 2022-12-13: qty 90, 90d supply, fill #0
  Filled 2023-04-01: qty 90, 90d supply, fill #1
  Filled 2023-06-26 – 2023-09-06 (×7): qty 90, 90d supply, fill #2

## 2023-01-20 ENCOUNTER — Other Ambulatory Visit (HOSPITAL_COMMUNITY): Payer: Self-pay

## 2023-01-28 ENCOUNTER — Encounter: Payer: Self-pay | Admitting: Physical Medicine & Rehabilitation

## 2023-01-28 ENCOUNTER — Other Ambulatory Visit (HOSPITAL_COMMUNITY): Payer: Self-pay

## 2023-02-03 ENCOUNTER — Other Ambulatory Visit (HOSPITAL_COMMUNITY): Payer: Self-pay

## 2023-02-07 ENCOUNTER — Other Ambulatory Visit (HOSPITAL_COMMUNITY): Payer: Self-pay

## 2023-02-08 ENCOUNTER — Other Ambulatory Visit (HOSPITAL_COMMUNITY): Payer: Self-pay

## 2023-02-08 MED ORDER — GABAPENTIN 600 MG PO TABS: 600.0000 mg | ORAL_TABLET | Freq: Three times a day (TID) | ORAL | 5 refills | Status: DC | PRN

## 2023-02-08 MED ORDER — GABAPENTIN 600 MG PO TABS
600.0000 mg | ORAL_TABLET | Freq: Three times a day (TID) | ORAL | 5 refills | Status: DC | PRN
  Filled 2023-02-08 – 2023-02-09 (×2): qty 90, 30d supply, fill #0

## 2023-02-09 ENCOUNTER — Other Ambulatory Visit (HOSPITAL_COMMUNITY): Payer: Self-pay

## 2023-02-10 ENCOUNTER — Other Ambulatory Visit (HOSPITAL_COMMUNITY): Payer: Self-pay

## 2023-02-17 ENCOUNTER — Encounter: Payer: Self-pay | Admitting: Physical Medicine & Rehabilitation

## 2023-02-17 ENCOUNTER — Encounter: Payer: Medicaid Other | Attending: Physical Medicine & Rehabilitation | Admitting: Physical Medicine & Rehabilitation

## 2023-02-17 ENCOUNTER — Other Ambulatory Visit (HOSPITAL_COMMUNITY): Payer: Self-pay

## 2023-02-17 VITALS — BP 119/85 | HR 93 | Ht 65.0 in | Wt 143.0 lb

## 2023-02-17 DIAGNOSIS — M542 Cervicalgia: Secondary | ICD-10-CM | POA: Insufficient documentation

## 2023-02-17 DIAGNOSIS — G43809 Other migraine, not intractable, without status migrainosus: Secondary | ICD-10-CM | POA: Diagnosis not present

## 2023-02-17 DIAGNOSIS — G8929 Other chronic pain: Secondary | ICD-10-CM | POA: Insufficient documentation

## 2023-02-17 MED ORDER — PREGABALIN 75 MG PO CAPS
75.0000 mg | ORAL_CAPSULE | Freq: Two times a day (BID) | ORAL | 3 refills | Status: DC
  Filled 2023-02-17: qty 60, 30d supply, fill #0

## 2023-02-17 MED ORDER — BUTALBITAL-APAP-CAFFEINE 50-325-40 MG PO TABS
1.0000 | ORAL_TABLET | Freq: Four times a day (QID) | ORAL | 0 refills | Status: DC | PRN
  Filled 2023-02-17: qty 7, 2d supply, fill #0

## 2023-02-17 NOTE — Progress Notes (Addendum)
Subjective:    Patient ID: [name], female    DOB: 06-26-1966, 57 y.o.   MRN: [id number]  HPI   [name] is a 57 y.o. year old female  who  has a past medical history of Abnormal maternal glucose tolerance, complicating pregnancy, childbirth, or the puerperium, unspecified as to episode of care, Acute MI ([place]) (2004), Chronic back pain, Generalized anxiety disorder, GERD (gastroesophageal reflux disease), Hyperlipidemia, Migraine, Panic disorder without agoraphobia, and Tobacco use disorder.   They are presenting to PM&R clinic as a new patient for pain management evaluation. They were referred by [facility] for treatment of chronic pain.  Patient reports history of chronic migraines. She keeps a migraine journal.   She reports she was seen by [facility] wellness [facility] Dr. Derrill Memo and had multiple medications trialed for her migraines.  She has been followed by [facility] pain management in the past.   Most recently her medications were provided by her PCP at [facility] who is also managing her pain.  She has been taking hydrocodone for many years and reports that taking this medication helped her neck pain and also decreased her frequency of migraines.  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  Patient is recently admitted to the hospital in December with acute toxic encephalopathy after an accidental overdose.  She was found unresponsive at home.  Discharge summary indicates she was able to fill multiple prescriptions of hydrocodone and also a prescription of oxycodone.  She was also noted to have Xanax which was not recently prescribed.  Patient reports she was given some extra pills from a friend and this is what resulted in the event.  She is no longer being prescribed hydrocodone and reports her pain in her neck is significantly worsened.  She is also having more migraines.  She is currently taking gabapentin  which helps her neck pain however she is only able to tolerate taking this at night because it causes too much sedation if she takes it during the day.    Red flag symptoms: No red flags for back pain endorsed in Hx or ROS, saddle anesthesia, loss of bowel or bladder continence, new weakness, new numbness/tingling, and pain waking up at nighttime.  Medications tried: Nsaids Naproxone Tylenol  - does not help Opiates  Hydrocodone and Oxycodone helped Gabapentin -helps at night,  Pregabalin -has not tried SNRIs  -Duloxetine made her feel bad     Other treatments: PT/OT  - didn't help TENs unit - does not help Injections - for migraines, minimal benefit    Prior UDS results:     Component Value Date/Time   LABOPIA POSITIVE (A) 11/26/2022 2315   COCAINSCRNUR NONE DETECTED 11/26/2022 2315   LABBENZ POSITIVE (A) 11/26/2022 2315   AMPHETMU NONE DETECTED 11/26/2022 2315   THCU NONE DETECTED 11/26/2022 2315   LABBARB NONE DETECTED 11/26/2022 2315       Pain Inventory Average Pain 8 Pain Right Now 9 My pain is sharp, stabbing, and aching  In the last 24 hours, has pain interfered with the following? General activity 8 Relation with others 9 Enjoyment of life 8 What TIME of day is your pain at its worst? morning  and daytime Sleep (in general) Poor  Pain is worse with: bending, sitting, standing, and unsure Pain improves with: heat/ice, therapy/exercise, and medication Relief from Meds: 8  walk without assistance how many minutes  can you walk? 10 ability to climb steps?  yes do you drive?  yes  disabled: date disabled 04/2017  weakness spasms anxiety  Any changes since last visit?  no  New patient    Family History  Problem Relation Age of Onset   Coronary artery disease Father    Heart disease Father    Depression Father    Anxiety disorder Father    Hyperlipidemia Father    Hypertension Father    Stroke Father    Asthma Mother    Heart disease  Mother    Depression Mother    Anxiety disorder Mother    Depression Brother    Anxiety disorder Brother    Hypertension Brother    Social History   Socioeconomic History   Marital status: Divorced    Spouse name: Not on file   Number of children: Not on file   Years of education: Not on file   Highest education level: Not on file  Occupational History   Occupation: unemployed  Tobacco Use   Smoking status: Every Day    Packs/day: 1.00    Years: 35.00    Total pack years: 35.00    Types: Cigarettes   Smokeless tobacco: Never  Substance and Sexual Activity   Alcohol use: No    Alcohol/week: 0.0 standard drinks of alcohol   Drug use: No   Sexual activity: Not Currently    Birth control/protection: Surgical  Other Topics Concern   Not on file  Social History Narrative   Not on file   Social Determinants of Health   Financial Resource Strain: Not on file  Food Insecurity: No Food Insecurity (11/27/2022)   Hunger Vital Sign    Worried About Running Out of Food in the Last Year: Never true    Ran Out of Food in the Last Year: Never true  Transportation Needs: No Transportation Needs (11/27/2022)   PRAPARE - Hydrologist (Medical): No    Lack of Transportation (Non-Medical): No  Physical Activity: Not on file  Stress: Not on file  Social Connections: Not on file   Past Surgical History:  Procedure Laterality Date   ABDOMINAL HYSTERECTOMY  2009   ADENOIDECTOMY     BILATERAL SALPINGOOPHORECTOMY  12/11   by DrBernardo   CESAREAN SECTION  2004, 2006   CHOLECYSTECTOMY     RHINOPLASTY     TONSILLECTOMY     Past Medical History:  Diagnosis Date   Abnormal maternal glucose tolerance, complicating pregnancy, childbirth, or the puerperium, unspecified as to episode of care    Acute MI ([place]) 2004   Chronic back pain    Generalized anxiety disorder    GERD (gastroesophageal reflux disease)    Hyperlipidemia    Migraine    since age 28    Panic disorder without agoraphobia    Tobacco use disorder    BP 119/85   Pulse 93   Ht '5\' 5"'$  (1.651 m)   Wt 143 lb (64.9 kg)   SpO2 98%   BMI 23.80 kg/m   Opioid Risk Score:   Fall Risk Score:  `1  Depression screen [facility] 2/9     03/19/2016   12:59 PM 05/14/2015    4:22 PM  Depression screen PHQ 2/9  Decreased Interest 0 1  Down, Depressed, Hopeless 0 1  PHQ - 2 Score 0 2  Altered sleeping  1  Tired, decreased energy  2  Change in appetite  0  Feeling bad or failure about yourself   0  Trouble concentrating  1  Moving slowly or fidgety/restless  1  Suicidal thoughts  0  PHQ-9 Score  7     Review of Systems  Musculoskeletal:  Positive for back pain and neck pain.        rib,b/l shoulder right wrist       Objective:   Physical Exam  Gen: no distress, normal appearing HEENT: oral mucosa pink and moist, NCAT Cardio: Reg rate Chest: normal effort, normal rate of breathing Abd: soft, non-distended Ext: no edema Psych: pleasant, normal affect Skin: intact Neuro: Alert and oriented, follows commands, cranial nerves II through XII intact, normal speech and language Strength 5 out of 5 in bilateral upper extremities Strength no focal deficits noted in lower extremities however appears to be limited by decreased effort, strength least 5- out of 5 Sensation intact light touch in all 4 extremities Musculoskeletal:   Tenderness to palpation lumbar spine and cervical spine and trapezius, multiple tender points in these areas, no trigger points noted SLR negative bilaterally Decreased L spine motion in all directions  Spurling's resulted in neck pain  CT neck 2017 MPRESSION: CT head: There is no intracranial mass, hemorrhage, or extra-axial fluid collection. Gray-white compartments are normal. There is right maxillary sinus disease. There is rightward deviation of the nasal septum.   CT cervical spine: No fracture or spondylolisthesis. No evident arthropathy.     Assessment & Plan:  [name] is a 57 y.o. year old female  who  has a past medical history of Abnormal maternal glucose tolerance, complicating pregnancy, childbirth, or the puerperium, unspecified as to episode of care, Acute MI ([place]) (2004), Chronic back pain, Generalized anxiety disorder, GERD (gastroesophageal reflux disease), Hyperlipidemia, Migraine, Panic disorder without agoraphobia, and Tobacco use disorder.   They are presenting to PM&R clinic as a new patient for pain management evaluation for chronic neck pain.   Chronic neck pain -CT cervical spine previously without significant findings from 2017 -Discussed that I do not think it would be safe to continue opioid medications -Patient does report benefit from gabapentin however is not able to tolerate due to sedation, will try Lyrica 75 mg twice daily.  Stop gabapentin  Chronic migraine -Patient reports long history of migraine headache with multiple medications attempted, does not sound like she has seen a neurologist in several years and it does not sound like she is exhausted CGRP medications. -Will refer to neurology for headache clinic -Ordered small supply of Fioricet as she reports this medicine did help her in the distant past  Addendum 3/1 pt reports she didn't like how lyrica made her feel and caused sedation, she asked to restart gabapentin- DC lyrica, restart gabapentin '600mg'$  TID

## 2023-02-19 ENCOUNTER — Encounter: Payer: Self-pay | Admitting: Physical Medicine & Rehabilitation

## 2023-02-23 ENCOUNTER — Encounter: Payer: Self-pay | Admitting: Physical Medicine & Rehabilitation

## 2023-02-23 ENCOUNTER — Other Ambulatory Visit: Payer: Self-pay | Admitting: Physical Medicine & Rehabilitation

## 2023-02-24 ENCOUNTER — Other Ambulatory Visit (HOSPITAL_COMMUNITY): Payer: Self-pay

## 2023-02-24 ENCOUNTER — Other Ambulatory Visit: Payer: Self-pay

## 2023-02-24 ENCOUNTER — Telehealth: Payer: Self-pay | Admitting: *Deleted

## 2023-02-24 ENCOUNTER — Other Ambulatory Visit: Payer: Self-pay | Admitting: *Deleted

## 2023-02-24 NOTE — Progress Notes (Deleted)
Patient is requesting to resume Gabapentin. She was started on Pregabalin and says she sleeps to much and does not like how it makes her feel. Please advise.

## 2023-02-24 NOTE — Telephone Encounter (Signed)
Patient left vm and states Pregablin is making her sleep a lot and she doesn't like the way it  makes her feel.  Regina James, MD  Regina P Orrell56 minutes ago (3:15 PM)    We can definitely change back to gabapentin. What reaction are you having with lyrica?   This MyChart message has not been read.   [name]  P [facility] Clinical Pool (supporting Jennye Boroughs, MD)Yesterday (2:53 PM)    You prescribed me this medicine instead of the gabapentin I was on. I would like to switch back to the gabapentin. I am have adverse reactions to the Lyrica . Thank you

## 2023-02-24 NOTE — Progress Notes (Signed)
Will respond to mychart visit.

## 2023-02-25 ENCOUNTER — Other Ambulatory Visit (HOSPITAL_COMMUNITY): Payer: Self-pay

## 2023-02-25 ENCOUNTER — Other Ambulatory Visit: Payer: Self-pay | Admitting: Physical Medicine & Rehabilitation

## 2023-02-25 MED ORDER — BUTALBITAL-APAP-CAFFEINE 50-325-40 MG PO TABS
1.0000 | ORAL_TABLET | Freq: Four times a day (QID) | ORAL | 0 refills | Status: DC | PRN
  Filled 2023-02-25: qty 7, 2d supply, fill #0

## 2023-02-25 MED ORDER — GABAPENTIN 600 MG PO TABS
600.0000 mg | ORAL_TABLET | Freq: Three times a day (TID) | ORAL | 2 refills | Status: DC
  Filled 2023-02-25 – 2023-03-08 (×2): qty 90, 30d supply, fill #0
  Filled 2023-04-01: qty 90, 30d supply, fill #1
  Filled 2023-04-14 – 2023-04-26 (×3): qty 90, 30d supply, fill #2
  Filled ????-??-??: fill #1
  Filled ????-??-??: fill #2

## 2023-02-28 NOTE — Telephone Encounter (Signed)
Lvm that Lyrica d/c and restart Gabapentin.

## 2023-03-01 ENCOUNTER — Ambulatory Visit: Payer: Medicaid Other | Admitting: Psychiatry

## 2023-03-07 ENCOUNTER — Other Ambulatory Visit (HOSPITAL_COMMUNITY): Payer: Self-pay

## 2023-03-08 ENCOUNTER — Other Ambulatory Visit: Payer: Self-pay

## 2023-03-08 ENCOUNTER — Other Ambulatory Visit (HOSPITAL_COMMUNITY): Payer: Self-pay

## 2023-03-09 ENCOUNTER — Other Ambulatory Visit (HOSPITAL_COMMUNITY): Payer: Self-pay

## 2023-03-22 ENCOUNTER — Other Ambulatory Visit (HOSPITAL_COMMUNITY): Payer: Self-pay

## 2023-03-22 MED ORDER — GABAPENTIN 800 MG PO TABS
800.0000 mg | ORAL_TABLET | Freq: Four times a day (QID) | ORAL | 1 refills | Status: AC | PRN
  Filled 2023-03-22: qty 120, 30d supply, fill #0
  Filled 2023-05-11: qty 120, 30d supply, fill #1

## 2023-04-01 ENCOUNTER — Other Ambulatory Visit: Payer: Self-pay | Admitting: Physical Medicine & Rehabilitation

## 2023-04-01 ENCOUNTER — Other Ambulatory Visit: Payer: Self-pay

## 2023-04-01 MED ORDER — BUTALBITAL-APAP-CAFFEINE 50-325-40 MG PO TABS: 1.0000 | ORAL_TABLET | Freq: Four times a day (QID) | ORAL | 0 refills | Status: AC | PRN

## 2023-04-02 ENCOUNTER — Other Ambulatory Visit (HOSPITAL_COMMUNITY): Payer: Self-pay

## 2023-04-14 ENCOUNTER — Other Ambulatory Visit (HOSPITAL_COMMUNITY): Payer: Self-pay

## 2023-04-18 ENCOUNTER — Encounter: Payer: Medicaid Other | Admitting: Physical Medicine & Rehabilitation

## 2023-04-18 ENCOUNTER — Other Ambulatory Visit (HOSPITAL_COMMUNITY): Payer: Self-pay

## 2023-04-22 ENCOUNTER — Encounter: Payer: Medicaid Other | Admitting: Physical Medicine & Rehabilitation

## 2023-04-26 ENCOUNTER — Other Ambulatory Visit (HOSPITAL_COMMUNITY): Payer: Self-pay

## 2023-05-11 ENCOUNTER — Other Ambulatory Visit (HOSPITAL_COMMUNITY): Payer: Self-pay

## 2023-05-16 ENCOUNTER — Other Ambulatory Visit: Payer: Self-pay | Admitting: Physical Medicine & Rehabilitation

## 2023-05-16 MED ORDER — GABAPENTIN 600 MG PO TABS: 600.0000 mg | ORAL_TABLET | Freq: Three times a day (TID) | ORAL | 2 refills | Status: AC

## 2023-05-20 ENCOUNTER — Other Ambulatory Visit: Payer: Self-pay | Admitting: Physical Medicine & Rehabilitation

## 2023-05-20 ENCOUNTER — Other Ambulatory Visit (HOSPITAL_COMMUNITY): Payer: Self-pay

## 2023-05-21 ENCOUNTER — Other Ambulatory Visit (HOSPITAL_COMMUNITY): Payer: Self-pay

## 2023-05-27 ENCOUNTER — Other Ambulatory Visit (HOSPITAL_COMMUNITY): Payer: Self-pay

## 2023-05-30 ENCOUNTER — Other Ambulatory Visit (HOSPITAL_COMMUNITY): Payer: Self-pay

## 2023-05-30 ENCOUNTER — Other Ambulatory Visit: Payer: Self-pay | Admitting: Physical Medicine & Rehabilitation

## 2023-05-30 MED ORDER — GABAPENTIN 800 MG PO TABS: 800.0000 mg | ORAL_TABLET | Freq: Four times a day (QID) | ORAL | 2 refills | Status: AC | PRN

## 2023-05-30 MED ORDER — GABAPENTIN 800 MG PO TABS
800.0000 mg | ORAL_TABLET | Freq: Four times a day (QID) | ORAL | 2 refills | Status: DC
  Filled 2023-06-01 – 2023-08-04 (×12): qty 120, 30d supply, fill #0
  Filled 2023-08-22 – 2023-08-31 (×2): qty 120, 30d supply, fill #1
  Filled ????-??-?? (×3): fill #0

## 2023-05-31 ENCOUNTER — Other Ambulatory Visit (HOSPITAL_COMMUNITY): Payer: Self-pay

## 2023-06-01 ENCOUNTER — Other Ambulatory Visit: Payer: Self-pay

## 2023-06-01 ENCOUNTER — Other Ambulatory Visit (HOSPITAL_COMMUNITY): Payer: Self-pay

## 2023-06-02 ENCOUNTER — Other Ambulatory Visit (HOSPITAL_COMMUNITY): Payer: Self-pay

## 2023-06-06 ENCOUNTER — Other Ambulatory Visit (HOSPITAL_COMMUNITY): Payer: Self-pay

## 2023-06-08 ENCOUNTER — Other Ambulatory Visit (HOSPITAL_COMMUNITY): Payer: Self-pay

## 2023-06-15 ENCOUNTER — Other Ambulatory Visit (HOSPITAL_COMMUNITY): Payer: Self-pay

## 2023-06-16 ENCOUNTER — Other Ambulatory Visit (HOSPITAL_COMMUNITY): Payer: Self-pay

## 2023-06-17 ENCOUNTER — Other Ambulatory Visit (HOSPITAL_COMMUNITY): Payer: Self-pay

## 2023-06-20 ENCOUNTER — Ambulatory Visit: Payer: Medicaid Other | Attending: Internal Medicine | Admitting: Internal Medicine

## 2023-06-20 ENCOUNTER — Encounter: Payer: Self-pay | Admitting: Internal Medicine

## 2023-06-20 NOTE — Progress Notes (Unsigned)
Cardiology Office Note:    Date:  06/20/2023   ID:  Ashaki, Frosch [birth year], MRN [id number]  PCP:  [facility]   [facility] Providers Cardiologist:  None { Click to update primary MD,subspecialty MD or APP then REFRESH:1}    Referring MD: [facility]   No chief complaint on file. ***  History of Present Illness:    [name] is a 57 y.o. female with a hx of GERD, she had a prior normal stress in 2014. She asked for FU 2/2 hx of MI? Not documented.    Past Medical History:  Diagnosis Date   Abnormal maternal glucose tolerance, complicating pregnancy, childbirth, or the puerperium, unspecified as to episode of care    Acute MI ([facility]) 2004   Chronic back pain    Generalized anxiety disorder    GERD (gastroesophageal reflux disease)    Hyperlipidemia    Migraine    since age 58   Panic disorder without agoraphobia    Tobacco use disorder     Past Surgical History:  Procedure Laterality Date   ABDOMINAL HYSTERECTOMY  2009   ADENOIDECTOMY     BILATERAL SALPINGOOPHORECTOMY  12/11   by DrBernardo   CESAREAN SECTION  2004, 2006   CHOLECYSTECTOMY     RHINOPLASTY     TONSILLECTOMY      Current Medications: No outpatient medications have been marked as taking for the 06/20/23 encounter (Appointment) with Maisie Fus, MD.     Allergies:   Imitrex [sumatriptan], Relpax [eletriptan], Topamax [topiramate], Mucinex [guaifenesin er], Promethazine, and Triptans   Social History   Socioeconomic History   Marital status: Divorced    Spouse name: Not on file   Number of children: Not on file   Years of education: Not on file   Highest education level: Not on file  Occupational History   Occupation: unemployed  Tobacco Use   Smoking status: Every Day    Packs/day: 1.00    Years: 35.00    Additional pack years: 0.00    Total pack years: 35.00    Types: Cigarettes   Smokeless tobacco: Never  Substance and Sexual Activity    Alcohol use: No    Alcohol/week: 0.0 standard drinks of alcohol   Drug use: No   Sexual activity: Not Currently    Birth control/protection: Surgical  Other Topics Concern   Not on file  Social History Narrative   Not on file   Social Determinants of Health   Financial Resource Strain: Not on file  Food Insecurity: No Food Insecurity (11/27/2022)   Hunger Vital Sign    Worried About Running Out of Food in the Last Year: Never true    Ran Out of Food in the Last Year: Never true  Transportation Needs: No Transportation Needs (11/27/2022)   PRAPARE - Administrator, Civil Service (Medical): No    Lack of Transportation (Non-Medical): No  Physical Activity: Not on file  Stress: Not on file  Social Connections: Not on file     Family History: The patient's ***family history includes Anxiety disorder in her brother, father, and mother; Asthma in her mother; Coronary artery disease in her father; Depression in her brother, father, and mother; Heart disease in her father and mother; Hyperlipidemia in her father; Hypertension in her brother and father; Stroke in her father.  ROS:   Please see the history of present illness.     All other systems reviewed  and are negative.  EKGs/Labs/Other Studies Reviewed:    08/2023 Nuclear study in September of 2014 showed an ejection fraction of 63% and normal perfusion.    The following studies were reviewed today: ***      Recent Labs: 11/29/2022: ALT 37 11/30/2022: BUN 12; Creatinine, Ser 0.47; Hemoglobin 11.9; Magnesium 2.0; Platelets 366; Potassium 4.1; Sodium 138  Recent Lipid Panel    Component Value Date/Time   CHOL 273 (H) 05/05/2015 1006   TRIG 369 (H) 05/05/2015 1006   HDL 45 (L) 05/05/2015 1006   CHOLHDL 6.1 05/05/2015 1006   VLDL 74 (H) 05/05/2015 1006   LDLCALC 154 (H) 05/05/2015 1006   LDLDIRECT 153.6 04/20/2011 1049     Risk Assessment/Calculations:     Physical Exam:    VS:  There were no vitals taken  for this visit.    Wt Readings from Last 3 Encounters:  02/17/23 143 lb (64.9 kg)  11/27/22 150 lb 5.7 oz (68.2 kg)  11/21/17 144 lb (65.3 kg)     GEN: *** Well nourished, well developed in no acute distress HEENT: Normal NECK: No JVD; No carotid bruits LYMPHATICS: No lymphadenopathy CARDIAC: ***RRR, no murmurs, rubs, gallops RESPIRATORY:  Clear to auscultation without rales, wheezing or rhonchi  ABDOMEN: Soft, non-tender, non-distended MUSCULOSKELETAL:  No edema; No deformity  SKIN: Warm and dry NEUROLOGIC:  Alert and oriented x 3 PSYCHIATRIC:  Normal affect   ASSESSMENT:    No diagnosis found. PLAN:    In order of problems listed above:  ***      {Are you ordering a CV Procedure (e.g. stress test, cath, DCCV, TEE, etc)?   Press F2        :[id number]}    Medication Adjustments/Labs and Tests Ordered: Current medicines are reviewed at length with the patient today.  Concerns regarding medicines are outlined above.  No orders of the defined types were placed in this encounter.  No orders of the defined types were placed in this encounter.   There are no Patient Instructions on file for this visit.   Signed, Maisie Fus, MD  06/20/2023 9:45 AM    [facility]

## 2023-06-22 NOTE — Progress Notes (Signed)
error 

## 2023-06-27 ENCOUNTER — Other Ambulatory Visit (HOSPITAL_COMMUNITY): Payer: Self-pay

## 2023-06-27 ENCOUNTER — Other Ambulatory Visit: Payer: Self-pay

## 2023-06-28 ENCOUNTER — Other Ambulatory Visit (HOSPITAL_COMMUNITY): Payer: Self-pay

## 2023-07-19 ENCOUNTER — Other Ambulatory Visit (HOSPITAL_COMMUNITY): Payer: Self-pay

## 2023-07-20 ENCOUNTER — Other Ambulatory Visit (HOSPITAL_COMMUNITY): Payer: Self-pay

## 2023-08-03 ENCOUNTER — Other Ambulatory Visit (HOSPITAL_COMMUNITY): Payer: Self-pay

## 2023-08-04 ENCOUNTER — Other Ambulatory Visit: Payer: Self-pay

## 2023-08-04 ENCOUNTER — Other Ambulatory Visit (HOSPITAL_COMMUNITY): Payer: Self-pay

## 2023-08-06 ENCOUNTER — Other Ambulatory Visit (HOSPITAL_COMMUNITY): Payer: Self-pay

## 2023-08-15 ENCOUNTER — Other Ambulatory Visit: Payer: Self-pay

## 2023-08-15 ENCOUNTER — Other Ambulatory Visit (HOSPITAL_COMMUNITY): Payer: Self-pay

## 2023-08-22 ENCOUNTER — Other Ambulatory Visit (HOSPITAL_COMMUNITY): Payer: Self-pay

## 2023-08-24 ENCOUNTER — Other Ambulatory Visit (HOSPITAL_COMMUNITY): Payer: Self-pay

## 2023-08-30 ENCOUNTER — Ambulatory Visit: Payer: Medicaid Other | Admitting: Internal Medicine

## 2023-08-31 ENCOUNTER — Other Ambulatory Visit (HOSPITAL_COMMUNITY): Payer: Self-pay

## 2023-09-07 ENCOUNTER — Other Ambulatory Visit (HOSPITAL_COMMUNITY): Payer: Self-pay

## 2023-09-09 ENCOUNTER — Other Ambulatory Visit: Payer: Self-pay

## 2023-09-09 ENCOUNTER — Other Ambulatory Visit (HOSPITAL_COMMUNITY): Payer: Self-pay

## 2023-09-12 ENCOUNTER — Other Ambulatory Visit (HOSPITAL_COMMUNITY): Payer: Self-pay

## 2023-09-13 ENCOUNTER — Other Ambulatory Visit (HOSPITAL_COMMUNITY): Payer: Self-pay

## 2023-09-13 ENCOUNTER — Other Ambulatory Visit: Payer: Self-pay

## 2023-09-13 MED ORDER — GABAPENTIN 800 MG PO TABS
800.0000 mg | ORAL_TABLET | Freq: Four times a day (QID) | ORAL | 2 refills | Status: AC
  Filled 2023-09-16 – 2023-10-05 (×3): qty 120, 30d supply, fill #0
  Filled 2023-10-31 – 2023-11-04 (×2): qty 120, 30d supply, fill #1

## 2023-09-13 MED ORDER — CLONIDINE HCL 0.1 MG PO TABS
0.1000 mg | ORAL_TABLET | Freq: Two times a day (BID) | ORAL | 2 refills | Status: AC
  Filled 2023-09-13 – 2023-10-05 (×3): qty 180, 90d supply, fill #0
  Filled 2023-11-25 – 2024-01-24 (×2): qty 180, 90d supply, fill #1
  Filled 2024-03-19 – 2024-06-07 (×4): qty 180, 90d supply, fill #2

## 2023-09-16 ENCOUNTER — Other Ambulatory Visit (HOSPITAL_COMMUNITY): Payer: Self-pay

## 2023-09-23 ENCOUNTER — Other Ambulatory Visit (HOSPITAL_COMMUNITY): Payer: Self-pay

## 2023-09-24 ENCOUNTER — Other Ambulatory Visit (HOSPITAL_COMMUNITY): Payer: Self-pay

## 2023-09-26 ENCOUNTER — Other Ambulatory Visit: Payer: Self-pay

## 2023-10-05 ENCOUNTER — Other Ambulatory Visit (HOSPITAL_COMMUNITY): Payer: Self-pay

## 2023-10-26 ENCOUNTER — Other Ambulatory Visit (HOSPITAL_COMMUNITY): Payer: Self-pay

## 2023-10-26 MED ORDER — HYDROCODONE-ACETAMINOPHEN 10-325 MG PO TABS
1.0000 | ORAL_TABLET | Freq: Every day | ORAL | 0 refills | Status: DC
  Filled 2023-10-26 – 2023-11-04 (×2): qty 150, 30d supply, fill #0

## 2023-10-26 NOTE — Progress Notes (Deleted)
  Cardiology Office Note:  .   Date:  10/26/2023  ID:  [name], DOB [birth year], MRN [id number] PCP: [facility]  [facility] Providers Cardiologist:  None { Click to update primary MD,subspecialty MD or APP then REFRESH:1}   History of Present Illness: .   [name] is a 57 y.o. female history of smoking, referral because patient states that she had a heart attack.  She was in the hospital in December 2023 after an unresponsive episode.  This was deemed to be in the setting of drug overdose.  Psychiatry was involved but decided to not IVC her.  There was no documentation of a myocardial infarction.  She saw Dr. Jens Som in 2014 and had a nuclear study at that time which showed normal perfusion.  The indication was atypical chest pain.  I have no evidence of a documented MI. Today,  ROS:  per HPI otherwise negative   Studies Reviewed: .        *** Risk Assessment/Calculations:   {Does this patient have ATRIAL FIBRILLATION?[phone]} No BP recorded.  {Refresh Note OR Click here to enter BP  :1}***       Physical Exam:   VS:  There were no vitals taken for this visit.   Wt Readings from Last 3 Encounters:  02/17/23 143 lb (64.9 kg)  11/27/22 150 lb 5.7 oz (68.2 kg)  11/21/17 144 lb (65.3 kg)    GEN: Well nourished, well developed in no acute distress NECK: No JVD; No carotid bruits CARDIAC: ***RRR, no murmurs, rubs, gallops RESPIRATORY:  Clear to auscultation without rales, wheezing or rhonchi  ABDOMEN: Soft, non-tender, non-distended EXTREMITIES:  No edema; No deformity   ASSESSMENT AND PLAN: .   ***    {Are you ordering a CV Procedure (e.g. stress test, cath, DCCV, TEE, etc)?   Press F2        :[id number]}  Dispo: ***  Signed, Maisie Fus, MD

## 2023-10-28 ENCOUNTER — Ambulatory Visit: Payer: Medicaid Other | Admitting: Internal Medicine

## 2023-11-01 ENCOUNTER — Other Ambulatory Visit: Payer: Self-pay

## 2023-11-04 ENCOUNTER — Other Ambulatory Visit (HOSPITAL_COMMUNITY): Payer: Self-pay

## 2023-11-25 ENCOUNTER — Other Ambulatory Visit (HOSPITAL_COMMUNITY): Payer: Self-pay

## 2023-11-25 MED ORDER — GABAPENTIN 800 MG PO TABS
800.0000 mg | ORAL_TABLET | Freq: Four times a day (QID) | ORAL | 2 refills | Status: AC | PRN
  Filled 2023-11-25 – 2023-12-02 (×8): qty 120, 30d supply, fill #0

## 2023-11-25 MED ORDER — HYDROCODONE-ACETAMINOPHEN 10-325 MG PO TABS
1.0000 | ORAL_TABLET | Freq: Every day | ORAL | 0 refills | Status: DC
  Filled 2023-11-25 – 2023-12-02 (×8): qty 150, 30d supply, fill #0

## 2023-11-26 ENCOUNTER — Other Ambulatory Visit (HOSPITAL_COMMUNITY): Payer: Self-pay

## 2023-11-28 ENCOUNTER — Other Ambulatory Visit: Payer: Self-pay

## 2023-11-29 ENCOUNTER — Other Ambulatory Visit (HOSPITAL_COMMUNITY): Payer: Self-pay

## 2023-12-02 ENCOUNTER — Other Ambulatory Visit: Payer: Self-pay

## 2023-12-02 ENCOUNTER — Other Ambulatory Visit (HOSPITAL_COMMUNITY): Payer: Self-pay

## 2023-12-23 ENCOUNTER — Other Ambulatory Visit (HOSPITAL_COMMUNITY): Payer: Self-pay

## 2023-12-23 MED ORDER — GABAPENTIN 800 MG PO TABS
800.0000 mg | ORAL_TABLET | Freq: Four times a day (QID) | ORAL | 2 refills | Status: AC
  Filled 2023-12-23 – 2023-12-30 (×3): qty 120, 30d supply, fill #0
  Filled 2024-01-24: qty 120, 30d supply, fill #1
  Filled 2024-02-23 – 2024-03-21 (×3): qty 120, 30d supply, fill #2

## 2023-12-23 MED ORDER — HYDROCODONE-ACETAMINOPHEN 10-325 MG PO TABS
1.0000 | ORAL_TABLET | Freq: Every day | ORAL | 0 refills | Status: DC
  Filled 2023-12-23 – 2023-12-30 (×3): qty 150, 30d supply, fill #0

## 2023-12-29 ENCOUNTER — Other Ambulatory Visit (HOSPITAL_COMMUNITY): Payer: Self-pay

## 2023-12-30 ENCOUNTER — Other Ambulatory Visit (HOSPITAL_COMMUNITY): Payer: Self-pay

## 2023-12-30 ENCOUNTER — Other Ambulatory Visit: Payer: Self-pay

## 2024-01-23 ENCOUNTER — Other Ambulatory Visit (HOSPITAL_COMMUNITY): Payer: Self-pay

## 2024-01-23 MED ORDER — GABAPENTIN 800 MG PO TABS
800.0000 mg | ORAL_TABLET | Freq: Four times a day (QID) | ORAL | 2 refills | Status: DC | PRN
  Filled 2024-01-23 – 2024-02-18 (×6): qty 120, 30d supply, fill #0
  Filled 2024-04-11: qty 120, 30d supply, fill #1
  Filled 2024-04-19: qty 120, 30d supply, fill #2
  Filled ????-??-??: fill #2

## 2024-01-23 MED ORDER — HYDROCODONE-ACETAMINOPHEN 10-325 MG PO TABS
1.0000 | ORAL_TABLET | Freq: Every day | ORAL | 0 refills | Status: AC
  Filled 2024-01-23 – 2024-01-28 (×3): qty 150, 30d supply, fill #0

## 2024-01-25 ENCOUNTER — Other Ambulatory Visit (HOSPITAL_COMMUNITY): Payer: Self-pay

## 2024-01-28 ENCOUNTER — Other Ambulatory Visit (HOSPITAL_COMMUNITY): Payer: Self-pay

## 2024-02-07 ENCOUNTER — Other Ambulatory Visit: Payer: Self-pay

## 2024-02-08 ENCOUNTER — Other Ambulatory Visit: Payer: Self-pay

## 2024-02-18 ENCOUNTER — Other Ambulatory Visit (HOSPITAL_COMMUNITY): Payer: Self-pay

## 2024-02-20 ENCOUNTER — Other Ambulatory Visit (HOSPITAL_COMMUNITY): Payer: Self-pay

## 2024-02-20 MED ORDER — HYDROCODONE-ACETAMINOPHEN 10-325 MG PO TABS
1.0000 | ORAL_TABLET | Freq: Every day | ORAL | 0 refills | Status: AC | PRN
  Filled 2024-02-20: qty 150, 30d supply, fill #0
  Filled 2024-02-20: qty 150, 32d supply, fill #0
  Filled 2024-02-24 – 2024-02-25 (×2): qty 150, 30d supply, fill #0

## 2024-02-23 ENCOUNTER — Other Ambulatory Visit: Payer: Self-pay

## 2024-02-24 ENCOUNTER — Other Ambulatory Visit (HOSPITAL_COMMUNITY): Payer: Self-pay

## 2024-02-25 ENCOUNTER — Other Ambulatory Visit (HOSPITAL_COMMUNITY): Payer: Self-pay

## 2024-03-19 ENCOUNTER — Other Ambulatory Visit (HOSPITAL_COMMUNITY): Payer: Self-pay

## 2024-03-19 ENCOUNTER — Other Ambulatory Visit: Payer: Self-pay

## 2024-03-19 MED ORDER — HYDROCODONE-ACETAMINOPHEN 10-325 MG PO TABS
1.0000 | ORAL_TABLET | Freq: Every day | ORAL | 0 refills | Status: DC
  Filled 2024-03-19 – 2024-03-24 (×4): qty 150, 30d supply, fill #0

## 2024-03-19 MED ORDER — GABAPENTIN 800 MG PO TABS
800.0000 mg | ORAL_TABLET | Freq: Four times a day (QID) | ORAL | 2 refills | Status: AC | PRN
  Filled 2024-03-19 – 2024-05-10 (×4): qty 120, 30d supply, fill #0
  Filled 2024-05-24 – 2024-08-08 (×3): qty 120, 30d supply, fill #1

## 2024-03-19 MED ORDER — ATORVASTATIN CALCIUM 40 MG PO TABS
40.0000 mg | ORAL_TABLET | Freq: Every day | ORAL | 1 refills | Status: AC
  Filled 2024-03-19: qty 90, 90d supply, fill #0
  Filled 2024-07-30: qty 90, 90d supply, fill #1

## 2024-03-24 ENCOUNTER — Other Ambulatory Visit (HOSPITAL_COMMUNITY): Payer: Self-pay

## 2024-04-11 ENCOUNTER — Other Ambulatory Visit (HOSPITAL_COMMUNITY): Payer: Self-pay

## 2024-04-12 ENCOUNTER — Other Ambulatory Visit (HOSPITAL_COMMUNITY): Payer: Self-pay

## 2024-04-16 ENCOUNTER — Other Ambulatory Visit (HOSPITAL_COMMUNITY): Payer: Self-pay

## 2024-04-18 ENCOUNTER — Other Ambulatory Visit (HOSPITAL_COMMUNITY): Payer: Self-pay

## 2024-04-18 MED ORDER — GABAPENTIN 800 MG PO TABS
800.0000 mg | ORAL_TABLET | Freq: Four times a day (QID) | ORAL | 2 refills | Status: AC | PRN
  Filled 2024-04-20 – 2024-11-03 (×6): qty 120, 30d supply, fill #0

## 2024-04-18 MED ORDER — NALOXONE HCL 4 MG/0.1ML NA LIQD
4.0000 mg | NASAL | 0 refills | Status: DC
  Filled 2024-04-18 – 2024-04-21 (×2): qty 2, 1d supply, fill #0

## 2024-04-18 MED ORDER — ERGOCALCIFEROL 1.25 MG (50000 UT) PO CAPS
50000.0000 [IU] | ORAL_CAPSULE | ORAL | 2 refills | Status: AC
  Filled 2024-04-18: qty 4, 28d supply, fill #0
  Filled 2024-05-03: qty 13, 90d supply, fill #0
  Filled 2024-05-18: qty 12, 84d supply, fill #0
  Filled 2024-06-26 – 2024-08-06 (×2): qty 12, 84d supply, fill #1
  Filled 2024-11-03: qty 12, 84d supply, fill #2

## 2024-04-18 MED ORDER — HYDROCODONE-ACETAMINOPHEN 10-325 MG PO TABS
1.0000 | ORAL_TABLET | Freq: Every day | ORAL | 0 refills | Status: AC
  Filled 2024-04-18 – 2024-04-21 (×2): qty 150, 30d supply, fill #0

## 2024-04-18 MED ORDER — ATORVASTATIN CALCIUM 40 MG PO TABS
40.0000 mg | ORAL_TABLET | Freq: Every day | ORAL | 1 refills | Status: AC
  Filled 2024-05-03 – 2024-11-03 (×2): qty 90, 90d supply, fill #0

## 2024-04-19 ENCOUNTER — Other Ambulatory Visit (HOSPITAL_COMMUNITY): Payer: Self-pay

## 2024-04-20 ENCOUNTER — Other Ambulatory Visit: Payer: Self-pay

## 2024-04-20 ENCOUNTER — Other Ambulatory Visit (HOSPITAL_COMMUNITY): Payer: Self-pay

## 2024-04-21 ENCOUNTER — Other Ambulatory Visit (HOSPITAL_COMMUNITY): Payer: Self-pay

## 2024-04-28 ENCOUNTER — Other Ambulatory Visit (HOSPITAL_COMMUNITY): Payer: Self-pay

## 2024-05-02 ENCOUNTER — Other Ambulatory Visit (HOSPITAL_COMMUNITY): Payer: Self-pay

## 2024-05-03 ENCOUNTER — Other Ambulatory Visit: Payer: Self-pay

## 2024-05-03 ENCOUNTER — Other Ambulatory Visit (HOSPITAL_COMMUNITY): Payer: Self-pay

## 2024-05-07 ENCOUNTER — Other Ambulatory Visit (HOSPITAL_COMMUNITY): Payer: Self-pay

## 2024-05-10 ENCOUNTER — Other Ambulatory Visit (HOSPITAL_COMMUNITY): Payer: Self-pay

## 2024-05-18 ENCOUNTER — Other Ambulatory Visit (HOSPITAL_COMMUNITY): Payer: Self-pay

## 2024-05-18 MED ORDER — HYDROCODONE-ACETAMINOPHEN 10-325 MG PO TABS
1.0000 | ORAL_TABLET | Freq: Every day | ORAL | 0 refills | Status: DC | PRN
  Filled 2024-05-18 – 2024-05-19 (×2): qty 150, 30d supply, fill #0
  Filled ????-??-??: fill #0

## 2024-05-18 MED ORDER — GABAPENTIN 800 MG PO TABS
800.0000 mg | ORAL_TABLET | Freq: Four times a day (QID) | ORAL | 2 refills | Status: AC | PRN
Start: 2024-05-18 — End: ?
  Filled 2024-05-18 – 2024-06-07 (×2): qty 120, 30d supply, fill #0
  Filled 2024-06-26 – 2024-07-05 (×2): qty 120, 30d supply, fill #1
  Filled 2024-08-06 – 2024-08-28 (×6): qty 120, 30d supply, fill #2

## 2024-05-18 MED ORDER — NALOXONE HCL 4 MG/0.1ML NA LIQD
1.0000 | Freq: Every day | NASAL | 0 refills | Status: AC
Start: 2024-05-18 — End: ?
  Filled 2024-05-18 – 2024-08-06 (×2): qty 2, 2d supply, fill #0

## 2024-05-19 ENCOUNTER — Other Ambulatory Visit (HOSPITAL_COMMUNITY): Payer: Self-pay

## 2024-05-24 ENCOUNTER — Other Ambulatory Visit (HOSPITAL_COMMUNITY): Payer: Self-pay

## 2024-06-07 ENCOUNTER — Other Ambulatory Visit (HOSPITAL_COMMUNITY): Payer: Self-pay

## 2024-06-07 ENCOUNTER — Other Ambulatory Visit: Payer: Self-pay

## 2024-06-18 ENCOUNTER — Other Ambulatory Visit (HOSPITAL_COMMUNITY): Payer: Self-pay

## 2024-06-18 MED ORDER — HYDROCODONE-ACETAMINOPHEN 10-325 MG PO TABS
1.0000 | ORAL_TABLET | Freq: Every day | ORAL | 0 refills | Status: AC | PRN
  Filled 2024-06-18: qty 150, 30d supply, fill #0

## 2024-06-26 ENCOUNTER — Other Ambulatory Visit (HOSPITAL_COMMUNITY): Payer: Self-pay

## 2024-07-18 ENCOUNTER — Other Ambulatory Visit (HOSPITAL_COMMUNITY): Payer: Self-pay

## 2024-07-18 MED ORDER — GABAPENTIN 800 MG PO TABS
800.0000 mg | ORAL_TABLET | Freq: Four times a day (QID) | ORAL | 2 refills | Status: AC | PRN
  Filled 2024-07-30: qty 120, 30d supply, fill #0
  Filled 2024-08-06 – 2024-10-11 (×12): qty 120, 30d supply, fill #1

## 2024-07-18 MED ORDER — HYDROCODONE-ACETAMINOPHEN 10-325 MG PO TABS
1.0000 | ORAL_TABLET | Freq: Every day | ORAL | 0 refills | Status: DC
  Filled 2024-07-18: qty 150, 30d supply, fill #0

## 2024-07-18 MED ORDER — NALOXONE HCL 4 MG/0.1ML NA LIQD
1.0000 | NASAL | 0 refills | Status: AC
  Filled 2024-07-18: qty 2, 2d supply, fill #0

## 2024-07-30 ENCOUNTER — Other Ambulatory Visit (HOSPITAL_COMMUNITY): Payer: Self-pay

## 2024-07-30 ENCOUNTER — Other Ambulatory Visit (HOSPITAL_BASED_OUTPATIENT_CLINIC_OR_DEPARTMENT_OTHER): Payer: Self-pay

## 2024-07-31 ENCOUNTER — Other Ambulatory Visit: Payer: Self-pay

## 2024-07-31 ENCOUNTER — Other Ambulatory Visit (HOSPITAL_COMMUNITY): Payer: Self-pay

## 2024-08-06 ENCOUNTER — Other Ambulatory Visit: Payer: Self-pay

## 2024-08-06 ENCOUNTER — Other Ambulatory Visit (HOSPITAL_COMMUNITY): Payer: Self-pay

## 2024-08-07 ENCOUNTER — Other Ambulatory Visit (HOSPITAL_COMMUNITY): Payer: Self-pay

## 2024-08-08 ENCOUNTER — Other Ambulatory Visit (HOSPITAL_BASED_OUTPATIENT_CLINIC_OR_DEPARTMENT_OTHER): Payer: Self-pay

## 2024-08-08 ENCOUNTER — Other Ambulatory Visit (HOSPITAL_COMMUNITY): Payer: Self-pay

## 2024-08-08 ENCOUNTER — Other Ambulatory Visit: Payer: Self-pay

## 2024-08-09 ENCOUNTER — Other Ambulatory Visit (HOSPITAL_COMMUNITY): Payer: Self-pay

## 2024-08-10 ENCOUNTER — Other Ambulatory Visit (HOSPITAL_COMMUNITY): Payer: Self-pay

## 2024-08-16 ENCOUNTER — Other Ambulatory Visit (HOSPITAL_COMMUNITY): Payer: Self-pay

## 2024-08-16 MED ORDER — NALOXONE HCL 4 MG/0.1ML NA LIQD
NASAL | 0 refills | Status: AC
  Filled 2024-08-16 – 2024-09-06 (×2): qty 2, 30d supply, fill #0

## 2024-08-16 MED ORDER — GABAPENTIN 800 MG PO TABS
800.0000 mg | ORAL_TABLET | Freq: Four times a day (QID) | ORAL | 2 refills | Status: AC | PRN
  Filled 2024-08-16 – 2024-09-25 (×7): qty 120, 30d supply, fill #0
  Filled 2024-10-15 – 2024-11-05 (×2): qty 120, 30d supply, fill #1

## 2024-08-16 MED ORDER — HYDROCODONE-ACETAMINOPHEN 10-325 MG PO TABS
1.0000 | ORAL_TABLET | Freq: Every day | ORAL | 0 refills | Status: DC
  Filled 2024-08-16: qty 150, 30d supply, fill #0

## 2024-08-28 ENCOUNTER — Other Ambulatory Visit (HOSPITAL_COMMUNITY): Payer: Self-pay

## 2024-08-29 ENCOUNTER — Other Ambulatory Visit (HOSPITAL_COMMUNITY): Payer: Self-pay

## 2024-09-06 ENCOUNTER — Other Ambulatory Visit: Payer: Self-pay

## 2024-09-06 ENCOUNTER — Other Ambulatory Visit (HOSPITAL_COMMUNITY): Payer: Self-pay

## 2024-09-07 ENCOUNTER — Other Ambulatory Visit: Payer: Self-pay

## 2024-09-07 ENCOUNTER — Other Ambulatory Visit (HOSPITAL_COMMUNITY): Payer: Self-pay

## 2024-09-08 ENCOUNTER — Other Ambulatory Visit (HOSPITAL_COMMUNITY): Payer: Self-pay

## 2024-09-11 ENCOUNTER — Other Ambulatory Visit (HOSPITAL_COMMUNITY): Payer: Self-pay

## 2024-09-12 ENCOUNTER — Other Ambulatory Visit (HOSPITAL_COMMUNITY): Payer: Self-pay

## 2024-09-13 ENCOUNTER — Other Ambulatory Visit (HOSPITAL_COMMUNITY): Payer: Self-pay

## 2024-09-14 ENCOUNTER — Encounter (HOSPITAL_COMMUNITY): Payer: Self-pay

## 2024-09-14 ENCOUNTER — Other Ambulatory Visit (HOSPITAL_COMMUNITY): Payer: Self-pay

## 2024-09-14 MED ORDER — NALOXONE HCL 4 MG/0.1ML NA LIQD
1.0000 | NASAL | 0 refills | Status: DC
  Filled 2024-09-14: qty 2, 1d supply, fill #0
  Filled 2024-10-09: qty 2, 2d supply, fill #0

## 2024-09-14 MED ORDER — GABAPENTIN 800 MG PO TABS
800.0000 mg | ORAL_TABLET | Freq: Four times a day (QID) | ORAL | 2 refills | Status: AC | PRN
  Filled 2024-09-14 – 2024-10-22 (×6): qty 120, 30d supply, fill #0

## 2024-09-14 MED ORDER — HYDROCODONE-ACETAMINOPHEN 10-325 MG PO TABS
1.0000 | ORAL_TABLET | Freq: Every day | ORAL | 0 refills | Status: DC
  Filled 2024-09-14: qty 150, 30d supply, fill #0

## 2024-09-21 ENCOUNTER — Other Ambulatory Visit: Payer: Self-pay

## 2024-09-25 ENCOUNTER — Other Ambulatory Visit: Payer: Self-pay

## 2024-09-25 ENCOUNTER — Other Ambulatory Visit (HOSPITAL_COMMUNITY): Payer: Self-pay

## 2024-10-09 ENCOUNTER — Other Ambulatory Visit (HOSPITAL_COMMUNITY): Payer: Self-pay

## 2024-10-09 ENCOUNTER — Other Ambulatory Visit: Payer: Self-pay

## 2024-10-15 ENCOUNTER — Other Ambulatory Visit (HOSPITAL_COMMUNITY): Payer: Self-pay

## 2024-10-16 ENCOUNTER — Other Ambulatory Visit (HOSPITAL_COMMUNITY): Payer: Self-pay

## 2024-10-16 ENCOUNTER — Other Ambulatory Visit (HOSPITAL_BASED_OUTPATIENT_CLINIC_OR_DEPARTMENT_OTHER): Payer: Self-pay

## 2024-10-16 MED ORDER — GABAPENTIN 800 MG PO TABS
800.0000 mg | ORAL_TABLET | Freq: Four times a day (QID) | ORAL | 2 refills | Status: AC | PRN
  Filled 2024-11-19: qty 120, 30d supply, fill #0
  Filled 2024-12-01 – 2024-12-07 (×5): qty 120, 30d supply, fill #1

## 2024-10-16 MED ORDER — HYDROCODONE-ACETAMINOPHEN 10-325 MG PO TABS
1.0000 | ORAL_TABLET | Freq: Every day | ORAL | 0 refills | Status: DC
  Filled 2024-10-16: qty 150, 30d supply, fill #0

## 2024-10-22 ENCOUNTER — Other Ambulatory Visit: Payer: Self-pay

## 2024-10-22 ENCOUNTER — Other Ambulatory Visit (HOSPITAL_COMMUNITY): Payer: Self-pay

## 2024-10-25 ENCOUNTER — Other Ambulatory Visit (HOSPITAL_COMMUNITY): Payer: Self-pay

## 2024-11-04 ENCOUNTER — Other Ambulatory Visit (HOSPITAL_COMMUNITY): Payer: Self-pay

## 2024-11-05 ENCOUNTER — Other Ambulatory Visit: Payer: Self-pay

## 2024-11-06 ENCOUNTER — Other Ambulatory Visit (HOSPITAL_COMMUNITY): Payer: Self-pay

## 2024-11-16 ENCOUNTER — Other Ambulatory Visit (HOSPITAL_COMMUNITY): Payer: Self-pay

## 2024-11-16 MED ORDER — GABAPENTIN 800 MG PO TABS
800.0000 mg | ORAL_TABLET | Freq: Four times a day (QID) | ORAL | 2 refills | Status: AC | PRN
  Filled 2024-12-05 – 2024-12-08 (×3): qty 120, 30d supply, fill #0
  Filled ????-??-??: fill #0

## 2024-11-16 MED ORDER — NALOXONE HCL 4 MG/0.1ML NA LIQD
NASAL | 0 refills | Status: DC
  Filled 2024-11-16: qty 2, 30d supply, fill #0

## 2024-11-16 MED ORDER — HYDROCODONE-ACETAMINOPHEN 10-325 MG PO TABS
1.0000 | ORAL_TABLET | Freq: Every day | ORAL | 0 refills | Status: DC
  Filled 2024-11-16: qty 150, 30d supply, fill #0

## 2024-11-16 MED ORDER — ATORVASTATIN CALCIUM 80 MG PO TABS
80.0000 mg | ORAL_TABLET | Freq: Every day | ORAL | 1 refills | Status: AC
  Filled 2024-11-16: qty 90, 90d supply, fill #0
  Filled 2025-01-16: qty 90, 90d supply, fill #1

## 2024-11-19 ENCOUNTER — Other Ambulatory Visit: Payer: Self-pay

## 2024-11-19 ENCOUNTER — Other Ambulatory Visit (HOSPITAL_COMMUNITY): Payer: Self-pay

## 2024-12-01 ENCOUNTER — Other Ambulatory Visit (HOSPITAL_COMMUNITY): Payer: Self-pay

## 2024-12-04 ENCOUNTER — Other Ambulatory Visit (HOSPITAL_COMMUNITY): Payer: Self-pay

## 2024-12-05 ENCOUNTER — Other Ambulatory Visit (HOSPITAL_COMMUNITY): Payer: Self-pay

## 2024-12-06 ENCOUNTER — Other Ambulatory Visit (HOSPITAL_COMMUNITY): Payer: Self-pay

## 2024-12-07 ENCOUNTER — Other Ambulatory Visit (HOSPITAL_COMMUNITY): Payer: Self-pay

## 2024-12-08 ENCOUNTER — Other Ambulatory Visit (HOSPITAL_COMMUNITY): Payer: Self-pay

## 2024-12-14 ENCOUNTER — Other Ambulatory Visit (HOSPITAL_COMMUNITY): Payer: Self-pay

## 2024-12-14 MED ORDER — GABAPENTIN 800 MG PO TABS
800.0000 mg | ORAL_TABLET | Freq: Four times a day (QID) | ORAL | 2 refills | Status: AC
  Filled 2024-12-14: qty 120, 30d supply, fill #0
  Filled 2025-01-03 – 2025-01-11 (×2): qty 120, 30d supply, fill #1
  Filled 2025-01-31: qty 120, 30d supply, fill #2

## 2024-12-14 MED ORDER — NALOXONE HCL 4 MG/0.1ML NA LIQD
NASAL | 0 refills | Status: DC
  Filled 2024-12-14: qty 2, 1d supply, fill #0

## 2024-12-14 MED ORDER — ATORVASTATIN CALCIUM 80 MG PO TABS
80.0000 mg | ORAL_TABLET | Freq: Every day | ORAL | 1 refills | Status: AC
  Filled 2024-12-14: qty 90, 90d supply, fill #0

## 2024-12-14 MED ORDER — HYDROCODONE-ACETAMINOPHEN 10-325 MG PO TABS
ORAL_TABLET | ORAL | 0 refills | Status: AC
  Filled 2024-12-14: qty 150, 30d supply, fill #0

## 2024-12-17 ENCOUNTER — Other Ambulatory Visit: Payer: Self-pay

## 2025-01-03 ENCOUNTER — Other Ambulatory Visit (HOSPITAL_COMMUNITY): Payer: Self-pay

## 2025-01-11 ENCOUNTER — Other Ambulatory Visit (HOSPITAL_COMMUNITY): Payer: Self-pay

## 2025-01-15 ENCOUNTER — Other Ambulatory Visit (HOSPITAL_COMMUNITY): Payer: Self-pay

## 2025-01-15 ENCOUNTER — Other Ambulatory Visit: Payer: Self-pay

## 2025-01-15 MED ORDER — ATORVASTATIN CALCIUM 80 MG PO TABS
80.0000 mg | ORAL_TABLET | Freq: Every day | ORAL | 1 refills | Status: AC
  Filled 2025-01-15 – 2025-01-31 (×2): qty 90, 90d supply, fill #0

## 2025-01-15 MED ORDER — HYDROCODONE-ACETAMINOPHEN 10-325 MG PO TABS
1.0000 | ORAL_TABLET | Freq: Every day | ORAL | 0 refills | Status: AC | PRN
  Filled 2025-01-15 (×2): qty 150, 30d supply, fill #0

## 2025-01-15 MED ORDER — NALOXONE HCL 4 MG/0.1ML NA LIQD
NASAL | 0 refills | Status: AC
  Filled 2025-01-15 – 2025-01-16 (×2): qty 2, 2d supply, fill #0

## 2025-01-16 ENCOUNTER — Other Ambulatory Visit: Payer: Self-pay

## 2025-01-16 ENCOUNTER — Other Ambulatory Visit (HOSPITAL_COMMUNITY): Payer: Self-pay

## 2025-01-17 ENCOUNTER — Other Ambulatory Visit (HOSPITAL_COMMUNITY): Payer: Self-pay

## 2025-01-18 ENCOUNTER — Other Ambulatory Visit: Payer: Self-pay

## 2025-01-18 ENCOUNTER — Other Ambulatory Visit (HOSPITAL_COMMUNITY): Payer: Self-pay

## 2025-01-18 MED ORDER — PREGABALIN 50 MG PO CAPS
50.0000 mg | ORAL_CAPSULE | Freq: Three times a day (TID) | ORAL | 0 refills | Status: AC
  Filled 2025-01-18: qty 90, 30d supply, fill #0

## 2025-01-31 ENCOUNTER — Other Ambulatory Visit (HOSPITAL_COMMUNITY): Payer: Self-pay

## 2025-02-01 ENCOUNTER — Other Ambulatory Visit (HOSPITAL_COMMUNITY): Payer: Self-pay
